# Patient Record
Sex: Male | Born: 1965 | Race: Black or African American | Hispanic: No | Smoking: Never smoker
Health system: Southern US, Community
[De-identification: ages and names within clinical notes are randomized; demographics above are authoritative.]

## PROBLEM LIST (undated history)

## (undated) DIAGNOSIS — N179 Acute kidney failure, unspecified: Secondary | ICD-10-CM

## (undated) DIAGNOSIS — I10 Essential (primary) hypertension: Secondary | ICD-10-CM

## (undated) DIAGNOSIS — Z87442 Personal history of urinary calculi: Secondary | ICD-10-CM

## (undated) DIAGNOSIS — Q2543 Congenital aneurysm of aorta: Secondary | ICD-10-CM

## (undated) DIAGNOSIS — N529 Male erectile dysfunction, unspecified: Secondary | ICD-10-CM

## (undated) DIAGNOSIS — Q278 Other specified congenital malformations of peripheral vascular system: Secondary | ICD-10-CM

## (undated) DIAGNOSIS — R079 Chest pain, unspecified: Secondary | ICD-10-CM

## (undated) DIAGNOSIS — N200 Calculus of kidney: Secondary | ICD-10-CM

## (undated) HISTORY — PX: NO PAST SURGERIES: SHX2092

## (undated) HISTORY — DX: Male erectile dysfunction, unspecified: N52.9

## (undated) HISTORY — PX: ROTATOR CUFF REPAIR: SHX139

## (undated) HISTORY — DX: Chest pain, unspecified: R07.9

## (undated) HISTORY — PX: KNEE ARTHROPLASTY: SHX992

## (undated) HISTORY — DX: Other specified congenital malformations of peripheral vascular system: Q27.8

## (undated) HISTORY — DX: Congenital aneurysm of aorta: Q25.43

---

## 2001-12-01 ENCOUNTER — Emergency Department (HOSPITAL_COMMUNITY): Admission: EM | Admit: 2001-12-01 | Discharge: 2001-12-02 | Payer: Self-pay | Admitting: Emergency Medicine

## 2001-12-02 ENCOUNTER — Encounter: Payer: Self-pay | Admitting: Emergency Medicine

## 2002-05-20 ENCOUNTER — Encounter: Payer: Self-pay | Admitting: Emergency Medicine

## 2002-05-20 ENCOUNTER — Emergency Department (HOSPITAL_COMMUNITY): Admission: EM | Admit: 2002-05-20 | Discharge: 2002-05-20 | Payer: Self-pay | Admitting: Emergency Medicine

## 2003-01-13 ENCOUNTER — Ambulatory Visit (HOSPITAL_COMMUNITY): Admission: RE | Admit: 2003-01-13 | Discharge: 2003-01-13 | Payer: Self-pay | Admitting: Internal Medicine

## 2003-01-13 ENCOUNTER — Encounter: Payer: Self-pay | Admitting: Internal Medicine

## 2004-09-01 ENCOUNTER — Ambulatory Visit (HOSPITAL_COMMUNITY): Admission: RE | Admit: 2004-09-01 | Discharge: 2004-09-01 | Payer: Self-pay | Admitting: Gastroenterology

## 2005-10-20 ENCOUNTER — Emergency Department (HOSPITAL_COMMUNITY): Admission: EM | Admit: 2005-10-20 | Discharge: 2005-10-20 | Payer: Self-pay | Admitting: Family Medicine

## 2012-08-14 ENCOUNTER — Encounter (HOSPITAL_BASED_OUTPATIENT_CLINIC_OR_DEPARTMENT_OTHER): Payer: Self-pay | Admitting: Anesthesiology

## 2012-08-14 ENCOUNTER — Encounter (HOSPITAL_BASED_OUTPATIENT_CLINIC_OR_DEPARTMENT_OTHER): Admission: RE | Payer: Self-pay | Source: Ambulatory Visit

## 2012-08-14 ENCOUNTER — Ambulatory Visit (HOSPITAL_BASED_OUTPATIENT_CLINIC_OR_DEPARTMENT_OTHER): Admission: RE | Admit: 2012-08-14 | Payer: Self-pay | Source: Ambulatory Visit | Admitting: Orthopedic Surgery

## 2012-08-14 SURGERY — EXCISION MASS
Anesthesia: Choice | Laterality: Right

## 2012-10-02 ENCOUNTER — Emergency Department (HOSPITAL_COMMUNITY)
Admission: EM | Admit: 2012-10-02 | Discharge: 2012-10-02 | Disposition: A | Discharge status: Home Health | Payer: BC Managed Care – PPO | Attending: Emergency Medicine | Admitting: Emergency Medicine

## 2012-10-02 ENCOUNTER — Emergency Department (HOSPITAL_COMMUNITY): Payer: BC Managed Care – PPO

## 2012-10-02 ENCOUNTER — Encounter (HOSPITAL_COMMUNITY): Payer: Self-pay | Admitting: Emergency Medicine

## 2012-10-02 DIAGNOSIS — Z87442 Personal history of urinary calculi: Secondary | ICD-10-CM | POA: Insufficient documentation

## 2012-10-02 DIAGNOSIS — Z79899 Other long term (current) drug therapy: Secondary | ICD-10-CM | POA: Insufficient documentation

## 2012-10-02 DIAGNOSIS — I10 Essential (primary) hypertension: Secondary | ICD-10-CM | POA: Insufficient documentation

## 2012-10-02 DIAGNOSIS — R42 Dizziness and giddiness: Secondary | ICD-10-CM | POA: Insufficient documentation

## 2012-10-02 DIAGNOSIS — R300 Dysuria: Secondary | ICD-10-CM | POA: Insufficient documentation

## 2012-10-02 DIAGNOSIS — R110 Nausea: Secondary | ICD-10-CM | POA: Insufficient documentation

## 2012-10-02 DIAGNOSIS — N209 Urinary calculus, unspecified: Secondary | ICD-10-CM

## 2012-10-02 HISTORY — DX: Calculus of kidney: N20.0

## 2012-10-02 HISTORY — DX: Essential (primary) hypertension: I10

## 2012-10-02 LAB — URINALYSIS, ROUTINE W REFLEX MICROSCOPIC
Bilirubin Urine: NEGATIVE
Glucose, UA: NEGATIVE mg/dL
Ketones, ur: NEGATIVE mg/dL
Nitrite: NEGATIVE
Protein, ur: NEGATIVE mg/dL
Specific Gravity, Urine: 1.025 (ref 1.005–1.030)
Urobilinogen, UA: 0.2 mg/dL (ref 0.0–1.0)
pH: 5.5 (ref 5.0–8.0)

## 2012-10-02 LAB — URINE MICROSCOPIC-ADD ON

## 2012-10-02 MED ORDER — ONDANSETRON HCL 8 MG PO TABS
4.0000 mg | ORAL_TABLET | Freq: Once | ORAL | Status: AC
  Administered 2012-10-02: 4 mg via ORAL
  Filled 2012-10-02: qty 2

## 2012-10-02 MED ORDER — KETOROLAC TROMETHAMINE 30 MG/ML IJ SOLN
30.0000 mg | Freq: Once | INTRAMUSCULAR | Status: AC
  Administered 2012-10-02: 30 mg via INTRAVENOUS

## 2012-10-02 MED ORDER — HYDROMORPHONE HCL PF 2 MG/ML IJ SOLN
2.0000 mg | Freq: Once | INTRAMUSCULAR | Status: DC
  Filled 2012-10-02: qty 1

## 2012-10-02 MED ORDER — IBUPROFEN 600 MG PO TABS: 600.0000 mg | ORAL_TABLET | Freq: Four times a day (QID) | ORAL | Status: DC | PRN

## 2012-10-02 MED ORDER — KETOROLAC TROMETHAMINE 30 MG/ML IJ SOLN
INTRAMUSCULAR | Status: AC
  Filled 2012-10-02: qty 1

## 2012-10-02 MED ORDER — IBUPROFEN 200 MG PO TABS
600.0000 mg | ORAL_TABLET | Freq: Once | ORAL | Status: AC
  Administered 2012-10-02: 600 mg via ORAL
  Filled 2012-10-02: qty 3

## 2012-10-02 MED ORDER — TAMSULOSIN HCL 0.4 MG PO CAPS: 0.4000 mg | ORAL_CAPSULE | Freq: Every day | ORAL | Status: DC

## 2012-10-02 MED ORDER — SODIUM CHLORIDE 0.9 % IV SOLN
INTRAVENOUS | Status: DC
  Administered 2012-10-02 (×2): via INTRAVENOUS

## 2012-10-02 MED ORDER — HYDROCODONE-ACETAMINOPHEN 5-325 MG PO TABS: 2.0000 | ORAL_TABLET | ORAL | Status: DC | PRN

## 2012-10-02 MED ORDER — CEPHALEXIN 500 MG PO CAPS: 500.0000 mg | ORAL_CAPSULE | Freq: Two times a day (BID) | ORAL | Status: DC

## 2012-10-02 MED ORDER — HYDROMORPHONE HCL PF 2 MG/ML IJ SOLN
2.0000 mg | Freq: Once | INTRAMUSCULAR | Status: AC
  Administered 2012-10-02: 2 mg via INTRAVENOUS
  Filled 2012-10-02: qty 1

## 2012-10-02 MED ORDER — HYDROCODONE-ACETAMINOPHEN 5-325 MG PO TABS
2.0000 | ORAL_TABLET | Freq: Once | ORAL | Status: AC
  Administered 2012-10-02: 2 via ORAL
  Filled 2012-10-02: qty 2

## 2012-10-02 NOTE — Initial Assessments (Signed)
1:08 PM Screening exam:  Pt with flank pain and hx kidney stone.  Rx Toradol 30 mg and Dilaudid 2 mg IV.  UA, CT of abd/pelvis without contrast ordered.

## 2012-10-02 NOTE — ED Notes (Signed)
Left flank pain x 1 hour feels nauseaous hurts to void

## 2012-10-02 NOTE — ED Notes (Signed)
Hx of kidney stone

## 2012-10-02 NOTE — ED Provider Notes (Signed)
History     CSN: [id number]  Arrival date & time 10/02/12  1237   First MD Initiated Contact with Patient 10/02/12 1342      No chief complaint on file.   (Consider location/radiation/quality/duration/timing/severity/associated sxs/prior treatment) HPI Comments: Pt comes in with cc of abd pain. Pt has hx of renal stones, and states that he started having acute pain on his left flank this afternoon, which has now radiated to the left groin. Pt had some nausea, no emesis, no fevers, chills, and MAY BE some dysuria without hematuria, polyuria. Pt has hx of renal stones in the past that he passed, similar complains at that time.  The history is provided by the patient.    Past Medical History  Diagnosis Date  . Kidney stone   . Hypertension     No past surgical history on file.  No family history on file.  History  Substance Use Topics  . Smoking status: Never Smoker   . Smokeless tobacco: Not on file  . Alcohol Use: Yes      Review of Systems  Constitutional: Negative for fever, chills and activity change.  HENT: Negative for neck pain.   Eyes: Negative for visual disturbance.  Respiratory: Negative for cough, chest tightness and shortness of breath.   Cardiovascular: Negative for chest pain.  Gastrointestinal: Positive for abdominal pain. Negative for abdominal distention.  Genitourinary: Positive for dysuria. Negative for enuresis and difficulty urinating.  Musculoskeletal: Negative for arthralgias.  Neurological: Positive for dizziness. Negative for light-headedness and headaches.  Psychiatric/Behavioral: Negative for confusion.    Allergies  Sulfa antibiotics  Home Medications   Current Outpatient Rx  Name  Route  Sig  Dispense  Refill  . AMLODIPINE BESYLATE 10 MG PO TABS   Oral   Take 10 mg by mouth daily.         Marland Kitchen LISINOPRIL-HYDROCHLOROTHIAZIDE 20-25 MG PO TABS   Oral   Take 1 tablet by mouth daily.           BP 150/99  Pulse 76  Temp  97.1 F (36.2 C) (Oral)  Resp 18  SpO2 98%  Physical Exam  Nursing note and vitals reviewed. Constitutional: He is oriented to person, place, and time. He appears well-developed.  HENT:  Head: Normocephalic and atraumatic.  Eyes: Conjunctivae normal and EOM are normal. Pupils are equal, round, and reactive to light.  Neck: Normal range of motion. Neck supple.  Cardiovascular: Normal rate, regular rhythm and normal heart sounds.   Pulmonary/Chest: Effort normal and breath sounds normal. No respiratory distress. He has no wheezes.  Abdominal: Soft. Bowel sounds are normal. He exhibits no distension. There is no tenderness. There is no rebound and no guarding.  Neurological: He is alert and oriented to person, place, and time.  Skin: Skin is warm.    ED Course  Procedures (including critical care time)  Labs Reviewed  URINALYSIS, ROUTINE W REFLEX MICROSCOPIC - Abnormal; Notable for the following:    Hgb urine dipstick MODERATE (*)     Leukocytes, UA TRACE (*)     All other components within normal limits  URINE MICROSCOPIC-ADD ON - Abnormal; Notable for the following:    Squamous Epithelial / LPF FEW (*)     Casts HYALINE CASTS (*)     All other components within normal limits  URINE CULTURE   Ct Abdomen Pelvis Wo Contrast  10/02/2012  *RADIOLOGY REPORT*  Clinical Data: Left flank pain and dysuria.  CT ABDOMEN AND PELVIS  WITHOUT CONTRAST  Technique:  Multidetector CT imaging of the abdomen and pelvis was performed following the standard protocol without intravenous contrast.  Comparison: 03/04/2011  Findings: There is eight 2-3 mm stone at the left ureterovesicular junction causing mild left hydroureter nephrosis.  No intrarenal stones.  No renal masses.  The right renal collecting system and ureter are normal.  Clear lung bases.  The heart is normal in size.  Liver, spleen, gallbladder and pancreas are unremarkable.  No adrenal masses.  No adenopathy.  No abnormal fluid collections.   There are a few right colon diverticula without diverticulitis.  The bowel is otherwise unremarkable.  Normal appendix.  Mild degenerative changes of the lumbar spine.  No other bony abnormality.  IMPRESSION: 2-3 mm stone at the left ureterovesicular junction causing mild left hydroureteronephrosis.   Original Report Authenticated By: Amie Portland, M.D.      No diagnosis found.    MDM  Pt comes in with cc of acute left flank pain that has moved to the groin. No scrotal tenderness. Has hx of similar sx in the past with renal stones.  Pt has some dysuria, so we will check UA, and CT renal stones.  3:15 PM UA is not showing clear infection, renal stone measures 2mm. We will give a Urology followup. With the dysuria complains, and the fact that [place] is 80-85% sensitive, cultures has been send, and we will give him a 5 day keflex dose just to be safe.          Derwood Kaplan, MD 10/02/12 [phone]

## 2012-10-03 LAB — URINE CULTURE: Colony Count: 40000

## 2012-10-04 NOTE — ED Notes (Signed)
+   Urine Patient treated with Keflex-sensitive to same-chart sent to EDP Office for review.

## 2013-07-18 ENCOUNTER — Encounter (HOSPITAL_COMMUNITY): Payer: Self-pay | Admitting: Emergency Medicine

## 2013-07-18 ENCOUNTER — Emergency Department (HOSPITAL_COMMUNITY)
Admission: EM | Admit: 2013-07-18 | Discharge: 2013-07-19 | Disposition: A | Discharge status: Home / Self Care | Payer: BC Managed Care – PPO | Attending: Emergency Medicine | Admitting: Emergency Medicine

## 2013-07-18 ENCOUNTER — Emergency Department (HOSPITAL_COMMUNITY): Payer: BC Managed Care – PPO

## 2013-07-18 DIAGNOSIS — Z87442 Personal history of urinary calculi: Secondary | ICD-10-CM | POA: Insufficient documentation

## 2013-07-18 DIAGNOSIS — R079 Chest pain, unspecified: Secondary | ICD-10-CM

## 2013-07-18 DIAGNOSIS — I10 Essential (primary) hypertension: Secondary | ICD-10-CM | POA: Insufficient documentation

## 2013-07-18 DIAGNOSIS — Z79899 Other long term (current) drug therapy: Secondary | ICD-10-CM | POA: Insufficient documentation

## 2013-07-18 LAB — BASIC METABOLIC PANEL
BUN: 18 mg/dL (ref 6–23)
CO2: 24 mEq/L (ref 19–32)
Calcium: 9 mg/dL (ref 8.4–10.5)
Chloride: 105 mEq/L (ref 96–112)
Creatinine, Ser: 1.49 mg/dL — ABNORMAL HIGH (ref 0.50–1.35)
Glucose, Bld: 91 mg/dL (ref 70–99)

## 2013-07-18 LAB — CBC
HCT: 46.4 % (ref 39.0–52.0)
MCH: 30.6 pg (ref 26.0–34.0)
MCV: 85.1 fL (ref 78.0–100.0)
Platelets: 201 10*3/uL (ref 150–400)
RBC: 5.45 MIL/uL (ref 4.22–5.81)
WBC: 3.3 10*3/uL — ABNORMAL LOW (ref 4.0–10.5)

## 2013-07-18 LAB — POCT I-STAT TROPONIN I: Troponin i, poc: 0.02 ng/mL (ref 0.00–0.08)

## 2013-07-18 NOTE — ED Notes (Signed)
Onset today 2100 while driving and developed sudden onset of left side of chest pain 4/10 achy EMS called gave 2 nitrol SL and aspirin 324mg  PO.  Pain improved to 2/10 achy. Denies shortness of breath.

## 2013-07-18 NOTE — ED Provider Notes (Signed)
CSN: [id number]     Arrival date & time 07/18/13  2135 History   First MD Initiated Contact with Patient 07/18/13 2159     Chief Complaint  Patient presents with  . Chest Pain   (Consider location/radiation/quality/duration/timing/severity/associated sxs/prior Treatment) HPI Comments: Patient is a 47 year old male with a past medical history of hypertension who presents with chest pain that occurred prior to arrival. Patient reports driving in his car and having sudden onset of left chest pain that did not radiate. The pain was aching and severe. The pain lasted about 10 minutes before spontaneously resolving. Patient denies any associated symptoms during the chest pain episode. Patient has no family history of early heart disease. Patient is a non smoker. No previous DVT/PE, recent travel or surgery.    Past Medical History  Diagnosis Date  . Kidney stone   . Hypertension    History reviewed. No pertinent past surgical history. No family history on file. History  Substance Use Topics  . Smoking status: Never Smoker   . Smokeless tobacco: Not on file  . Alcohol Use: Yes    Review of Systems  Cardiovascular: Positive for chest pain.  All other systems reviewed and are negative.    Allergies  Sulfa antibiotics  Home Medications   Current Outpatient Rx  Name  Route  Sig  Dispense  Refill  . amLODipine (NORVASC) 10 MG tablet   Oral   Take 10 mg by mouth daily.         Marland Kitchen ibuprofen (ADVIL,MOTRIN) 200 MG tablet   Oral   Take 400 mg by mouth every 6 (six) hours as needed for pain.         Marland Kitchen lisinopril-hydrochlorothiazide (PRINZIDE,ZESTORETIC) 20-25 MG per tablet   Oral   Take 1 tablet by mouth daily.         . Tetrahydrozoline HCl (VISINE OP)   Both Eyes   Place 2 drops into both eyes as needed (dry eyes).          BP 122/78  Pulse 74  Temp(Src) 97.6 F (36.4 C) (Oral)  Resp 16  SpO2 96% Physical Exam  Nursing note and vitals reviewed. Constitutional: He  is oriented to person, place, and time. He appears well-developed and well-nourished. No distress.  HENT:  Head: Normocephalic and atraumatic.  Eyes: Conjunctivae and EOM are normal.  Neck: Normal range of motion.  Cardiovascular: Normal rate and regular rhythm.  Exam reveals no gallop and no friction rub.   No murmur heard. Pulmonary/Chest: Effort normal and breath sounds normal. He has no wheezes. He has no rales. He exhibits no tenderness.  Abdominal: Soft. He exhibits no distension. There is no tenderness. There is no rebound and no guarding.  Musculoskeletal: Normal range of motion.  Neurological: He is alert and oriented to person, place, and time. Coordination normal.  Speech is goal-oriented. Moves limbs without ataxia.   Skin: Skin is warm and dry.  Psychiatric: He has a normal mood and affect. His behavior is normal.    ED Course  Procedures (including critical care time)   Date: 07/18/2013  Rate: 78  Rhythm: normal sinus rhythm  QRS Axis: normal  Intervals: PR prolonged  ST/T Wave abnormalities: nonspecific T wave changes  Conduction Disutrbances:none  Narrative Interpretation: NSR with nonspecific T wave changes  Old EKG Reviewed: none available    Labs Review Labs Reviewed  CBC - Abnormal; Notable for the following:    WBC 3.3 (*)  All other components within normal limits  BASIC METABOLIC PANEL - Abnormal; Notable for the following:    Potassium 3.2 (*)    Creatinine, Ser 1.49 (*)    GFR calc non Af Amer 54 (*)    GFR calc Af Amer 62 (*)    All other components within normal limits  POCT I-STAT TROPONIN I   Imaging Review Dg Chest Port 1 View  07/18/2013   *RADIOLOGY REPORT*  Clinical Data: Chest pain  PORTABLE CHEST - 1 VIEW  Comparison: None available  Findings: Cardiac and mediastinal silhouettes are within normal limits.  The lungs are clear without airspace consolidation, pleural effusion, or pulmonary edema.  Costophrenic angles incompletely  visualized.  Mild irregular apical thickening is present right lung apex.  No pulmonary nodule or mass.  No pneumothorax.  No acute osseous abnormality.  IMPRESSION: No acute cardiopulmonary process.   Original Report Authenticated By: Rise Mu, M.D.    MDM   1. Chest pain     10:55 PM Labs unremarkable. Troponin negative. Chest xray unremarkable. Vitals stable and patient afebrile. EKG shows nonspecific T wave changes. Patient will have delta troponin and I will consult [facility]. Patient is PERC negative. Dissection not likely.   12:07 AM I spoke with the on call Cardiologist who is comfortable with a repeat troponin and follow up in the office. Patient will have troponin repeated and if negative, patient will be discharged. If positive, patient will stay.   1:03 AM Second troponin negative. Patient will be discharged.     Emilia Beck, PA-C 07/19/13 [phone]

## 2013-07-19 LAB — POCT I-STAT TROPONIN I

## 2013-07-20 ENCOUNTER — Other Ambulatory Visit: Payer: Self-pay | Admitting: Interventional Cardiology

## 2013-07-20 ENCOUNTER — Ambulatory Visit
Admission: RE | Admit: 2013-07-20 | Discharge: 2013-07-20 | Disposition: A | Discharge status: Home / Self Care | Payer: BC Managed Care – PPO | Source: Ambulatory Visit | Attending: Interventional Cardiology | Admitting: Interventional Cardiology

## 2013-07-20 DIAGNOSIS — I7100 Dissection of unspecified site of aorta: Secondary | ICD-10-CM

## 2013-07-20 DIAGNOSIS — I2510 Atherosclerotic heart disease of native coronary artery without angina pectoris: Secondary | ICD-10-CM

## 2013-07-20 MED ORDER — IOHEXOL 350 MG/ML SOLN
100.0000 mL | Freq: Once | INTRAVENOUS | Status: AC | PRN
  Administered 2013-07-20: 100 mL via INTRAVENOUS

## 2013-07-21 NOTE — ED Provider Notes (Signed)
Medical screening examination/treatment/procedure(s) were performed by non-physician practitioner and as supervising physician I was immediately available for consultation/collaboration.  Candyce Churn, MD 07/21/13 [phone]

## 2013-08-14 ENCOUNTER — Encounter: Payer: Self-pay | Admitting: *Deleted

## 2013-08-14 DIAGNOSIS — R079 Chest pain, unspecified: Secondary | ICD-10-CM | POA: Insufficient documentation

## 2013-08-14 DIAGNOSIS — Q2543 Congenital aneurysm of aorta: Secondary | ICD-10-CM | POA: Insufficient documentation

## 2013-08-14 DIAGNOSIS — Q278 Other specified congenital malformations of peripheral vascular system: Secondary | ICD-10-CM | POA: Insufficient documentation

## 2013-08-14 DIAGNOSIS — I10 Essential (primary) hypertension: Secondary | ICD-10-CM | POA: Insufficient documentation

## 2013-08-14 DIAGNOSIS — N200 Calculus of kidney: Secondary | ICD-10-CM | POA: Insufficient documentation

## 2013-08-14 DIAGNOSIS — N529 Male erectile dysfunction, unspecified: Secondary | ICD-10-CM | POA: Insufficient documentation

## 2013-08-15 ENCOUNTER — Encounter: Payer: Self-pay | Admitting: Surgery

## 2013-08-15 ENCOUNTER — Institutional Professional Consult (permissible substitution) (INDEPENDENT_AMBULATORY_CARE_PROVIDER_SITE_OTHER): Payer: BC Managed Care – PPO | Admitting: Surgery

## 2013-08-15 VITALS — BP 140/93 | HR 96 | Resp 20 | Ht 71.0 in | Wt 244.0 lb

## 2013-08-15 DIAGNOSIS — I77810 Thoracic aortic ectasia: Secondary | ICD-10-CM

## 2013-08-17 ENCOUNTER — Encounter: Payer: Self-pay | Admitting: Surgery

## 2013-08-17 NOTE — Progress Notes (Signed)
[address]       [place] [address]             [phone]      Cardiothoracic Surgery Consultation  PCP is Lupe Carney, MD Referring Provider is Verdis Prime, MD   Chief Complaint  Patient presents with  . Thoracic Aortic Aneurysm    Surgical eval on moderate aortic root dilation, Chest CTA 07/20/13, ECHO 08/10/13     HPI:  The patient is a generally healthy, active 47 year old with HTN who reports driving in his car and having sudden sharp pain in his left upper chest. It became more severe and lasted about 10 minutes and resolved. There was no radiation and no associated symptoms. His wife made him go to the ER. ECG and troponin x 2 were negative. He was referred to Dr. Katrinka Blazing for followup. A CTA of the chest showed dilatation of the aortic root with a sinus of valsalva diameter of 48 mm. 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  He had an echo on 08/10/2013 that showed mild dilatation of the aortic sinuses to 46 mm with normal tricuspid aortic valve function and normal LV. An echo in 2012 reportedly showed a sinus diameter of 46 mm. The patient says he has had no further episodes of this pain but has not been exercising since being told about this.  Past Medical History  Diagnosis Date  . Erectile dysfunction     DR. NESI  . Hypertension   . Kidney stone   . Chest pain CT ANGIO CHEST 07/20/13    LED TO DISCOVERY OF SINUS OF VALSALVA  ANEURYSM  . 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 Aberrant subclavian artery     RIGHT...PER CT CHEST...07/20/13    History reviewed. No pertinent past surgical history.  Family History  Problem Relation Age of Onset  . Cancer Mother     BREAST CANCER  . Cancer Father     MUTIPLE MYELOMA  . Hypertension Father   . Cancer Brother     ? TYPE    Social History History  Substance Use Topics  . Smoking status: Never Smoker   .  Smokeless tobacco: Not on file  . Alcohol Use: Yes     Comment: RARE    Current Outpatient Prescriptions  Medication Sig Dispense Refill  . amLODipine (NORVASC) 10 MG tablet Take 10 mg by mouth daily.      Marland Kitchen aspirin EC 81 MG tablet Take 81 mg by mouth daily.      Marland Kitchen ibuprofen (ADVIL,MOTRIN) 200 MG tablet Take 400 mg by mouth every 6 (six) hours as needed for pain.      Marland Kitchen lisinopril-hydrochlorothiazide (PRINZIDE,ZESTORETIC) 20-25 MG per tablet Take 1 tablet by mouth daily.      . tadalafil (CIALIS) 20 MG tablet Take 20 mg by mouth daily as needed for erectile dysfunction.      . Tetrahydrozoline HCl (VISINE OP) Place 2 drops into both eyes as needed (dry eyes).       No current facility-administered medications for this visit.    Allergies  Allergen Reactions  . Sulfa Antibiotics Rash  . Viagra [Sildenafil Citrate] Other (See Comments)    FLUSHING     Review of Systems  Constitutional: Negative.   HENT: Negative.   Eyes: Negative.   Respiratory: Negative.   Cardiovascular: Positive  for chest pain.       Only one episode as noted above.  Gastrointestinal: Negative.   Endocrine: Negative.   Genitourinary: Negative.   Musculoskeletal: Negative.   Skin: Negative.   Allergic/Immunologic: Negative.   Neurological: Negative.   Hematological: Negative.   Psychiatric/Behavioral: Negative.     BP 140/93  Pulse 96  Resp 20  Ht 5\' 11"  (1.803 m)  Wt 244 lb (110.678 kg)  BMI 34.05 kg/m2  SpO2 98% Physical Exam  Constitutional: He is oriented to person, place, and time. He appears well-developed and well-nourished. No distress.  HENT:  Head: Normocephalic and atraumatic.  Mouth/Throat: Oropharynx is clear and moist.  Eyes: Conjunctivae and EOM are normal. Pupils are equal, round, and reactive to light.  Neck: Normal range of motion. Neck supple. No JVD present. No tracheal deviation present. No thyromegaly present.  Cardiovascular: Normal rate, regular rhythm and intact distal  pulses.  Exam reveals no gallop and no friction rub.   No murmur heard. Pulmonary/Chest: Effort normal and breath sounds normal. No respiratory distress. He has no rales.  Abdominal: Soft. Bowel sounds are normal. He exhibits no distension and no mass. There is no tenderness.  Musculoskeletal: Normal range of motion. He exhibits no edema.  Lymphadenopathy:    He has no cervical adenopathy.  Neurological: He is alert and oriented to person, place, and time. He has normal strength. No cranial nerve deficit or sensory deficit.  Skin: Skin is warm and dry.  Psychiatric: He has a normal mood and affect.     Diagnostic Tests:  Wynn Banker, MD Caleen Essex Jul 20, 2013 1:57:51 PM EDT       **ADDENDUM** CREATED: 07/20/2013 13:54:04  IMPRESSION:  1. Sinus of Valsalva aneurysm measuring 48 mm. Echocardiography  recommended to further assess competency of the aortic valve. The  thoracic aorta is tortuous but not aneurysmally dilated and this  tortuosity can be associated with aortic insufficiency.  2. No coronary artery calcium identified grossly.  3. 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  **END ADDENDUM** SIGNED BY: Andreas Newport, M.D.      Study Result    *RADIOLOGY REPORT*  Clinical Data: Severe chest pain. Coronary artery calcification.  Thoracic aortic aneurysm or dissection.  CT ANGIOGRAPHY CHEST  Technique: Multidetector CT imaging of the chest using the  standard protocol during bolus administration of intravenous  contrast. Multiplanar reconstructed images including MIPs were  obtained and reviewed to evaluate the vascular anatomy.  Contrast: OMNIPAQUE IOHEXOL 350 MG/ML SOLN  Comparison: Chest radiograph 07/19/2003 team.  Findings: Sinus Valsalva aneurysm is present measuring 48 mm on  coronal imaging. There is a three-vessel aortic arch with an  aberrant right subclavian artery passing retro  esophageal. 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 This is aberrant vein extends from the left renal vein,  mirroring the azygos vein in the mediastinum and connects with the  left brachiocephalic vein. There is a left brachiocephalic vein  proper which appears patent with mass effect from the tortuous  thoracic aortic arch.  There is no gross central pulmonary embolus. No ascending or  descending thoracic aortic aneurysm. No pericardial or pleural  effusion. Incidental imaging of the upper abdomen is within normal  limits aside from a tiny low density lesion in the inferior right  hepatic lobe probably representing cysts. Heart size is within  normal limits. There is no axillary  adenopathy. No mediastinal or  hilar adenopathy. The lung windows show scattered areas of  atelectasis. No focal consolidation. Central airways are patent.  Mild thoracic spondylosis. No aggressive osseous lesions.  IMPRESSION:  1. Sinus of Valsalva aneurysm measuring 48 mm. Echocardiography  recommended to further assess competency of the aortic valve. The  thoracic aorta is tortuous but not aneurysmally dilated and this  tortuosity can be associated with aortic insufficiency.  2. No coronary artery calcium identified grossly.  3. 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  Original Report Authenticated By: Andreas Newport, M.D.    Impression:  He has mild dilatation of the aortic root which does not look significant given the patient's large frame and the size of the remaining aorta. He has a normal tricuspid aortic valve and no family hx of valve or aortic disease. I would recommend continued followup with a CTA in 1 year. I don't think that his chest pain had anything to do with this. I reviewed the echo and CT scan with him and answered all of his questions. I discussed the importance of good  blood pressure control. He would probably benefit from a B-Blocker if tolerated.  Plan:  He will return to see me in 1 year with a CTA of the chest.

## 2013-09-17 ENCOUNTER — Encounter: Payer: Self-pay | Admitting: Interventional Cardiology

## 2013-11-27 ENCOUNTER — Ambulatory Visit: Payer: BC Managed Care – PPO | Admitting: Interventional Cardiology

## 2013-12-25 ENCOUNTER — Encounter: Payer: Self-pay | Admitting: Interventional Cardiology

## 2013-12-25 ENCOUNTER — Ambulatory Visit (INDEPENDENT_AMBULATORY_CARE_PROVIDER_SITE_OTHER): Payer: BC Managed Care – PPO | Admitting: Interventional Cardiology

## 2013-12-25 VITALS — BP 158/108 | HR 105 | Ht 72.0 in | Wt 256.0 lb

## 2013-12-25 DIAGNOSIS — Q2543 Congenital aneurysm of aorta: Secondary | ICD-10-CM

## 2013-12-25 DIAGNOSIS — I10 Essential (primary) hypertension: Secondary | ICD-10-CM

## 2013-12-25 DIAGNOSIS — R079 Chest pain, unspecified: Secondary | ICD-10-CM

## 2013-12-25 DIAGNOSIS — Q254 Other congenital malformations of aorta: Secondary | ICD-10-CM

## 2013-12-25 MED ORDER — LISINOPRIL-HYDROCHLOROTHIAZIDE 20-25 MG PO TABS: 1.0000 | ORAL_TABLET | Freq: Every day | ORAL | Status: DC

## 2013-12-25 MED ORDER — METOPROLOL SUCCINATE ER 25 MG PO TB24: 25.0000 mg | ORAL_TABLET | Freq: Every day | ORAL | Status: DC

## 2013-12-25 MED ORDER — AMLODIPINE BESYLATE 10 MG PO TABS: 10.0000 mg | ORAL_TABLET | Freq: Every day | ORAL | Status: DC

## 2013-12-25 NOTE — Patient Instructions (Signed)
Your physician has recommended you make the following change in your medication:   1) Start Metoprolol Succinate 25mg  daily. An Rx has been sent to your pharmacy  Take all other medications as prescribed. All you cardiac related medications have been refilled today  Your physician wants you to follow-up in: 6-8 months You will receive a reminder letter in the mail two months in advance. If you don't receive a letter, please call our office to schedule the follow-up appointment.

## 2013-12-25 NOTE — Progress Notes (Signed)
Patient ID: Bryan Riggs Nemmers, male   DOB: 11-21-1965, 48 y.o.   MRN: [id number]    [address] [place], [place]  [id number] Phone: [phone] Fax:  [phone]  Date:  12/25/2013   [name] [place], DOB 11-21-1965, MRN [id number]  PCP:  Lupe CarneyMITCHELL,DEAN, MD   ASSESSMENT:  1. 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 Elevated blood pressure with poor control. Has missed several days of therapy 3. Erectile dysfunction  PLAN:  1. Metoprolol succinate 25 mg daily added to his current medical regimen. All antihypertensive prescriptions are refilled for one year 2. Clinical followup in 6 months 3. Eliminate isometric exercise but cleared to engage in aerobic activity   SUBJECTIVE: Bryan Riggs Y[place] is a 48 y.o. male complaining of occasional chest discomfort that is fleeting. The discomfort is nonexertional. There is some shortness of breath that he feels is related to weight gain and deconditioning. Since finding out about the enlargement of the aorta, he has been concerned about exercising.   Wt Readings from Last 3 Encounters:  12/25/13 256 lb (116.121 kg)  08/15/13 244 lb (110.678 kg)     Past Medical History  Diagnosis Date  . Erectile dysfunction     DR. NESI  . Hypertension   . Kidney stone   . Chest pain CT ANGIO CHEST 07/20/13    LED TO DISCOVERY OF SINUS OF VALSALVA  ANEURYSM  . 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 Aberrant subclavian artery     RIGHT...PER CT CHEST...07/20/13    Current Outpatient Prescriptions  Medication Sig Dispense Refill  . amLODipine (NORVASC) 10 MG tablet Take 10 mg by mouth daily.      Marland Kitchen. lisinopril-hydrochlorothiazide (PRINZIDE,ZESTORETIC) 20-25 MG per tablet Take 1 tablet by mouth daily.      . Tetrahydrozoline HCl (VISINE OP) Place 2 drops into both eyes as needed (dry eyes).       No current  facility-administered medications for this visit.    Allergies:    Allergies  Allergen Reactions  . Sulfa Antibiotics Rash    Break out, itching  . Viagra [Sildenafil Citrate] Other (See Comments)    FLUSHING     Social History:  The patient  reports that he has never smoked. He does not have any smokeless tobacco history on file. He reports that he drinks alcohol.   ROS:  Please see the history of present illness.   He wrecked out dysfunction makes him leery of taking antihypertensive therapy. He has not had his blood pressure medicine for 2 days at this point. He is been frightened of exercising. He has gained significant weight.   All other systems reviewed and negative.   OBJECTIVE: VS:  BP 158/108  Pulse 105  Ht 6' (1.829 m)  Wt 256 lb (116.121 kg)  BMI 34.71 kg/m2 Well nourished, well developed, in no acute distress, younger than stated age HEENT: normal Neck: JVD flat. Carotid bruit absent  Cardiac:  normal S1, S2; RRR; no murmur Lungs:  clear to auscultation bilaterally, no wheezing, rhonchi or rales Abd: soft, nontender, no hepatomegaly Ext: Edema absent. Pulses 2+ and symmetric Skin: warm and dry Neuro:  CNs 2-12 intact, no focal abnormalities noted  EKG:  Not repeated       Signed, Darci NeedleHenry W. B. Masae Lukacs III, MD 12/25/2013 10:54 AM

## 2014-01-02 ENCOUNTER — Telehealth: Payer: Self-pay | Admitting: *Deleted

## 2014-01-02 DIAGNOSIS — N529 Male erectile dysfunction, unspecified: Secondary | ICD-10-CM

## 2014-01-02 NOTE — Telephone Encounter (Signed)
Viagra 100 mg tabs, # 15 with 12 refills

## 2014-01-02 NOTE — Telephone Encounter (Signed)
Patient requests viagra refill. He stated that Dr Katrinka BlazingSmith told him that if he needed any to just give him a call. Please advise. Thanks, MI

## 2014-01-03 MED ORDER — SILDENAFIL CITRATE 100 MG PO TABS: 100.0000 mg | ORAL_TABLET | Freq: Every day | ORAL | Status: DC | PRN

## 2014-01-03 NOTE — Telephone Encounter (Signed)
Done

## 2014-01-30 ENCOUNTER — Encounter: Payer: Self-pay | Admitting: Interventional Cardiology

## 2014-05-20 ENCOUNTER — Telehealth: Payer: Self-pay | Admitting: *Deleted

## 2014-05-20 NOTE — Telephone Encounter (Signed)
[place] [employer] viagra

## 2014-07-16 ENCOUNTER — Ambulatory Visit: Payer: BC Managed Care – PPO | Admitting: Interventional Cardiology

## 2014-07-24 ENCOUNTER — Encounter: Payer: Self-pay | Admitting: Interventional Cardiology

## 2014-07-24 ENCOUNTER — Ambulatory Visit (INDEPENDENT_AMBULATORY_CARE_PROVIDER_SITE_OTHER): Payer: BC Managed Care – PPO | Admitting: Interventional Cardiology

## 2014-07-24 VITALS — BP 150/110 | HR 76 | Ht 72.0 in | Wt 259.0 lb

## 2014-07-24 DIAGNOSIS — I10 Essential (primary) hypertension: Secondary | ICD-10-CM

## 2014-07-24 DIAGNOSIS — Q278 Other specified congenital malformations of peripheral vascular system: Secondary | ICD-10-CM

## 2014-07-24 DIAGNOSIS — Q254 Other congenital malformations of aorta: Secondary | ICD-10-CM

## 2014-07-24 DIAGNOSIS — Q2543 Congenital aneurysm of aorta: Secondary | ICD-10-CM

## 2014-07-24 MED ORDER — METOPROLOL SUCCINATE ER 100 MG PO TB24: ORAL_TABLET | ORAL | Status: DC

## 2014-07-24 NOTE — Progress Notes (Addendum)
Patient ID: Bryan Riggs, male   DOB: 1966-01-28, 48 y.o.   MRN: [id number]    [address], [address] [place], [place]  [address] Phone: [phone] Fax:  [phone]  Date:  07/24/2014   [name], DOB 1966-11-09, MRN [id number]  PCP:  Bryan Carney, MD   ASSESSMENT:  1. sinus of Valsalva aneurysm, clinically being followed 2. Severe and poorly controlled blood pressure 3. Recurring left chest pain, nonexertional  PLAN:  1. increase metoprolol succinate 50 mg daily. 2. Record blood pressures at home and if pressures remain above 150/90, will increase metoprolol to 100 mg per day after 7-10 days of 50 mg per day. 3. If blood pressure remains high after optimizing beta blocker therapy, we will increase lisinopril HCT to 40/25 mg per day, in the form of 20/12.5 mg tablets twice a day 4. Clinical followup in 2-3 months   SUBJECTIVE: Bryan Riggs is a 48 y.o. male feels relatively well. He has not been exercising. He has a sense of impending doom. States he has taken his blood pressure medications but always as prescribed. He is having intermittent aching in his chest. This is similar to that which led to aortic evaluation a year ago. The discomfort is nonexertional. He denies orthopnea PND.   Wt Readings from Last 3 Encounters:  07/24/14 259 lb (117.482 kg)  12/25/13 256 lb (116.121 kg)  08/15/13 244 lb (110.678 kg)     Past Medical History  Diagnosis Date  . Erectile dysfunction     DR. NESI  . Hypertension   . Kidney stone   . Chest pain CT ANGIO CHEST 07/20/13    LED TO DISCOVERY OF SINUS OF VALSALVA  ANEURYSM  . 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 Aberrant subclavian artery     RIGHT...PER CT CHEST...07/20/13    Current Outpatient Prescriptions  Medication Sig Dispense Refill  . amLODipine (NORVASC) 10 MG tablet Take 1 tablet (10 mg total) by mouth daily.  30 tablet  11  .  lisinopril-hydrochlorothiazide (PRINZIDE,ZESTORETIC) 20-25 MG per tablet Take 1 tablet by mouth daily.  30 tablet  11  . metoprolol succinate (TOPROL XL) 25 MG 24 hr tablet Take 1 tablet (25 mg total) by mouth daily.  30 tablet  11  . sildenafil (VIAGRA) 100 MG tablet Take 1 tablet (100 mg total) by mouth daily as needed for erectile dysfunction.  15 tablet  12  . Tetrahydrozoline HCl (VISINE OP) Place 2 drops into both eyes as needed (dry eyes).       No current facility-administered medications for this visit.    Allergies:    Allergies  Allergen Reactions  . Sulfa Antibiotics Rash and Nausea And Vomiting    Break out, itching  . Viagra [Sildenafil Citrate] Other (See Comments)    FLUSHING     Social History:  The patient  reports that he has never smoked. He does not have any smokeless tobacco history on file. He reports that he drinks alcohol.   ROS:  Please see the history of present illness.   Depressed   All other systems reviewed and negative.   OBJECTIVE: VS:  BP 150/110  Pulse 76  Ht 6' (1.829 m)  Wt 259 lb (117.482 kg)  BMI 35.12 kg/m2 Well nourished, well developed, in no acute distress, he has gained weight. HEENT: normal Neck: JVD flat. Carotid bruit absent  Cardiac:  normal S1, S2; RRR; no murmur Lungs:  clear to auscultation bilaterally, no wheezing, rhonchi or rales Abd: soft, nontender, no hepatomegaly Ext: Edema absent. Pulses 2+ Skin: warm and dry Neuro:  CNs 2-12 intact, no focal abnormalities noted  EKG:  Normal sinus rhythm with first-degree AV block   . Left ventricular hypertrophy. Poor R-wave progression. First degree AV block. Biatrial abnormality. Left axis deviation.    Signed, Darci Needle III, MD 07/24/2014 2:16 PM

## 2014-07-24 NOTE — Patient Instructions (Signed)
Your physician has recommended you make the following change in your medication:  1) INCREASE Metoprolol to  daily  Monitor your blood pressure at home for 10 days. Call the office with bp readings [phone]  Your physician recommends that you schedule a follow-up appointment in: 3 months

## 2014-08-06 ENCOUNTER — Other Ambulatory Visit: Payer: Self-pay | Admitting: *Deleted

## 2014-08-06 DIAGNOSIS — I77810 Thoracic aortic ectasia: Secondary | ICD-10-CM

## 2014-09-04 ENCOUNTER — Ambulatory Visit: Payer: BC Managed Care – PPO | Admitting: Surgery

## 2014-09-10 ENCOUNTER — Other Ambulatory Visit: Payer: Self-pay | Admitting: *Deleted

## 2014-09-10 DIAGNOSIS — Q2543 Congenital aneurysm of aorta: Secondary | ICD-10-CM

## 2014-09-10 LAB — CREATININE, SERUM: Creat: 1.5 mg/dL — ABNORMAL HIGH (ref 0.50–1.35)

## 2014-09-10 LAB — BUN: BUN: 17 mg/dL (ref 6–23)

## 2014-09-11 ENCOUNTER — Ambulatory Visit
Admission: RE | Admit: 2014-09-11 | Discharge: 2014-09-11 | Disposition: A | Discharge status: Home / Self Care | Payer: No Typology Code available for payment source | Source: Ambulatory Visit | Attending: Surgery | Admitting: Surgery

## 2014-09-11 ENCOUNTER — Encounter: Payer: Self-pay | Admitting: Surgery

## 2014-09-11 ENCOUNTER — Ambulatory Visit (INDEPENDENT_AMBULATORY_CARE_PROVIDER_SITE_OTHER): Payer: BC Managed Care – PPO | Admitting: Surgery

## 2014-09-11 VITALS — BP 126/91 | HR 66 | Ht 72.0 in | Wt 244.0 lb

## 2014-09-11 DIAGNOSIS — Q254 Other congenital malformations of aorta: Secondary | ICD-10-CM

## 2014-09-11 DIAGNOSIS — Q2543 Congenital aneurysm of aorta: Secondary | ICD-10-CM

## 2014-09-11 DIAGNOSIS — I77810 Thoracic aortic ectasia: Secondary | ICD-10-CM

## 2014-09-11 MED ORDER — IOHEXOL 350 MG/ML SOLN
65.0000 mL | Freq: Once | INTRAVENOUS | Status: AC | PRN
Start: 2014-09-11 — End: 2014-09-11
  Administered 2014-09-11: 65 mL via INTRAVENOUS

## 2014-09-13 ENCOUNTER — Encounter: Payer: Self-pay | Admitting: Surgery

## 2014-09-13 NOTE — Progress Notes (Signed)
HPI:  The patient returns today for follow up of an aortic root aneurysm. I saw him one year ago for initial consultation and a CTA of the chest showed dilatation of the aortic root with a sinus of valsalva diameter of 48 mm. 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 He had an echo on 08/10/2013 that showed mild dilatation of the aortic sinuses to 46 mm with normal tricuspid aortic valve function and normal LV. An echo in 2012 reportedly showed a sinus diameter of 46 mm. A CTA last year showed the aortic root to have a diameter of 48 mm at the sinus level. He has not had any significant chest pain or shortness of breath over the past year. He says he does have ocassional fleeting pain in the left chest that only last for a few seconds.   Current Outpatient Prescriptions  Medication Sig Dispense Refill  . amLODipine (NORVASC) 10 MG tablet Take 1 tablet (10 mg total) by mouth daily.  30 tablet  11  . lisinopril-hydrochlorothiazide (PRINZIDE,ZESTORETIC) 20-25 MG per tablet Take 1 tablet by mouth daily.  30 tablet  11  . metoprolol succinate (TOPROL XL) 100 MG 24 hr tablet Take 1/2 tablet (50mg ) Daily  30 tablet  11  . sildenafil (VIAGRA) 100 MG tablet Take 1 tablet (100 mg total) by mouth daily as needed for erectile dysfunction.  15 tablet  12  . Tetrahydrozoline HCl (VISINE OP) Place 2 drops into both eyes as needed (dry eyes).       No current facility-administered medications for this visit.     Physical Exam: BP 126/91  Pulse 66  Ht 6' (1.829 m)  Wt 244 lb (110.678 kg)  BMI 33.09 kg/m2  SpO2 98% He looks well Cardiac exam shows a regular rate and rhythm with normal heart sounds. There is no murmur. Lungs are clear  Diagnostic Tests:  CLINICAL DATA: Thoracic aortic ectasia.  EXAM:  CT ANGIOGRAPHY CHEST WITH CONTRAST  TECHNIQUE:  Multidetector CT imaging of the chest was performed using the  standard protocol during bolus administration of  intravenous  contrast. Multiplanar CT image reconstructions and MIPs were  obtained to evaluate the vascular anatomy.  CONTRAST: 65mL OMNIPAQUE IOHEXOL 350 MG/ML SOLN  COMPARISON: CT scan of July 20, 2013.  FINDINGS:  No pneumothorax or pleural effusion is noted. No acute pulmonary  disease is noted. Stable 3 mm nodule is noted in right middle lobe.  No mediastinal mass or adenopathy is noted. There remains prominent  left-sided hemi azygous vein. Left innominate vein is widely patent.  There is no evidence of thoracic aortic dissection. Visualized  portion of upper abdomen appears normal.  Stable sinus of Valsalva aneurysm is noted measuring 4.8 cm. No  significant osseous abnormality is noted. Aberrant right subclavian  artery is again noted.  Review of the MIP images confirms the above findings.  IMPRESSION:  Stable sinus of Valsalva aneurysm is noted measuring 4.8 cm.  Aberrant right subclavian artery is also noted. No significant  change compared to prior exam.  Stable 3 mm nodule is noted in the right middle lobe. This can be  considered benign at this point and no further follow-up is  required.  Electronically Signed  By: Roque LiasJames Green M.D.  On: 09/11/2014 14:33   Impression:  He has a stable aortic root aneurysm that is 4.8 cm in diameter with normal aortic valve function by echo last year and no murmur on exam.  I think it would be best to continue following this closely unless we see significant enlargement on subsequent CT scan. His BP is under good control and he is on a beta blocker.  Plan:  I will see him in 1 year with a CTA of the chest.

## 2014-10-17 ENCOUNTER — Encounter: Payer: Self-pay | Admitting: *Deleted

## 2014-10-21 ENCOUNTER — Encounter: Payer: Self-pay | Admitting: Interventional Cardiology

## 2014-10-21 ENCOUNTER — Ambulatory Visit (INDEPENDENT_AMBULATORY_CARE_PROVIDER_SITE_OTHER): Payer: BC Managed Care – PPO | Admitting: Interventional Cardiology

## 2014-10-21 VITALS — BP 118/84 | HR 80 | Ht 72.0 in | Wt 248.0 lb

## 2014-10-21 DIAGNOSIS — I10 Essential (primary) hypertension: Secondary | ICD-10-CM

## 2014-10-21 DIAGNOSIS — Q2543 Congenital aneurysm of aorta: Secondary | ICD-10-CM

## 2014-10-21 DIAGNOSIS — Q254 Other congenital malformations of aorta: Secondary | ICD-10-CM

## 2014-10-21 NOTE — Progress Notes (Signed)
Patient ID: Bryan Riggs, male   DOB: 05/07/1966, 48 y.o.   MRN: [id number]    [address] [place], [place]  [address] Phone: [phone] Fax:  [phone]  Date:  10/21/2014   ID:  Bryan SignsHarold J Riggs, DOB 05/07/1966, MRN [id number]  PCP:  Lupe CarneyMITCHELL,DEAN, MD   ASSESSMENT:  1. The blood pressure is controlled as noted below. Essential hypertension, controlled 2. Aortic aneurysm stable and recent seen by Dr. Laneta SimmersBartle 3.  PLAN:  1. Continue the current medical regimen 2. Clinical follow-up in 9 months   SUBJECTIVE: Bryan Riggs is a 48 y.o. male who is doing well with recent medication change. No side effects on high-dose beta blocker. Evaluation with Dr. Laneta SimmersBartle was a good report with no need for surgical intervention at this time.   Wt Readings from Last 3 Encounters:  10/21/14 248 lb (112.492 kg)  09/11/14 244 lb (110.678 kg)  07/24/14 259 lb (117.482 kg)     Past Medical History  Diagnosis Date  . Erectile dysfunction     DR. NESI  . Hypertension   . Kidney stone   . Chest pain CT ANGIO CHEST 07/20/13    LED TO DISCOVERY OF SINUS OF VALSALVA  ANEURYSM  . 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 Aberrant subclavian artery     RIGHT...PER CT CHEST...07/20/13    Current Outpatient Prescriptions  Medication Sig Dispense Refill  . amLODipine (NORVASC) 10 MG tablet Take 1 tablet (10 mg total) by mouth daily. 30 tablet 11  . lisinopril-hydrochlorothiazide (PRINZIDE,ZESTORETIC) 20-25 MG per tablet Take 1 tablet by mouth daily. 30 tablet 11  . metoprolol succinate (TOPROL XL) 100 MG 24 hr tablet Take 1/2 tablet (50mg ) Daily 30 tablet 11  . sildenafil (VIAGRA) 100 MG tablet Take 1 tablet (100 mg total) by mouth daily as needed for erectile dysfunction. 15 tablet 12  . Tetrahydrozoline HCl (VISINE OP) Place 2 drops into both eyes as needed (dry eyes).     No current facility-administered medications for  this visit.    Allergies:    Allergies  Allergen Reactions  . Sulfa Antibiotics Rash and Nausea And Vomiting    Break out, itching  . Viagra [Sildenafil Citrate] Other (See Comments)    FLUSHING     Social History:  The patient  reports that he has never smoked. He does not have any smokeless tobacco history on file. He reports that he drinks alcohol.   ROS:  Please see the history of present illness.   Trying to exercise. Weight is been stable.   All other systems reviewed and negative.   OBJECTIVE: VS:  BP 118/84 mmHg  Pulse 80  Ht 6' (1.829 m)  Wt 248 lb (112.492 kg)  BMI 33.63 kg/m2 Well nourished, well developed, in no acute distress, younger appearing HEENT: normal Neck: JVD flat. Carotid bruit absent  Cardiac:  normal S1, S2; RRR; no murmur Lungs:  clear to auscultation bilaterally, no wheezing, rhonchi or rales Abd: soft, nontender, no hepatomegaly Ext: Edema absent. Pulses 2+ Skin: warm and dry Neuro:  CNs 2-12 intact, no focal abnormalities noted  EKG:  Not performed       Signed, Darci NeedleHenry W. B. Sabas Frett III, MD 10/21/2014 3:56 PM

## 2014-10-21 NOTE — Patient Instructions (Signed)
Your physician recommends that you continue on your current medications as directed. Please refer to the Current Medication list given to you today.  Your physician wants you to follow-up in: 9 months You will receive a reminder letter in the mail two months in advance. If you don't receive a letter, please call our office to schedule the follow-up appointment.  

## 2015-02-12 ENCOUNTER — Telehealth: Payer: Self-pay

## 2015-02-12 NOTE — Telephone Encounter (Signed)
Okay to refill Viagra at the same dose as before.

## 2015-02-13 ENCOUNTER — Other Ambulatory Visit: Payer: Self-pay

## 2015-02-13 DIAGNOSIS — N529 Male erectile dysfunction, unspecified: Secondary | ICD-10-CM

## 2015-02-13 MED ORDER — SILDENAFIL CITRATE 100 MG PO TABS: 100.0000 mg | ORAL_TABLET | Freq: Every day | ORAL | Status: DC | PRN

## 2015-08-15 ENCOUNTER — Telehealth: Payer: Self-pay

## 2015-08-15 NOTE — Telephone Encounter (Signed)
Cardiac clearance place in MR nurse fax box to be faxed to [facility]

## 2015-08-25 ENCOUNTER — Other Ambulatory Visit: Payer: Self-pay | Admitting: *Deleted

## 2015-08-25 DIAGNOSIS — Q2543 Congenital aneurysm of aorta: Secondary | ICD-10-CM

## 2015-09-03 ENCOUNTER — Ambulatory Visit: Payer: Self-pay | Admitting: Surgery

## 2015-09-03 ENCOUNTER — Inpatient Hospital Stay: Admission: RE | Admit: 2015-09-03 | Payer: Self-pay | Source: Ambulatory Visit

## 2015-09-17 ENCOUNTER — Encounter: Payer: Self-pay | Admitting: Surgery

## 2015-09-17 ENCOUNTER — Ambulatory Visit
Admission: RE | Admit: 2015-09-17 | Discharge: 2015-09-17 | Disposition: A | Discharge status: Home / Self Care | Payer: BLUE CROSS/BLUE SHIELD | Source: Ambulatory Visit | Attending: Surgery | Admitting: Surgery

## 2015-09-17 ENCOUNTER — Ambulatory Visit (INDEPENDENT_AMBULATORY_CARE_PROVIDER_SITE_OTHER): Payer: BLUE CROSS/BLUE SHIELD | Admitting: Surgery

## 2015-09-17 VITALS — BP 145/96 | HR 76 | Resp 20 | Ht 72.0 in | Wt 248.0 lb

## 2015-09-17 DIAGNOSIS — Q2543 Congenital aneurysm of aorta: Secondary | ICD-10-CM

## 2015-09-17 LAB — CREATININE, SERUM: Creat: 1.24 mg/dL (ref 0.70–1.33)

## 2015-09-17 MED ORDER — IOPAMIDOL (ISOVUE-370) INJECTION 76%
75.0000 mL | Freq: Once | INTRAVENOUS | Status: AC | PRN
  Administered 2015-09-17: 75 mL via INTRAVENOUS

## 2015-09-17 NOTE — Progress Notes (Signed)
HPI:  The patient returns today for follow up of his aortic root  aneurysm. When I saw him one year ago it was 48 mm at the sinus of valsalva. This was unchanged from the prior year. 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 He had an echo on 08/10/2013 that showed mild dilatation of the aortic sinuses to 46 mm with normal tricuspid aortic valve function and normal LV. An echo in 2012 reportedly showed a sinus diameter of 46 mm. He has not had any significant chest pain or shortness of breath over the past year. He says he does have ocassional fleeting pain in the left chest that only last for a few seconds and this has been present for several years.  Current Outpatient Prescriptions  Medication Sig Dispense Refill  . amLODipine (NORVASC) 10 MG tablet Take 1 tablet (10 mg total) by mouth daily. 30 tablet 11  . lisinopril-hydrochlorothiazide (PRINZIDE,ZESTORETIC) 20-25 MG per tablet Take 1 tablet by mouth daily. 30 tablet 11  . metoprolol succinate (TOPROL XL) 100 MG 24 hr tablet Take 1/2 tablet ( ) Daily 30 tablet 11  . sildenafil (VIAGRA) 100 MG tablet Take 1 tablet (100 mg total) by mouth daily as needed for erectile dysfunction. 15 tablet 12   No current facility-administered medications for this visit.     Physical Exam: BP 145/96 mmHg  Pulse 76  Resp 20  Ht 6' (1.829 m)  Wt 248 lb (112.492 kg)  BMI 33.63 kg/m2  SpO2 98% He looks well Cardiac exam shows a regular rate and rhythm with normal heart sounds. There is no murmur. Lungs are clear   Diagnostic Tests:  CLINICAL DATA: Follow up thoracic aortic aneurysm.  EXAM: CT ANGIOGRAPHY CHEST WITH CONTRAST  TECHNIQUE: Multidetector CT imaging of the chest was performed using the standard protocol during bolus administration of intravenous contrast. Multiplanar CT image reconstructions and MIPs were obtained to evaluate the vascular anatomy.  CONTRAST: 75 cc Isovue 370  IV.  COMPARISON: 09/11/2014 chest CT angiogram.  FINDINGS: Mediastinum/Nodes: Normal heart size. No pericardial fluid/thickening. There is stable aneurysmal dilatation of the thoracic aorta at the level of the sinus of Valsalva, which measures 4.8 cm in diameter, unchanged. The ascending aorta measures 3.8 cm maximum diameter, normal. The aortic arch measures 3.6 cm in maximum diameter, normal. The descending thoracic aorta measures 2.8 cm maximum diameter, normal. The aorta measures 2.4 cm at the diaphragmatic hiatus, normal. Re- demonstrated is an aberrant right subclavian artery arising from the distal aortic arch and taking a retroesophageal coarse, with no proximal aneurysmal dilatation. 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 Normal caliber pulmonary arteries. No central pulmonary emboli. Normal visualized thyroid. Normal esophagus. No pathologically enlarged axillary, mediastinal or hilar lymph nodes.  Lungs/Pleura: No pneumothorax. No pleural effusion. Right middle lobe 3 mm solid pulmonary nodule (series 5/ image 35) is stable since 07/20/2013 and benign. Left lower lobe 3 mm solid pulmonary nodule (5/29) is stable since 07/20/2013 and benign. No acute consolidative airspace disease, new significant pulmonary nodules or lung masses.  Upper abdomen: Unremarkable.  Musculoskeletal: No aggressive appearing focal osseous lesions.  Review of the MIP images confirms the above findings.  IMPRESSION: 1. Stable 4.8 cm aortic aneurysm at the sinus of Valsalva. 2. Aberrant nonaneurysmal right subclavian artery. 3. No active pulmonary disease.   Electronically Signed  By: Tomasa Hose  Poff M.D.  On: 09/17/2015 13:20        Impression:  He has a stable 4.8 cm aortic root aneurysm that has been stable since I saw him initially in 08/2013 and minimally  enlarged compared to a measurement by echo in 2012 when it was 46 mm. His BP is generally well-controlled although a little elevated today. I have recommended continued follow up yearly.  Plan:  I will see him back in one year with a CTA of the chest.   Alleen BorneBryan K Bartle, MD [facility] [phone]

## 2015-10-24 ENCOUNTER — Other Ambulatory Visit: Payer: Self-pay | Admitting: *Deleted

## 2015-10-24 DIAGNOSIS — Q278 Other specified congenital malformations of peripheral vascular system: Secondary | ICD-10-CM

## 2015-10-24 DIAGNOSIS — I10 Essential (primary) hypertension: Secondary | ICD-10-CM

## 2015-10-24 DIAGNOSIS — Q2543 Congenital aneurysm of aorta: Secondary | ICD-10-CM

## 2015-10-24 MED ORDER — METOPROLOL SUCCINATE ER 100 MG PO TB24: ORAL_TABLET | ORAL | Status: DC

## 2015-11-21 ENCOUNTER — Telehealth: Payer: Self-pay | Admitting: Physician Assistant

## 2015-11-21 ENCOUNTER — Telehealth: Payer: Self-pay | Admitting: Cardiology

## 2015-11-21 ENCOUNTER — Other Ambulatory Visit: Payer: Self-pay | Admitting: Interventional Cardiology

## 2015-11-21 DIAGNOSIS — I10 Essential (primary) hypertension: Secondary | ICD-10-CM

## 2015-11-21 DIAGNOSIS — Q2543 Congenital aneurysm of aorta: Secondary | ICD-10-CM

## 2015-11-21 DIAGNOSIS — Q278 Other specified congenital malformations of peripheral vascular system: Secondary | ICD-10-CM

## 2015-11-21 MED ORDER — METOPROLOL SUCCINATE ER 100 MG PO TB24: ORAL_TABLET | ORAL | Status: DC

## 2015-11-21 NOTE — Telephone Encounter (Addendum)
Pt called after hours answering service requesting refill on metoprolol - says he is out and it can't wait. Has not been seen since 10/2014. This was refilled for 15 tablets in 10/2015. He confirmed he is taking this same dose. I advised that I can refill for 30 tabs but he needs to call on Monday when office re-opens to schedule a f/u appointment. He verbalized understanding he needs to be seen in order for us to keep refilling med. Will forward to schedulers so they can call him Monday PM if he has not called in.  Dayna Dunn PA-C

## 2015-11-21 NOTE — Telephone Encounter (Signed)
Duplicate note opened in error  

## 2015-11-25 ENCOUNTER — Ambulatory Visit: Payer: BLUE CROSS/BLUE SHIELD | Admitting: Interventional Cardiology

## 2015-12-01 ENCOUNTER — Encounter: Payer: Self-pay | Admitting: Cardiology

## 2015-12-01 ENCOUNTER — Ambulatory Visit (INDEPENDENT_AMBULATORY_CARE_PROVIDER_SITE_OTHER): Payer: BLUE CROSS/BLUE SHIELD | Admitting: Cardiology

## 2015-12-01 VITALS — BP 118/72 | HR 82 | Ht 72.0 in | Wt 250.8 lb

## 2015-12-01 DIAGNOSIS — Q278 Other specified congenital malformations of peripheral vascular system: Secondary | ICD-10-CM

## 2015-12-01 DIAGNOSIS — I10 Essential (primary) hypertension: Secondary | ICD-10-CM

## 2015-12-01 DIAGNOSIS — Q2543 Congenital aneurysm of aorta: Secondary | ICD-10-CM

## 2015-12-01 LAB — BASIC METABOLIC PANEL
BUN: 21 mg/dL (ref 7–25)
CALCIUM: 9.6 mg/dL (ref 8.6–10.3)
CO2: 27 mmol/L (ref 20–31)
Chloride: 102 mmol/L (ref 98–110)
Creat: 1.55 mg/dL — ABNORMAL HIGH (ref 0.70–1.33)
Glucose, Bld: 87 mg/dL (ref 65–99)
Potassium: 3.5 mmol/L (ref 3.5–5.3)
SODIUM: 140 mmol/L (ref 135–146)

## 2015-12-01 MED ORDER — METOPROLOL SUCCINATE ER 100 MG PO TB24: ORAL_TABLET | ORAL | Status: DC

## 2015-12-01 MED ORDER — AMLODIPINE BESYLATE 10 MG PO TABS
10.0000 mg | ORAL_TABLET | Freq: Every day | ORAL | Status: DC
Start: 2015-12-01 — End: 2017-06-23

## 2015-12-01 MED ORDER — LISINOPRIL-HYDROCHLOROTHIAZIDE 20-25 MG PO TABS
1.0000 | ORAL_TABLET | Freq: Every day | ORAL | Status: DC
Start: 2015-12-01 — End: 2016-12-15

## 2015-12-01 NOTE — Progress Notes (Signed)
12/01/2015 Bryan Riggs   13-Oct-1966  [id number]  Primary Physician Lupe Carneyean Mitchell, MD Primary Cardiologist: Dr. Katrinka BlazingSmith   Reason for Visit/CC: F/u for HTN  HPI:  The patient is a 50 year old male, followed by Dr. Katrinka BlazingSmith, who presents to clinic today for routine follow-up and for medication refills. He is also followed by Dr. Laneta SimmersBartle. He has a known aortic root aneurysm. He was recently seen by Dr. Laneta SimmersBartle in November 2016.  Chest CT showed a stable 4.8 cm aortic aneurysm at the sinus of Valsalva. He was instructed to follow-up in one year with Dr. Laneta SimmersBartle with a repeat CTA of the chest. Dr. Katrinka BlazingSmith has been managing his hypertension. He also has a history of chronic renal insufficiency with baseline serum creatinine around 1.5. He was last seen by Dr. Katrinka BlazingSmith in  Dec 2015 and was noted to be stable from a cardiac standpoint. His blood pressure was well controlled on amlodipine, lisinopril/hydrochlorothiazide and Metoprolol. He was instructed follow-up in one year for repeat assessment.  He reports that he has done well since his last OV. He did tear his left knee meniscus and recently underwent surgery last Friday and is ambulating with crutches however he denies any cardiac issues. He denies any chest discomfort, shortness of breath, lightheadedness, dizziness, syncope/near-syncope, lower extremity edema, orthopnea, PND or claudication. He also denies any exertional symptoms. He reports full medication compliance. He denies tobacco use. He is tolerating his meds ok. 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 He uses Viagra for this.    EKG in clinic today demonstrates sinus rhythm with first-degree AV block. Heart rate 68 bpm. This is an otherwise normal EKG. Blood pressure is well controlled 118/72.   Current Outpatient Prescriptions  Medication Sig Dispense Refill  . amLODipine (NORVASC) 10 MG tablet Take 1 tablet (10 mg total) by mouth daily. 30 tablet 11  . lisinopril-hydrochlorothiazide  (PRINZIDE,ZESTORETIC) 20-25 MG per tablet Take 1 tablet by mouth daily. 30 tablet 11  . metoprolol succinate (TOPROL-XL) 100 MG 24 hr tablet Take 1/2 tablet (50mg ) by mouth daily 30 tablet 0  . sildenafil (VIAGRA) 100 MG tablet Take 1 tablet (100 mg total) by mouth daily as needed for erectile dysfunction. 15 tablet 12   No current facility-administered medications for this visit.    Allergies  Allergen Reactions  . Sulfa Antibiotics Rash and Nausea And Vomiting    Break out, itching  . Viagra [Sildenafil Citrate] Other (See Comments)    FLUSHING     Social History   Social History  . Marital Status: Married    Spouse Name: N/A  . Number of Children: N/A  . Years of Education: N/A   Occupational History  . Not on file.   Social History Main Topics  . Smoking status: Never Smoker   . Smokeless tobacco: Not on file  . Alcohol Use: Yes     Comment: RARE  . Drug Use: Not on file  . Sexual Activity: Not on file   Other Topics Concern  . Not on file   Social History Narrative     Review of Systems: General: negative for chills, fever, night sweats or weight changes.  Cardiovascular: negative for chest pain, dyspnea on exertion, edema, orthopnea, palpitations, paroxysmal nocturnal dyspnea or shortness of breath Dermatological: negative for rash Respiratory: negative for cough or wheezing Urologic: negative for hematuria Abdominal: negative for nausea, vomiting, diarrhea, bright red blood per rectum, melena, or hematemesis Neurologic: negative for visual changes, syncope, or dizziness  All other systems reviewed and are otherwise negative except as noted above.    Height 6' (1.829 m), weight 250 lb 12.8 oz (113.762 kg).  General appearance: alert, cooperative and no distress Neck: no carotid bruit and no JVD Lungs: clear to auscultation bilaterally Heart: regular rate and rhythm, S1, S2 normal, no murmur, click, rub or gallop Extremities: no LEE Pulses: 2+ and  symmetric Skin: warm and dry Neurologic: Grossly normal  EKG sinus rhythm with first-degree AV block. Heart rate 68 bpm. This is an otherwise normal EKG.  ASSESSMENT AND PLAN:   1. HTN: BP is well controlled on amlodipine, metoprolol and lisinopril/ HCT. We will refill meds and will check a BMP given h/o renal insufficiency and ACE/diuretic use.   2. Aortic Root Aneurysm: Followed by Dr. Laneta Simmers. His BP is well controlled. No chest pain. Recent chest CT 09/2015 showed a stable 4.8 cm aneurysm. No indication for surgery at this time. Dr.  Laneta Simmers has recommended repeat assessment in one year.  3. ED: likely secondary to BB use + age.  He uses Viagra.   PLAN  F/u with Dr. Katrinka Blazing in 6 months.   Robbie Lis PA-C 12/01/2015 2:11 PM

## 2015-12-01 NOTE — Patient Instructions (Signed)
Medication Instructions:  Your physician recommends that you continue on your current medications as directed. Please refer to the Current Medication list given to you today. Your medications have been refilled today  Labwork: bmet today  Testing/Procedures: None ordered  Follow-Up: Your physician wants you to follow-up in: 6 months with Dr.Smith You will receive a reminder letter in the mail two months in advance. If you don't receive a letter, please call our office to schedule the follow-up appointment.   Any Other Special Instructions Will Be Listed Below (If Applicable).     If you need a refill on your cardiac medications before your next appointment, please call your pharmacy.

## 2016-02-28 DIAGNOSIS — N342 Other urethritis: Secondary | ICD-10-CM | POA: Diagnosis not present

## 2016-02-28 DIAGNOSIS — R300 Dysuria: Secondary | ICD-10-CM | POA: Diagnosis not present

## 2016-03-02 DIAGNOSIS — M25362 Other instability, left knee: Secondary | ICD-10-CM | POA: Diagnosis not present

## 2016-03-19 ENCOUNTER — Other Ambulatory Visit: Payer: Self-pay | Admitting: Interventional Cardiology

## 2016-04-13 DIAGNOSIS — M25362 Other instability, left knee: Secondary | ICD-10-CM | POA: Diagnosis not present

## 2016-05-25 DIAGNOSIS — M25362 Other instability, left knee: Secondary | ICD-10-CM | POA: Diagnosis not present

## 2016-06-22 ENCOUNTER — Encounter: Payer: Self-pay | Admitting: Interventional Cardiology

## 2016-06-22 ENCOUNTER — Ambulatory Visit (INDEPENDENT_AMBULATORY_CARE_PROVIDER_SITE_OTHER): Payer: BLUE CROSS/BLUE SHIELD | Admitting: Interventional Cardiology

## 2016-06-22 VITALS — BP 132/98 | HR 65 | Ht 69.5 in | Wt 251.2 lb

## 2016-06-22 DIAGNOSIS — Q278 Other specified congenital malformations of peripheral vascular system: Secondary | ICD-10-CM

## 2016-06-22 DIAGNOSIS — Q2543 Congenital aneurysm of aorta: Secondary | ICD-10-CM

## 2016-06-22 DIAGNOSIS — I10 Essential (primary) hypertension: Secondary | ICD-10-CM

## 2016-06-22 NOTE — Progress Notes (Signed)
Cardiology Office Note    Date:  06/22/2016   ID:  Bryan ParkinsHarold J Riggs, DOB Apr 15, 1966, MRN [id number]  PCP:  Bryan Carneyean Mitchell, MD  Cardiologist: Bryan NoeHenry W Smith III, MD   Chief Complaint  Patient presents with  . Chest Pain    History of Present Illness:  Bryan SignsHarold J Riggs is a 50 y.o. male follow-up of essential hypertension, sinus of Valsalva aneurysm, recurring chest discomfort, and hyperlipidemia.  Bryan SharkHarold has gained weight. He recently injured his left knee. He has not been able to exercise his usual. He has fleeting chest discomfort similar to that described over the years. The discomfort is nonexertional. It is very short duration. It is migratory in the chest. He denies orthopnea, PND, and peripheral edema. Overall he feels well     Past Medical History:  Diagnosis Date  . Aberrant subclavian artery    RIGHT...PER CT CHEST...07/20/13  . 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 Chest pain CT ANGIO CHEST 07/20/13   LED TO DISCOVERY OF SINUS OF VALSALVA  ANEURYSM  . Erectile dysfunction    DR. NESI  . Hypertension   . Kidney stone     Past Surgical History:  Procedure Laterality Date  . NO PAST SURGERIES      Current Medications: Outpatient Medications Prior to Visit  Medication Sig Dispense Refill  . amLODipine (NORVASC) 10 MG tablet Take 1 tablet (10 mg total) by mouth daily. 30 tablet 11  . lisinopril-hydrochlorothiazide (PRINZIDE,ZESTORETIC) 20-25 MG tablet Take 1 tablet by mouth daily. 30 tablet 11  . metoprolol succinate (TOPROL-XL) 100 MG 24 hr tablet Take 1/2 tablet (50mg ) by mouth daily 15 tablet 11  . VIAGRA 100 MG tablet TAKE 1 TABLET(100 MG) BY MOUTH DAILY AS NEEDED FOR ERECTILE DYSFUNCTION 15 tablet 0   No facility-administered medications prior to visit.      Allergies:   Sulfa antibiotics and Viagra [sildenafil citrate]   Social History   Social History  . Marital status: Married    Spouse name:  N/A  . Number of children: N/A  . Years of education: N/A   Social History Main Topics  . Smoking status: Never Smoker  . Smokeless tobacco: Never Used  . Alcohol use Yes     Comment: RARE  . Drug use: Unknown  . Sexual activity: Not Asked   Other Topics Concern  . None   Social History Narrative  . None     Family History:  The patient's family history includes Cancer in his brother, father, and mother; Hypertension in his father.   ROS:   Please see the history of present illness.    Left knee discomfort and recent injury to the meniscus in that knee. Excessive sweating. Bilateral lower extremity swelling. Doesn't sleep well.  All other systems reviewed and are negative.   PHYSICAL EXAM:   VS:  BP (!) 132/98   Pulse 65   Ht 5' 9.5" (1.765 m)   Wt 251 lb 3.2 oz (113.9 kg)   BMI 36.56 kg/m    GEN: Well nourished, well developed, in no acute distress  HEENT: normal  Neck: no JVD, carotid bruits, or masses Cardiac: RRR; no murmurs, rubs, or gallops. Does have trace to 1+ bilateral ankle edema related to amlodipine therapy. Respiratory:  clear to auscultation bilaterally, normal work of breathing GI: soft, nontender, nondistended, + BS MS: no deformity or atrophy  Skin: warm and dry, no rash  Neuro:  Alert and Oriented x 3, Strength and sensation are intact Psych: euthymic mood, full affect  Wt Readings from Last 3 Encounters:  06/22/16 251 lb 3.2 oz (113.9 kg)  12/01/15 250 lb 12.8 oz (113.8 kg)  09/17/15 248 lb (112.5 kg)      Studies/Labs Reviewed:   EKG:  EKG  Normal sinus rhythm, left axis deviation, prominent voltage, and first-degree AV block. No significant change when compared to prior tracings.  Recent Labs: 12/01/2015: BUN 21; Creat 1.55; Potassium 3.5; Sodium 140   Lipid Panel No results found for: CHOL, TRIG, HDL, CHOLHDL, VLDL, LDLCALC, LDLDIRECT  Additional studies/ records that were reviewed today include:  CT angina of the chest  09/17/15: IMPRESSION: 1. Stable 4.8 cm aortic aneurysm at the sinus of Valsalva. 2. Aberrant nonaneurysmal right subclavian artery. 3. No active pulmonary disease.   ASSESSMENT:    1. 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 Essential hypertension   3. Aberrant subclavian artery      PLAN:  In order of problems listed above:  1. Continue tight blood pressure control to prevent progression. He is being followed closely by Dr. Laneta Simmers 2. Adequate blood pressure control. Repeat blood pressure today was 128/82 mmHg. He takes his medication at night. I am seeing him mid-to-late afternoon at the nadir of antihypertensive effect. Low salt diet is recommended. Weight loss is recommended. Aerobic and not isometric activity is recommended.    Medication Adjustments/Labs and Tests Ordered: Current medicines are reviewed at length with the patient today.  Concerns regarding medicines are outlined above.  Medication changes, Labs and Tests ordered today are listed in the Patient Instructions below. There are no Patient Instructions on file for this visit.   Signed, Bryan Noe, MD  06/22/2016 3:38 PM    [facility] [address] [place], [place], [place]  [address] Phone: [phone]; Fax: [phone]

## 2016-06-22 NOTE — Patient Instructions (Addendum)
Medication Instructions:  Your physician recommends that you continue on your current medications as directed. Please refer to the Current Medication list given to you today.   Labwork: None ordered  Testing/Procedures: None ordered  Follow-Up: Your physician wants you to follow-up in: 1 year with Dr.Smith You will receive a reminder letter in the mail two months in advance. If you don't receive a letter, please call our office to schedule the follow-up appointment.   Any Other Special Instructions Will Be Listed Below (If Applicable).     If you need a refill on your cardiac medications before your next appointment, please call your pharmacy.   Low-Sodium Eating Plan Sodium raises blood pressure and causes water to be held in the body. Getting less sodium from food will help lower your blood pressure, reduce any swelling, and protect your heart, liver, and kidneys. We get sodium by adding salt (sodium chloride) to food. Most of our sodium comes from canned, boxed, and frozen foods. Restaurant foods, fast foods, and pizza are also very high in sodium. Even if you take medicine to lower your blood pressure or to reduce fluid in your body, getting less sodium from your food is important. WHAT IS MY PLAN? Most people should limit their sodium intake to 2,300 mg a day. Your health care provider recommends that you limit your sodium intake to ___2 grams_______ a day.  WHAT DO I NEED TO KNOW ABOUT THIS EATING PLAN? For the low-sodium eating plan, you will follow these general guidelines:  Choose foods with a % Daily Value for sodium of less than 5% (as listed on the food label).   Use salt-free seasonings or herbs instead of table salt or sea salt.   Check with your health care provider or pharmacist before using salt substitutes.   Eat fresh foods.  Eat more vegetables and fruits.  Limit canned vegetables. If you do use them, rinse them well to decrease the sodium.   Limit  cheese to 1 oz (28 g) per day.   Eat lower-sodium products, often labeled as "lower sodium" or "no salt added."  Avoid foods that contain monosodium glutamate (MSG). MSG is sometimes added to [place] food and some canned foods.  Check food labels (Nutrition Facts labels) on foods to learn how much sodium is in one serving.  Eat more home-cooked food and less restaurant, buffet, and fast food.  When eating at a restaurant, ask that your food be prepared with less salt, or no salt if possible.  HOW DO I READ FOOD LABELS FOR SODIUM INFORMATION? The Nutrition Facts label lists the amount of sodium in one serving of the food. If you eat more than one serving, you must multiply the listed amount of sodium by the number of servings. Food labels may also identify foods as:  Sodium free--Less than 5 mg in a serving.  Very low sodium--35 mg or less in a serving.  Low sodium--140 mg or less in a serving.  Light in sodium--50% less sodium in a serving. For example, if a food that usually has 300 mg of sodium is changed to become light in sodium, it will have 150 mg of sodium.  Reduced sodium--25% less sodium in a serving. For example, if a food that usually has 400 mg of sodium is changed to reduced sodium, it will have 300 mg of sodium. WHAT FOODS CAN I EAT? Grains Low-sodium cereals, including oats, puffed wheat and rice, and shredded wheat cereals. Low-sodium crackers. Unsalted rice and  pasta. Lower-sodium bread.  Vegetables Frozen or fresh vegetables. Low-sodium or reduced-sodium canned vegetables. Low-sodium or reduced-sodium tomato sauce and paste. Low-sodium or reduced-sodium tomato and vegetable juices.  Fruits Fresh, frozen, and canned fruit. Fruit juice.  Meat and Other Protein Products Low-sodium canned tuna and salmon. Fresh or frozen meat, poultry, seafood, and fish. Lamb. Unsalted nuts. Dried beans, peas, and lentils without added salt. Unsalted canned beans. Homemade  soups without salt. Eggs.  Dairy Milk. Soy milk. Ricotta cheese. Low-sodium or reduced-sodium cheeses. Yogurt.  Condiments Fresh and dried herbs and spices. Salt-free seasonings. Onion and garlic powders. Low-sodium varieties of mustard and ketchup. Fresh or refrigerated horseradish. Lemon juice.  Fats and Oils Reduced-sodium salad dressings. Unsalted butter.  Other Unsalted popcorn and pretzels.  The items listed above may not be a complete list of recommended foods or beverages. Contact your dietitian for more options. WHAT FOODS ARE NOT RECOMMENDED? Grains Instant hot cereals. Bread stuffing, pancake, and biscuit mixes. Croutons. Seasoned rice or pasta mixes. Noodle soup cups. Boxed or frozen macaroni and cheese. Self-rising flour. Regular salted crackers. Vegetables Regular canned vegetables. Regular canned tomato sauce and paste. Regular tomato and vegetable juices. Frozen vegetables in sauces. Salted [place] fries. Olives. Rosita Fire. Relishes. Sauerkraut. Salsa. Meat and Other Protein Products Salted, canned, smoked, spiced, or pickled meats, seafood, or fish. Bacon, ham, sausage, hot dogs, corned beef, chipped beef, and packaged luncheon meats. Salt pork. Jerky. Pickled herring. Anchovies, regular canned tuna, and sardines. Salted nuts. Dairy Processed cheese and cheese spreads. Cheese curds. Blue cheese and cottage cheese. Buttermilk.  Condiments Onion and garlic salt, seasoned salt, table salt, and sea salt. Canned and packaged gravies. Worcestershire sauce. Tartar sauce. Barbecue sauce. Teriyaki sauce. Soy sauce, including reduced sodium. Steak sauce. Fish sauce. Oyster sauce. Cocktail sauce. Horseradish that you find on the shelf. Regular ketchup and mustard. Meat flavorings and tenderizers. Bouillon cubes. Hot sauce. Tabasco sauce. Marinades. Taco seasonings. Relishes. Fats and Oils Regular salad dressings. Salted butter. Margarine. Ghee. Bacon fat.  Other Potato and  tortilla chips. Corn chips and puffs. Salted popcorn and pretzels. Canned or dried soups. Pizza. Frozen entrees and pot pies.  The items listed above may not be a complete list of foods and beverages to avoid. Contact your dietitian for more information.   This information is not intended to replace advice given to you by your health care provider. Make sure you discuss any questions you have with your health care provider.   Document Released: 04/23/2002 Document Revised: 11/22/2014 Document Reviewed: 09/05/2013 Elsevier Interactive Patient Education [employer]

## 2016-06-30 ENCOUNTER — Telehealth: Payer: Self-pay | Admitting: Interventional Cardiology

## 2016-06-30 NOTE — Telephone Encounter (Signed)
called to inform pt that he has refills for all cardiac medication on file at [facility], pt aware & expressed understanding.

## 2016-06-30 NOTE — Telephone Encounter (Signed)
Found out that pt wanted Viagra, sent message to Dr. Michaelle CopasSmith's CMA Misty StanleyLisa for approval.

## 2016-06-30 NOTE — Telephone Encounter (Signed)
New message   *STAT* If patient is at the pharmacy, call can be transferred to refill team.   1. Which medications need to be refilled? (please list name of each medication and dose if known) pt did not want to disclose all meds   2. Which pharmacy/location (including street and city if local pharmacy) is medication to be sent to[place] [facility] [place]  3. Do they need a 30 day or 90 day supply? Per pt does not know the supply needed   Pt states meds expired and he needs refills on meds. Please call back to discuss if needed

## 2016-06-30 NOTE — Telephone Encounter (Signed)
Routed to Dr.Smith for approval 

## 2016-07-03 NOTE — Telephone Encounter (Signed)
Okay to refill Viagra,

## 2016-07-05 MED ORDER — SILDENAFIL CITRATE 100 MG PO TABS: ORAL_TABLET | ORAL | 0 refills | Status: DC

## 2016-07-05 NOTE — Telephone Encounter (Signed)
Rx for Viagra sent to pt pharmacy

## 2016-07-19 ENCOUNTER — Other Ambulatory Visit: Payer: Self-pay | Admitting: Interventional Cardiology

## 2016-07-19 DIAGNOSIS — N529 Male erectile dysfunction, unspecified: Secondary | ICD-10-CM

## 2016-07-20 NOTE — Telephone Encounter (Signed)
Ok to refill? Please advise. Thanks, MI 

## 2016-07-21 NOTE — Telephone Encounter (Signed)
Routed to Dr.Smith for approval 

## 2016-08-13 ENCOUNTER — Encounter: Payer: Self-pay | Admitting: Interventional Cardiology

## 2016-08-13 DIAGNOSIS — I10 Essential (primary) hypertension: Secondary | ICD-10-CM | POA: Diagnosis not present

## 2016-08-13 DIAGNOSIS — Z23 Encounter for immunization: Secondary | ICD-10-CM | POA: Diagnosis not present

## 2016-08-13 DIAGNOSIS — I719 Aortic aneurysm of unspecified site, without rupture: Secondary | ICD-10-CM | POA: Diagnosis not present

## 2016-08-13 DIAGNOSIS — N529 Male erectile dysfunction, unspecified: Secondary | ICD-10-CM | POA: Diagnosis not present

## 2016-09-08 ENCOUNTER — Other Ambulatory Visit: Payer: Self-pay | Admitting: Surgery

## 2016-09-08 DIAGNOSIS — I712 Thoracic aortic aneurysm, without rupture, unspecified: Secondary | ICD-10-CM

## 2016-09-27 ENCOUNTER — Other Ambulatory Visit: Payer: Self-pay | Admitting: Surgery

## 2016-09-29 ENCOUNTER — Ambulatory Visit
Admission: RE | Admit: 2016-09-29 | Discharge: 2016-09-29 | Disposition: A | Discharge status: Home / Self Care | Payer: BLUE CROSS/BLUE SHIELD | Source: Ambulatory Visit | Attending: Surgery | Admitting: Surgery

## 2016-09-29 ENCOUNTER — Ambulatory Visit (INDEPENDENT_AMBULATORY_CARE_PROVIDER_SITE_OTHER): Payer: BLUE CROSS/BLUE SHIELD | Admitting: Surgery

## 2016-09-29 ENCOUNTER — Encounter: Payer: Self-pay | Admitting: Surgery

## 2016-09-29 VITALS — BP 137/94 | HR 64 | Resp 18 | Ht 72.0 in | Wt 250.0 lb

## 2016-09-29 DIAGNOSIS — I712 Thoracic aortic aneurysm, without rupture, unspecified: Secondary | ICD-10-CM

## 2016-09-29 DIAGNOSIS — Q2543 Congenital aneurysm of aorta: Secondary | ICD-10-CM | POA: Diagnosis not present

## 2016-09-29 MED ORDER — IOPAMIDOL (ISOVUE-370) INJECTION 76%
75.0000 mL | Freq: Once | INTRAVENOUS | Status: AC | PRN
  Administered 2016-09-29: 75 mL via INTRAVENOUS

## 2016-09-30 ENCOUNTER — Encounter: Payer: Self-pay | Admitting: Surgery

## 2016-09-30 NOTE — Progress Notes (Signed)
HPI:  The patient returns today for follow up of his aortic root  aneurysm. When I saw him one year ago it was 48 mm at the sinus of valsalva and unchanged from the prior year. 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 He had an echo on 08/10/2013 that showed mild dilatation of the aortic sinuses to 46 mm with normal tricuspid aortic valve function and normal LV. An echo in 2012 reportedly showed a sinus diameter of 46 mm. He has not had any significant chest pain or shortness of breath over the past year. He still has ocassional fleeting pain in the left chest that only last for a few seconds and this has been present for several years.   Current Outpatient Prescriptions  Medication Sig Dispense Refill  . amLODipine (NORVASC) 10 MG tablet Take 1 tablet (10 mg total) by mouth daily. 30 tablet 11  . lisinopril-hydrochlorothiazide (PRINZIDE,ZESTORETIC) 20-25 MG tablet Take 1 tablet by mouth daily. 30 tablet 11  . metoprolol succinate (TOPROL-XL) 100 MG 24 hr tablet Take 1/2 tablet (50mg ) by mouth daily 15 tablet 11  . sildenafil (VIAGRA) 100 MG tablet TAKE 1 TABLET(100 MG) BY MOUTH DAILY AS NEEDED FOR ERECTILE DYSFUNCTION 15 tablet 0  . VIAGRA 100 MG tablet TAKE 1 TABLET(100 MG) BY MOUTH DAILY AS NEEDED FOR ERECTILE DYSFUNCTION 15 tablet 0   No current facility-administered medications for this visit.      Physical Exam: BP (!) 137/94   Pulse 64   Resp 18   Ht 6' (1.829 m)   Wt 250 lb (113.4 kg)   SpO2 97% Comment: ON ra  BMI 33.91 kg/m  He looks well Cardiac exam shows a regular rate and rhythm with normal heart sounds. There is no murmur. Lungs are clear  Diagnostic Tests:  CLINICAL DATA:  Follow-up of thoracic aortic aneurysm.  EXAM: CT ANGIOGRAPHY CHEST WITH CONTRAST  TECHNIQUE: Multidetector CT imaging of the chest was performed using the standard protocol during bolus administration of intravenous contrast. Multiplanar CT image  reconstructions and MIPs were obtained to evaluate the vascular anatomy.  CONTRAST:  75 mL Isovue 370  COMPARISON:  09/17/2015 and 09/11/2014  FINDINGS: Cardiovascular: Aortic root at the sinuses Valsalva measures 4.7 cm and stable. Ascending thoracic aorta measures 3.9 cm and stable. Aortic arch measures 3.6 cm and stable. Proximal descending thoracic aorta measures roughly 3 cm and stable. Aorta at the hiatus measures roughly 2.6 cm and stable. There is an aberrant right subclavian artery without aneurysm. Main pulmonary arteries are patent. There appears to be stenosis near the junction of the left innominate vein and left subclavian vein. Very little contrast is draining from the left arm into the left innominate vein. Multiple collateral vessels in the left upper chest and there is a large amount of contrast draining through the left superior intercostal vein that extends into the hemiazygous venous system and drains into the left renal vein.  Mediastinum/Nodes: Esophagus is unremarkable. The esophagus is just anterior to the right subclavian artery. There is no significant chest lymphadenopathy. Again noted is tissue in the anterior mediastinum which may represent some thymic tissue and similar to the previous examination. No significant pericardial effusion.  Lungs/Pleura: Trachea and mainstem bronchi are patent. There is a stable punctate pleural-based nodular density in the right upper lobe on sequence 5, image 32 which is appears stable since 09/03/2014. Stable tiny nodular densities in the right middle lobe on image 61 and 58.  Patchy ground-glass densities in the lower lobes may represent atelectasis. No significant pleural effusions. No large areas of consolidation. Stable 3 mm nodule in the superior segment of the left lower lobe on image 54. There is some volume loss in the left upper lobe.  Upper Abdomen: No acute abnormality in the upper  abdomen.  Musculoskeletal: No acute bone abnormality.  Review of the MIP images confirms the above findings.  IMPRESSION: Stable aneurysm at the sinus of Valsalva measuring up to 4.7 cm.  Stenosis involving the left innominate/subclavian vein. This narrowing may be exacerbated by left arm positioning during the scan.  Aberrant right subclavian vein.  Patchy ground-glass densities in the lungs probably related to areas of volume loss.  Stable small pulmonary nodules.   Electronically Signed   By: Richarda OverlieAdam  Henn M.D.   On: 09/29/2016 12:51   Impression:  He has a stable aortic root aneurysm that measures 4.7 cm today. This has been stable since I saw him initially in 08/2013 and minimally enlarged compared to a measurement by echo in 2012 when it was 46 mm. This is still well below the threshold for surgical treatment of 5.5 cm His BP is generally well-controlled although a little elevated today. I have recommended continued follow up yearly. I stressed the importance of good blood pressure control.  Plan:  I will see him back in one year with a CTA of the chest.   Alleen BorneBryan K Bartle, MD [facility] [phone]

## 2016-12-15 ENCOUNTER — Other Ambulatory Visit: Payer: Self-pay | Admitting: Cardiology

## 2017-01-07 ENCOUNTER — Other Ambulatory Visit: Payer: Self-pay | Admitting: Cardiology

## 2017-01-07 DIAGNOSIS — Q2543 Congenital aneurysm of aorta: Secondary | ICD-10-CM

## 2017-01-07 DIAGNOSIS — Q278 Other specified congenital malformations of peripheral vascular system: Secondary | ICD-10-CM

## 2017-01-07 DIAGNOSIS — I10 Essential (primary) hypertension: Secondary | ICD-10-CM

## 2017-02-14 DIAGNOSIS — E7800 Pure hypercholesterolemia, unspecified: Secondary | ICD-10-CM | POA: Diagnosis not present

## 2017-02-14 DIAGNOSIS — I719 Aortic aneurysm of unspecified site, without rupture: Secondary | ICD-10-CM | POA: Diagnosis not present

## 2017-02-14 DIAGNOSIS — Z125 Encounter for screening for malignant neoplasm of prostate: Secondary | ICD-10-CM | POA: Diagnosis not present

## 2017-02-14 DIAGNOSIS — N529 Male erectile dysfunction, unspecified: Secondary | ICD-10-CM | POA: Diagnosis not present

## 2017-02-14 DIAGNOSIS — I10 Essential (primary) hypertension: Secondary | ICD-10-CM | POA: Diagnosis not present

## 2017-04-15 DIAGNOSIS — N410 Acute prostatitis: Secondary | ICD-10-CM | POA: Diagnosis not present

## 2017-04-15 DIAGNOSIS — R350 Frequency of micturition: Secondary | ICD-10-CM | POA: Diagnosis not present

## 2017-05-02 DIAGNOSIS — Z1211 Encounter for screening for malignant neoplasm of colon: Secondary | ICD-10-CM | POA: Diagnosis not present

## 2017-06-18 ENCOUNTER — Other Ambulatory Visit: Payer: Self-pay | Admitting: Interventional Cardiology

## 2017-06-18 DIAGNOSIS — N529 Male erectile dysfunction, unspecified: Secondary | ICD-10-CM

## 2017-06-20 NOTE — Telephone Encounter (Signed)
Okay to refill? Please advise. Thanks, MI 

## 2017-06-21 ENCOUNTER — Telehealth: Payer: Self-pay | Admitting: Interventional Cardiology

## 2017-06-21 NOTE — Telephone Encounter (Signed)
Needs to get from PCP. Thanks!

## 2017-06-21 NOTE — Telephone Encounter (Signed)
New message    Pt c/o Shortness Of Breath: STAT if SOB developed within the last 24 hours or pt is noticeably SOB on the phone  1. Are you currently SOB (can you hear that pt is SOB on the phone)?  no  2. How long have you been experiencing SOB? Since Friday of last week   3. Are you SOB when sitting or when up moving around? Both   4. Are you currently experiencing any other symptoms?  Patient states breathing is erratic , then if he walks he gets sob of breath, and when he is sleeping wife, says he is having erratic breathing as well

## 2017-06-21 NOTE — Telephone Encounter (Signed)
Pt states since Thurs he has been having SOB that comes and goes.  Wife woke him up last night because his breathing was erratic and he was gasping occasionally.  Pt states last night he felt a little jittery but this has since subsided.  Pt feeling ok right now and SOB has improved today.  Denies any changes to his normal CP related to aneurysm.  Denies lightheadedness, dizziness or other pain.  States he is more fatigued and has noted some swelling on the left side of his neck.  States it reminds him lymph node swelling with sickness.  BP has been normal to high but pt feels it has been higher d/t stress at work.  Offered appt with Dr. Katrinka BlazingSmith on Friday but pt would like to be seen sooner if possible.  Pt agreeable to appt with Robbie LisBrittainy Simmons, PA-C on Thurs.  Spoke with Dr. Katrinka BlazingSmith and he is in agreement with plan for pt to be seen Thurs.  Pt appreciative for assistance.

## 2017-06-23 ENCOUNTER — Encounter: Payer: Self-pay | Admitting: Cardiology

## 2017-06-23 ENCOUNTER — Ambulatory Visit (INDEPENDENT_AMBULATORY_CARE_PROVIDER_SITE_OTHER): Payer: BLUE CROSS/BLUE SHIELD | Admitting: Cardiology

## 2017-06-23 VITALS — BP 137/83 | HR 67 | Ht 71.0 in | Wt 255.2 lb

## 2017-06-23 DIAGNOSIS — I10 Essential (primary) hypertension: Secondary | ICD-10-CM

## 2017-06-23 DIAGNOSIS — R221 Localized swelling, mass and lump, neck: Secondary | ICD-10-CM

## 2017-06-23 DIAGNOSIS — R002 Palpitations: Secondary | ICD-10-CM

## 2017-06-23 DIAGNOSIS — Q278 Other specified congenital malformations of peripheral vascular system: Secondary | ICD-10-CM | POA: Diagnosis not present

## 2017-06-23 DIAGNOSIS — Q2543 Congenital aneurysm of aorta: Secondary | ICD-10-CM | POA: Diagnosis not present

## 2017-06-23 DIAGNOSIS — R0602 Shortness of breath: Secondary | ICD-10-CM

## 2017-06-23 MED ORDER — AMLODIPINE BESYLATE 10 MG PO TABS: 10.0000 mg | ORAL_TABLET | Freq: Every day | ORAL | 5 refills | Status: DC

## 2017-06-23 MED ORDER — SILDENAFIL CITRATE 100 MG PO TABS: ORAL_TABLET | ORAL | 0 refills | Status: DC

## 2017-06-23 MED ORDER — METOPROLOL SUCCINATE ER 100 MG PO TB24: ORAL_TABLET | ORAL | 1 refills | Status: DC

## 2017-06-23 MED ORDER — LISINOPRIL-HYDROCHLOROTHIAZIDE 20-25 MG PO TABS: 1.0000 | ORAL_TABLET | Freq: Every day | ORAL | 4 refills | Status: DC

## 2017-06-23 NOTE — Progress Notes (Signed)
06/23/2017 Bryan Riggs   03-Jan-1966  [id number]  Primary Physician Clovis Riley, L.August Saucer, MD Primary Cardiologist: Dr. Katrinka Blazing   Reason for Visit/CC: Dyspnea and Palpitations   HPI: 51 year old male, followed by Dr. Katrinka Blazing, who presents to clinic today for routine follow-up as well as new complaints of dyspnea and palpitations. He is also followed by Dr. Laneta Simmers. He has a known aortic root aneurysm. This is monitored yearly by CT scan. 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 He has no h/o CAD. He does have a h/o HTN and ED.   He notes since symptoms of mild exertional dyspnea and fatigue as well as intermittent palpitations. He denies any ischemic-like chest pain. Lower extremity edema, orthopnea nor PND. He denies any sig/near-syncope. No back or abdominal pain. He is also concerned regarding swelling in his left upper neck. Patient feels like a pocket of fluid and he reports that the left side of his neck is at times "puffier" compared to the left. He denies any left arm pain. No neck pain. He is not a smoker.    Current Meds  Medication Sig  . amLODipine (NORVASC) 10 MG tablet Take 1 tablet (10 mg total) by mouth daily.  Marland Kitchen lisinopril-hydrochlorothiazide (PRINZIDE,ZESTORETIC) 20-25 MG tablet Take 1 tablet by mouth daily.  . metoprolol succinate (TOPROL-XL) 100 MG 24 hr tablet TAKE 1/2 TABLET(50 MG) BY MOUTH DAILY  . sildenafil (VIAGRA) 100 MG tablet TAKE 1 TABLET(100 MG) BY MOUTH DAILY AS NEEDED FOR ERECTILE DYSFUNCTION  . [DISCONTINUED] amLODipine (NORVASC) 10 MG tablet Take 1 tablet (10 mg total) by mouth daily.  . [DISCONTINUED] lisinopril-hydrochlorothiazide (PRINZIDE,ZESTORETIC) 20-25 MG tablet TAKE 1 TABLET BY MOUTH DAILY  . [DISCONTINUED] metoprolol succinate (TOPROL-XL) 100 MG 24 hr tablet TAKE 1/2 TABLET(50 MG) BY MOUTH DAILY  . [DISCONTINUED] sildenafil (VIAGRA) 100 MG tablet TAKE 1 TABLET(100 MG) BY MOUTH DAILY AS NEEDED FOR ERECTILE  DYSFUNCTION   Allergies  Allergen Reactions  . Sulfa Antibiotics Rash and Nausea And Vomiting    Break out, itching  . Viagra [Sildenafil Citrate] Other (See Comments)    FLUSHING    Past Medical History:  Diagnosis Date  . Aberrant subclavian artery    RIGHT...PER CT CHEST...07/20/13  . 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 Chest pain CT ANGIO CHEST 07/20/13   LED TO DISCOVERY OF SINUS OF VALSALVA  ANEURYSM  . Erectile dysfunction    DR. NESI  . Hypertension   . Kidney stone    Family History  Problem Relation Age of Onset  . Cancer Mother        BREAST CANCER  . Cancer Father        MUTIPLE MYELOMA  . Hypertension Father   . Cancer Brother        ? TYPE   Past Surgical History:  Procedure Laterality Date  . NO PAST SURGERIES     Social History   Social History  . Marital status: Married    Spouse name: N/A  . Number of children: N/A  . Years of education: N/A   Occupational History  . Not on file.   Social History Main Topics  . Smoking status: Never Smoker  . Smokeless tobacco: Never Used  . Alcohol use Yes     Comment: RARE  . Drug use: Unknown  . Sexual activity: Not on file   Other Topics Concern  .  Not on file   Social History Narrative  . No narrative on file     Review of Systems: General: negative for chills, fever, night sweats or weight changes.  Cardiovascular: negative for chest pain, dyspnea on exertion, edema, orthopnea, palpitations, paroxysmal nocturnal dyspnea or shortness of breath Dermatological: negative for rash Respiratory: negative for cough or wheezing Urologic: negative for hematuria Abdominal: negative for nausea, vomiting, diarrhea, bright red blood per rectum, melena, or hematemesis Neurologic: negative for visual changes, syncope, or dizziness All other systems reviewed and are otherwise negative except as noted above.   Physical Exam:  Blood pressure 137/83,  pulse 67, height 5\' 11"  (1.803 m), weight 255 lb 3.2 oz (115.8 kg).  General appearance: alert, cooperative and no distress Neck: no carotid bruit, no JVD, supple, symmetrical, trachea midline and mild swelling of the left lafteral neck. non tender. no masses Lungs: clear to auscultation bilaterally Heart: regular rate and rhythm, S1, S2 normal, no murmur, click, rub or gallop Extremities: extremities normal, atraumatic, no cyanosis or edema Pulses: 2+ and symmetric Skin: Skin color, texture, turgor normal. No rashes or lesions Neurologic: Grossly normal  EKG SR with marked sinus arrhythmia, 1st degree AVB and occasional PVCs -- personally reviewed   ASSESSMENT AND PLAN:   1. Dyspnea and Palpitations: mainly occurs with exertion. No LEE, orthopnea or PND. euvolemic on exam. Lungs are CTAB. EKG with sinus arrhthymia and PVCs. No ischemic like CP. We will arrange 2D echo to assess LVF and to screen for structural abnormalities. 2 week cardiac monitor to r/o significant cardiac arrhythmias  2. Aortic Root Aneurysm: Followed by Dr. Laneta SimmersBartle. His BP is well controlled. HR is controlled. No chest pain. Recent chest CT 09/2016 showed a stable 4.7 cm aneurysm. No indication for surgery at this time. Dr.  Laneta SimmersBartle has recommended repeat assessment in one year.  4. HTN: controlled on current regimen.   5. ED: likely secondary to BB use + age.  He uses Viagra.   6. Left Neck Swelling: no hard masses with palpitation. Non tender. Feels like fluid pocket. ? Cyst. Will obtain neck ultrasound.   Follow-Up with Dr. Katrinka BlazingSmith or APP after 2D echo and cardiac monitor.   Claretta Kendra Delmer IslamSimmons PA-C, MHS [facility] 06/23/2017 4:42 PM

## 2017-06-23 NOTE — Patient Instructions (Signed)
Medication Instructions:  Your physician recommends that you continue on your current medications as directed. Please refer to the Current Medication list given to you today.   Your medications have been refilled today  Labwork: None ordered  Testing/Procedures: Your physician has requested that you have an echocardiogram. Echocardiography is a painless test that uses sound waves to create images of your heart. It provides your doctor with information about the size and shape of your heart and how well your heart's chambers and valves are working. This procedure takes approximately one hour. There are no restrictions for this procedure.  Your physician has recommended that you wear an event monitor. Event monitors are medical devices that record the heart's electrical activity. Doctors most often us these monitors to diagnose arrhythmias. Arrhythmias are problems with the speed or rhythm of the heartbeat. The monitor is a small, portable device. You can wear one while you do your normal daily activities. This is usually used to diagnose what is causing palpitations/syncope (passing out).  Your physician has requested that you have soft tissue ultrasound. This test is an ultrasound of the soft tissue in your neck.There are no restrictions or special instructions. [facility] [address] 1st floor.   Follow-Up: Your physician recommends that you schedule a follow-up appointment in: 3-4 weeks with Dr.Smith   Any Other Special Instructions Will Be Listed Below (If Applicable).     If you need a refill on your cardiac medications before your next appointment, please call your pharmacy.

## 2017-07-13 ENCOUNTER — Other Ambulatory Visit (HOSPITAL_COMMUNITY): Payer: BLUE CROSS/BLUE SHIELD

## 2017-07-14 ENCOUNTER — Ambulatory Visit: Payer: BLUE CROSS/BLUE SHIELD | Admitting: Cardiology

## 2017-07-21 ENCOUNTER — Other Ambulatory Visit: Payer: Self-pay

## 2017-07-21 ENCOUNTER — Ambulatory Visit (HOSPITAL_COMMUNITY): Payer: BLUE CROSS/BLUE SHIELD | Attending: Cardiovascular Disease

## 2017-07-21 ENCOUNTER — Telehealth: Payer: Self-pay | Admitting: *Deleted

## 2017-07-21 DIAGNOSIS — Q2543 Congenital aneurysm of aorta: Secondary | ICD-10-CM | POA: Diagnosis not present

## 2017-07-21 DIAGNOSIS — I10 Essential (primary) hypertension: Secondary | ICD-10-CM | POA: Insufficient documentation

## 2017-07-21 DIAGNOSIS — R0602 Shortness of breath: Secondary | ICD-10-CM | POA: Insufficient documentation

## 2017-07-21 NOTE — Telephone Encounter (Signed)
Lmtcb to go over echo results.  

## 2017-07-21 NOTE — Telephone Encounter (Signed)
-----   Message from Allayne ButcherBrittainy M Simmons, [place]PA-C sent at 07/21/2017  4:30 PM EDT ----- Pump function is normal. No valvular abnormalities.

## 2017-07-26 ENCOUNTER — Telehealth: Payer: Self-pay | Admitting: *Deleted

## 2017-07-26 NOTE — Telephone Encounter (Signed)
-----   Message from Tarri Fullerarol M Fiato, [place] sent at 07/26/2017  9:53 AM EDT -----   ----- Message ----- From: Tarri FullerFiato, Carol M, CMA Sent: 07/21/2017   4:39 PM To: L.Lupe Carneyean Mitchell, MD  Lmtcb to go over echo results

## 2017-07-26 NOTE — Telephone Encounter (Signed)
Pt has been notified of echo results by phone with verbal understanding. Pt asked then did he need the monitor that has been ordered. I advised pt yes, since the heat monitor and echo both are 2 very different testing. I explained to the pt the echo is looking at the heart valves, pumping action and wall motion of his heart, where as the monitor is looking at electrical function of the heart (heart rhythm, heart rate). Pt is agreeable to plan of care and thanked me for the call.

## 2017-07-27 ENCOUNTER — Other Ambulatory Visit: Payer: Self-pay | Admitting: Cardiology

## 2017-07-27 ENCOUNTER — Ambulatory Visit
Admission: RE | Admit: 2017-07-27 | Discharge: 2017-07-27 | Disposition: A | Discharge status: Home / Self Care | Payer: BLUE CROSS/BLUE SHIELD | Source: Ambulatory Visit | Attending: Cardiology | Admitting: Cardiology

## 2017-07-27 ENCOUNTER — Ambulatory Visit: Payer: BLUE CROSS/BLUE SHIELD

## 2017-07-27 DIAGNOSIS — R221 Localized swelling, mass and lump, neck: Secondary | ICD-10-CM

## 2017-08-04 ENCOUNTER — Telehealth: Payer: Self-pay | Admitting: Cardiology

## 2017-08-04 NOTE — Telephone Encounter (Signed)
New message    Patient returning call from several days ago. Please call

## 2017-08-04 NOTE — Telephone Encounter (Signed)
I returned pt's call. Pt states someone called him yesterday and today. I advised pt that I was not in the office yesterday and I did not call him today. I advised pt I am not sure who called him. Pt thanked me for at least calling him back.

## 2017-08-10 ENCOUNTER — Encounter: Payer: Self-pay | Admitting: Interventional Cardiology

## 2017-08-16 DIAGNOSIS — L989 Disorder of the skin and subcutaneous tissue, unspecified: Secondary | ICD-10-CM | POA: Diagnosis not present

## 2017-08-16 DIAGNOSIS — N529 Male erectile dysfunction, unspecified: Secondary | ICD-10-CM | POA: Diagnosis not present

## 2017-08-16 DIAGNOSIS — I10 Essential (primary) hypertension: Secondary | ICD-10-CM | POA: Diagnosis not present

## 2017-08-19 ENCOUNTER — Encounter: Payer: Self-pay | Admitting: Interventional Cardiology

## 2017-08-19 ENCOUNTER — Ambulatory Visit (INDEPENDENT_AMBULATORY_CARE_PROVIDER_SITE_OTHER): Payer: BLUE CROSS/BLUE SHIELD | Admitting: Interventional Cardiology

## 2017-08-19 VITALS — BP 110/88 | HR 69 | Ht 71.5 in | Wt 249.8 lb

## 2017-08-19 DIAGNOSIS — I10 Essential (primary) hypertension: Secondary | ICD-10-CM

## 2017-08-19 DIAGNOSIS — Q278 Other specified congenital malformations of peripheral vascular system: Secondary | ICD-10-CM

## 2017-08-19 DIAGNOSIS — R0789 Other chest pain: Secondary | ICD-10-CM

## 2017-08-19 DIAGNOSIS — Q2543 Congenital aneurysm of aorta: Secondary | ICD-10-CM | POA: Diagnosis not present

## 2017-08-19 DIAGNOSIS — N5201 Erectile dysfunction due to arterial insufficiency: Secondary | ICD-10-CM | POA: Diagnosis not present

## 2017-08-19 NOTE — Patient Instructions (Signed)

## 2017-08-19 NOTE — Progress Notes (Signed)
Cardiology Office Note    Date:  08/19/2017   [name], DOB 1966/08/07, MRN [id number]  PCP:  Asencion Gowda.August Saucer, MD  Cardiologist: Lesleigh Noe, MD   Chief Complaint  Patient presents with  . Thoracic Aortic Aneurysm    Sinus of Valsalva aneurysm  . Chest Pain    History of Present Illness:  Bryan Riggs is a 51 y.o. male follow-up of essential hypertension, sinus of Valsalva aneurysm, recurring chest discomfort, and hyperlipidemia.  Chest discomfort at [facility] was having when he saw [place] PA-C has dissipated. Echocardiogram was essentially unremarkable. Sinus of Valsalva aneurysm was not identified. No aortic regurgitation was noted. Aortic root was not enlarged. He has had no recurrence of the discomfort. He has back to typical activities. He denies chest pain and dyspnea.   Past Medical History:  Diagnosis Date  . Aberrant subclavian artery    RIGHT...PER CT CHEST...07/20/13  . 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 Chest pain CT ANGIO CHEST 07/20/13   LED TO DISCOVERY OF SINUS OF VALSALVA  ANEURYSM  . Erectile dysfunction    DR. NESI  . Hypertension   . Kidney stone     Past Surgical History:  Procedure Laterality Date  . NO PAST SURGERIES      Current Medications: Outpatient Medications Prior to Visit  Medication Sig Dispense Refill  . amLODipine (NORVASC) 10 MG tablet Take 1 tablet (10 mg total) by mouth daily. 30 tablet 5  . lisinopril-hydrochlorothiazide (PRINZIDE,ZESTORETIC) 20-25 MG tablet Take 1 tablet by mouth daily. 30 tablet 4  . metoprolol succinate (TOPROL-XL) 100 MG 24 hr tablet TAKE 1/2 TABLET(50 MG) BY MOUTH DAILY 45 tablet 1  . sildenafil (VIAGRA) 100 MG tablet TAKE 1 TABLET(100 MG) BY MOUTH DAILY AS NEEDED FOR ERECTILE DYSFUNCTION 15 tablet 0   No facility-administered medications prior to visit.      Allergies:   Sulfa antibiotics and Viagra [sildenafil citrate]    Social History   Social History  . Marital status: Married    Spouse name: N/A  . Number of children: N/A  . Years of education: N/A   Social History Main Topics  . Smoking status: Never Smoker  . Smokeless tobacco: Never Used  . Alcohol use Yes     Comment: RARE  . Drug use: No  . Sexual activity: Not Asked   Other Topics Concern  . None   Social History Narrative  . None     Family History:  The patient's family history includes Cancer in his brother, father, and mother; Hypertension in his father.   ROS:   Please see the history of present illness.    Still concerned about soft area fullness in the left supraclavicular area. CT scanning did not demonstrate a mass.  All other systems reviewed and are negative.   PHYSICAL EXAM:   VS:  BP 110/88 (BP Location: Left Arm)   Pulse 69   Ht 5' 11.5" (1.816 m)   Wt 249 lb 12.8 oz (113.3 kg)   BMI 34.35 kg/m    GEN: Well nourished, well developed, in no acute distress  HEENT: normal  Neck: no JVD, carotid bruits, or masses Cardiac: RRR; no murmurs, rubs, or gallops,no edema  Respiratory:  clear to auscultation bilaterally, normal work of breathing GI: soft, nontender, nondistended, + BS MS: no deformity or atrophy  Skin: warm and dry, no rash Neuro:  Alert and Oriented x 3, Strength and sensation are intact Psych: euthymic mood, full affect  Wt Readings from Last 3 Encounters:  08/19/17 249 lb 12.8 oz (113.3 kg)  06/23/17 255 lb 3.2 oz (115.8 kg)  09/29/16 250 lb (113.4 kg)      Studies/Labs Reviewed:   EKG:  EKG  Not repeated  Recent Labs: No results found for requested labs within last 8760 hours.   Lipid Panel No results found for: CHOL, TRIG, HDL, CHOLHDL, VLDL, LDLCALC, LDLDIRECT  Additional studies/ records that were reviewed today include:  Echocardiogram performed 07/21/2017: Study Conclusions   - Left ventricle: The cavity size was normal. There was mild   concentric hypertrophy. Systolic  function was normal. The   estimated ejection fraction was in the range of 55% to 60%. Wall   motion was normal; there were no regional wall motion   abnormalities. Doppler parameters are consistent with abnormal   left ventricular relaxation (grade 1 diastolic dysfunction). - Aortic valve: There was trivial regurgitation. - Left atrium: The atrium was mildly dilated. - Right ventricle: The cavity size was mildly dilated. Wall   thickness was normal.   Impressions:   - PA pressure could not be estimated, but 2D findings suggest   possible pulmonary HTN.  CT ANGIO of the chest and aorta: November 2017 IMPRESSION: Stable aneurysm at the sinus of Valsalva measuring up to 4.7 cm.   Stenosis involving the left innominate/subclavian vein. This narrowing may be exacerbated by left arm positioning during the scan.   Aberrant right subclavian vein.   Patchy ground-glass densities in the lungs probably related to areas of volume loss.   Stable small pulmonary nodules.    ASSESSMENT:    1. 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 Essential hypertension   3. Aberrant subclavian artery   4. Erectile dysfunction due to arterial insufficiency      PLAN:  In order of problems listed above:  1. 2-D echocardiography does not identify the sinus of Valsalva aneurysm. Echo is structurally normal. 2. Excellent control on the current regimen. He is compliant. 3. .Addressed 4. Uses PDE 5 inhibitor therapy. 5. Resolved spontaneously and has not recurred with typical activity. Plan clinical observation.  Clinical follow-up in one year. Continue medications as listed. Will see Dr. Laneta Simmers in November.  Medication Adjustments/Labs and Tests Ordered: Current medicines are reviewed at length with the patient today.  Concerns regarding medicines are outlined above.  Medication changes, Labs and Tests ordered today are listed in the Patient Instructions below. Patient  Instructions  Medication Instructions:  Your physician recommends that you continue on your current medications as directed. Please refer to the Current Medication list given to you today.   Labwork: None  Testing/Procedures: None  Follow-Up: Your physician wants you to follow-up in: 1 year with Dr. Katrinka Blazing.  You will receive a reminder letter in the mail two months in advance. If you don't receive a letter, please call our office to schedule the follow-up appointment.   Any Other Special Instructions Will Be Listed Below (If Applicable).     If you need a refill on your cardiac medications before your next appointment, please call your pharmacy.      Signed, Lesleigh Noe, MD  08/19/2017 4:17 PM    [facility] [address] [place], [place], [place]  [address] Phone: [phone]; Fax: [phone]

## 2017-08-22 DIAGNOSIS — R229 Localized swelling, mass and lump, unspecified: Secondary | ICD-10-CM | POA: Diagnosis not present

## 2017-08-22 DIAGNOSIS — M25531 Pain in right wrist: Secondary | ICD-10-CM | POA: Diagnosis not present

## 2017-08-30 ENCOUNTER — Other Ambulatory Visit: Payer: Self-pay | Admitting: Orthopedic Surgery

## 2017-08-30 DIAGNOSIS — R229 Localized swelling, mass and lump, unspecified: Principal | ICD-10-CM

## 2017-08-30 DIAGNOSIS — IMO0002 Reserved for concepts with insufficient information to code with codable children: Secondary | ICD-10-CM

## 2017-08-30 DIAGNOSIS — M25531 Pain in right wrist: Secondary | ICD-10-CM

## 2017-09-09 ENCOUNTER — Other Ambulatory Visit: Payer: Self-pay | Admitting: *Deleted

## 2017-09-09 DIAGNOSIS — I712 Thoracic aortic aneurysm, without rupture, unspecified: Secondary | ICD-10-CM

## 2017-09-14 ENCOUNTER — Ambulatory Visit
Admission: RE | Admit: 2017-09-14 | Discharge: 2017-09-14 | Disposition: A | Discharge status: Home / Self Care | Payer: BLUE CROSS/BLUE SHIELD | Source: Ambulatory Visit | Attending: Orthopedic Surgery | Admitting: Orthopedic Surgery

## 2017-09-14 DIAGNOSIS — M25531 Pain in right wrist: Secondary | ICD-10-CM

## 2017-09-14 DIAGNOSIS — M19031 Primary osteoarthritis, right wrist: Secondary | ICD-10-CM | POA: Diagnosis not present

## 2017-09-14 DIAGNOSIS — R229 Localized swelling, mass and lump, unspecified: Principal | ICD-10-CM

## 2017-09-14 DIAGNOSIS — IMO0002 Reserved for concepts with insufficient information to code with codable children: Secondary | ICD-10-CM

## 2017-09-14 MED ORDER — IOPAMIDOL (ISOVUE-M 200) INJECTION 41%
20.0000 mL | Freq: Once | INTRAMUSCULAR | Status: AC
  Administered 2017-09-14: 20 mL via INTRA_ARTICULAR

## 2017-09-23 DIAGNOSIS — M25531 Pain in right wrist: Secondary | ICD-10-CM | POA: Diagnosis not present

## 2017-09-23 DIAGNOSIS — R229 Localized swelling, mass and lump, unspecified: Secondary | ICD-10-CM | POA: Diagnosis not present

## 2017-10-12 ENCOUNTER — Ambulatory Visit
Admission: RE | Admit: 2017-10-12 | Discharge: 2017-10-12 | Disposition: A | Discharge status: Home / Self Care | Payer: BLUE CROSS/BLUE SHIELD | Source: Ambulatory Visit | Attending: Surgery | Admitting: Surgery

## 2017-10-12 ENCOUNTER — Encounter: Payer: Self-pay | Admitting: Surgery

## 2017-10-12 ENCOUNTER — Other Ambulatory Visit: Payer: Self-pay

## 2017-10-12 ENCOUNTER — Ambulatory Visit (INDEPENDENT_AMBULATORY_CARE_PROVIDER_SITE_OTHER): Payer: BLUE CROSS/BLUE SHIELD | Admitting: Surgery

## 2017-10-12 VITALS — BP 158/111 | HR 78 | Ht 71.5 in | Wt 255.0 lb

## 2017-10-12 DIAGNOSIS — I712 Thoracic aortic aneurysm, without rupture, unspecified: Secondary | ICD-10-CM

## 2017-10-12 MED ORDER — IOPAMIDOL (ISOVUE-370) INJECTION 76%
75.0000 mL | Freq: Once | INTRAVENOUS | Status: AC | PRN
  Administered 2017-10-12: 75 mL via INTRAVENOUS

## 2017-10-16 ENCOUNTER — Encounter: Payer: Self-pay | Admitting: Surgery

## 2017-10-16 NOTE — Progress Notes (Signed)
HPI:  The patient returns today for follow up of his aortic root aneurysm. When I saw him one year ago it was 47 mm at the sinus of valsalva and unchanged from the prior year. 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 He had an echo on 08/10/2013 that showed mild dilatation of the aortic sinuses to 46 mm with normal tricuspid aortic valve function and normal LV. An echo in 2012 reportedly showed a sinus diameter of 46 mm. He has not had any significant chest pain or shortness of breath over the past year.    Current Outpatient Medications  Medication Sig Dispense Refill  . amLODipine (NORVASC) 10 MG tablet Take 1 tablet (10 mg total) by mouth daily. 30 tablet 5  . lisinopril-hydrochlorothiazide (PRINZIDE,ZESTORETIC) 20-25 MG tablet Take 1 tablet by mouth daily. 30 tablet 4  . metoprolol succinate (TOPROL-XL) 100 MG 24 hr tablet TAKE 1/2 TABLET(50 MG) BY MOUTH DAILY 45 tablet 1  . sildenafil (VIAGRA) 100 MG tablet TAKE 1 TABLET(100 MG) BY MOUTH DAILY AS NEEDED FOR ERECTILE DYSFUNCTION 15 tablet 0   No current facility-administered medications for this visit.      Physical Exam: BP (!) 158/111 (BP Location: Left Arm, Patient Position: Sitting, Cuff Size: Large)   Pulse 78   Ht 5' 11.5" (1.816 m)   Wt 255 lb (115.7 kg)   SpO2 98%   BMI 35.07 kg/m  He looks well Cardiac exam shows a regular rate and rhythm with normal heart sounds. There is no murmur. Lungs are clear   Diagnostic Tests:  CLINICAL DATA:  Thoracic aortic aneurysm without rupture  EXAM: CT ANGIOGRAPHY CHEST WITH CONTRAST  TECHNIQUE: Multidetector CT imaging of the chest was performed using the standard protocol during bolus administration of intravenous contrast. Multiplanar CT image reconstructions and MIPs were obtained to evaluate the vascular anatomy.  CONTRAST:  75mL ISOVUE-370 IOPAMIDOL (ISOVUE-370) INJECTION 76%  COMPARISON:   09/29/2016  FINDINGS: Cardiovascular: Stable aneurysmal dilatation of the proximal aorta. Aortic root diameter at the sinus of Valsalva is 4.6 cm, unchanged. Diameter at the sino-tubular junction 3.2 cm, unchanged. Proximal ascending thoracic aorta measures 3.8 cm, unchanged. No associated dissection. 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 Major branch vessels are tortuous but patent.  No mediastinal hemorrhage or hematoma. Normal heart size. No pericardial effusion.  Central pulmonary arteries are patent. No significant filling defect or pulmonary embolus demonstrated by CTA.  There is re- demonstration of a prominent mildly dilated left superior intercostal vein extends to the hemi azygous system and drains to the left renal vein. Findings suggest stenosis of the left subclavian/innominate venous junction as before. No central occlusion. SVC remains patent.  Mediastinum/Nodes: No enlarged mediastinal, hilar, or axillary lymph nodes. Thyroid gland, trachea, and esophagus demonstrate no significant findings.  Lungs/Pleura: Minor basilar atelectasis. Lungs remain clear. No focal pneumonia, collapse or consolidation. Trachea central airways are patent. No pleural abnormality, effusion or pneumothorax.  Upper Abdomen: No acute abnormality.  Musculoskeletal: No acute osseous finding.  Review of the MIP images confirms the above findings.  IMPRESSION: Stable mild aneurysm of the sinus Valsalva measuring up to 4.6 cm.  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  Aortic aneurysm NOS (ICD10-I71.9).   Electronically Signed  By: Osvaldo ShipperM.  Shick M.D.   On: 10/12/2017 14:10   Impression:  He has  a stable aortic root aneurysm that measures 4.6 cm today. This has been stable since  2012 when it was 4.6 cm. This is still well below the threshold for surgical treatment of 5.5 cm His BP is elevated today at 158/111. I stressed the importance of good blood pressure control in preventing enlargement of his aneurysm and aortic dissection. He is going to discuss this further with his PCP.  Plan:  I will see him back in one year with a CTA of the chest.   I spent 15 minutes performing this established patient evaluation and > 50% of this time was spent face to face counseling and coordinating the care of this patient's aortic aneurysm.   Alleen BorneBryan K Bartle, MD [facility] [phone]

## 2017-12-15 ENCOUNTER — Other Ambulatory Visit: Payer: Self-pay | Admitting: Interventional Cardiology

## 2017-12-15 DIAGNOSIS — N529 Male erectile dysfunction, unspecified: Secondary | ICD-10-CM

## 2017-12-16 NOTE — Telephone Encounter (Signed)
Should this be deferred to pcp? 

## 2017-12-16 NOTE — Telephone Encounter (Signed)
Yes please. Thanks. 

## 2017-12-19 ENCOUNTER — Telehealth: Payer: Self-pay | Admitting: Interventional Cardiology

## 2017-12-19 NOTE — Telephone Encounter (Signed)
Defer to PCP

## 2017-12-19 NOTE — Telephone Encounter (Signed)
Pt's pharmacy [facility] is requesting a refill on Sildenafil 100 mg tablet. Would you like to refill this medication? Please advise

## 2017-12-19 NOTE — Telephone Encounter (Signed)
Call pt's pharmacy [facility] to informed them that pt will have to refill his Sildenafil 100 mg tablet with pt's PCP. Pharmacy verbalized understanding.

## 2017-12-26 DIAGNOSIS — R350 Frequency of micturition: Secondary | ICD-10-CM | POA: Diagnosis not present

## 2017-12-26 DIAGNOSIS — N410 Acute prostatitis: Secondary | ICD-10-CM | POA: Diagnosis not present

## 2017-12-26 DIAGNOSIS — N5201 Erectile dysfunction due to arterial insufficiency: Secondary | ICD-10-CM | POA: Diagnosis not present

## 2017-12-26 DIAGNOSIS — N401 Enlarged prostate with lower urinary tract symptoms: Secondary | ICD-10-CM | POA: Diagnosis not present

## 2017-12-26 DIAGNOSIS — R3121 Asymptomatic microscopic hematuria: Secondary | ICD-10-CM | POA: Diagnosis not present

## 2017-12-30 ENCOUNTER — Telehealth: Payer: Self-pay | Admitting: Interventional Cardiology

## 2017-12-30 NOTE — Telephone Encounter (Signed)
Pt calling requesting a refill on sildenafil. Would you like to refill this medication? Please address

## 2018-01-02 DIAGNOSIS — R05 Cough: Secondary | ICD-10-CM | POA: Diagnosis not present

## 2018-01-03 NOTE — Telephone Encounter (Signed)
Called pt and left message for pt to call back to inform him per Constance HolsterJennifer Bowers, LPN, Dr. Michaelle CopasSmith's nurse that pt need to refer to PCP for refill on sildenafil.

## 2018-01-03 NOTE — Telephone Encounter (Signed)
Defer to PCP

## 2018-01-03 NOTE — Telephone Encounter (Signed)
Called pt to inform him per Constance HolsterJennifer Bowers, LPN, Dr. Michaelle CopasSmith's nurse, that pt will have to refer to his PCP to get refill on Sildenafil and if he has any other problems, questions or concerns to call the office. Pt verbalized understanding.

## 2018-01-12 DIAGNOSIS — H52223 Regular astigmatism, bilateral: Secondary | ICD-10-CM | POA: Diagnosis not present

## 2018-02-15 DIAGNOSIS — N529 Male erectile dysfunction, unspecified: Secondary | ICD-10-CM | POA: Diagnosis not present

## 2018-02-15 DIAGNOSIS — I10 Essential (primary) hypertension: Secondary | ICD-10-CM | POA: Diagnosis not present

## 2018-02-15 DIAGNOSIS — E7800 Pure hypercholesterolemia, unspecified: Secondary | ICD-10-CM | POA: Diagnosis not present

## 2018-03-14 ENCOUNTER — Other Ambulatory Visit: Payer: Self-pay

## 2018-03-14 MED ORDER — AMLODIPINE BESYLATE 10 MG PO TABS: 10.0000 mg | ORAL_TABLET | Freq: Every day | ORAL | 3 refills | Status: DC

## 2018-06-15 DIAGNOSIS — N179 Acute kidney failure, unspecified: Secondary | ICD-10-CM

## 2018-06-15 HISTORY — DX: Acute kidney failure, unspecified: N17.9

## 2018-06-20 ENCOUNTER — Emergency Department (HOSPITAL_COMMUNITY): Payer: BLUE CROSS/BLUE SHIELD

## 2018-06-20 ENCOUNTER — Encounter (HOSPITAL_COMMUNITY): Payer: Self-pay

## 2018-06-20 ENCOUNTER — Inpatient Hospital Stay (HOSPITAL_COMMUNITY)
Admission: EM | Admit: 2018-06-20 | Discharge: 2018-06-30 | DRG: 918 | Disposition: A | Discharge status: Home / Self Care | Payer: BLUE CROSS/BLUE SHIELD | Attending: Internal Medicine | Admitting: Internal Medicine

## 2018-06-20 DIAGNOSIS — N189 Chronic kidney disease, unspecified: Secondary | ICD-10-CM

## 2018-06-20 DIAGNOSIS — N179 Acute kidney failure, unspecified: Secondary | ICD-10-CM | POA: Diagnosis present

## 2018-06-20 DIAGNOSIS — T50902A Poisoning by unspecified drugs, medicaments and biological substances, intentional self-harm, initial encounter: Secondary | ICD-10-CM | POA: Diagnosis not present

## 2018-06-20 DIAGNOSIS — Z803 Family history of malignant neoplasm of breast: Secondary | ICD-10-CM

## 2018-06-20 DIAGNOSIS — F329 Major depressive disorder, single episode, unspecified: Secondary | ICD-10-CM | POA: Diagnosis not present

## 2018-06-20 DIAGNOSIS — T518X2A Toxic effect of other alcohols, intentional self-harm, initial encounter: Secondary | ICD-10-CM | POA: Diagnosis present

## 2018-06-20 DIAGNOSIS — Q2543 Congenital aneurysm of aorta: Secondary | ICD-10-CM | POA: Diagnosis not present

## 2018-06-20 DIAGNOSIS — Z807 Family history of other malignant neoplasms of lymphoid, hematopoietic and related tissues: Secondary | ICD-10-CM | POA: Diagnosis not present

## 2018-06-20 DIAGNOSIS — T39312A Poisoning by propionic acid derivatives, intentional self-harm, initial encounter: Principal | ICD-10-CM | POA: Diagnosis present

## 2018-06-20 DIAGNOSIS — N390 Urinary tract infection, site not specified: Secondary | ICD-10-CM | POA: Diagnosis not present

## 2018-06-20 DIAGNOSIS — Z8249 Family history of ischemic heart disease and other diseases of the circulatory system: Secondary | ICD-10-CM | POA: Diagnosis not present

## 2018-06-20 DIAGNOSIS — G4700 Insomnia, unspecified: Secondary | ICD-10-CM | POA: Diagnosis not present

## 2018-06-20 DIAGNOSIS — T391X2A Poisoning by 4-Aminophenol derivatives, intentional self-harm, initial encounter: Secondary | ICD-10-CM | POA: Diagnosis present

## 2018-06-20 DIAGNOSIS — E876 Hypokalemia: Secondary | ICD-10-CM | POA: Diagnosis not present

## 2018-06-20 DIAGNOSIS — I719 Aortic aneurysm of unspecified site, without rupture: Secondary | ICD-10-CM | POA: Diagnosis present

## 2018-06-20 DIAGNOSIS — N183 Chronic kidney disease, stage 3 (moderate): Secondary | ICD-10-CM | POA: Diagnosis present

## 2018-06-20 DIAGNOSIS — R1031 Right lower quadrant pain: Secondary | ICD-10-CM | POA: Diagnosis not present

## 2018-06-20 DIAGNOSIS — E872 Acidosis: Secondary | ICD-10-CM | POA: Diagnosis not present

## 2018-06-20 DIAGNOSIS — R450 Nervousness: Secondary | ICD-10-CM | POA: Diagnosis not present

## 2018-06-20 DIAGNOSIS — F32A Depression, unspecified: Secondary | ICD-10-CM

## 2018-06-20 DIAGNOSIS — K5900 Constipation, unspecified: Secondary | ICD-10-CM | POA: Diagnosis present

## 2018-06-20 DIAGNOSIS — Z4901 Encounter for fitting and adjustment of extracorporeal dialysis catheter: Secondary | ICD-10-CM | POA: Diagnosis not present

## 2018-06-20 DIAGNOSIS — I10 Essential (primary) hypertension: Secondary | ICD-10-CM | POA: Diagnosis present

## 2018-06-20 DIAGNOSIS — I129 Hypertensive chronic kidney disease with stage 1 through stage 4 chronic kidney disease, or unspecified chronic kidney disease: Secondary | ICD-10-CM | POA: Diagnosis present

## 2018-06-20 DIAGNOSIS — R109 Unspecified abdominal pain: Secondary | ICD-10-CM | POA: Diagnosis not present

## 2018-06-20 HISTORY — DX: Acute kidney failure, unspecified: N17.9

## 2018-06-20 HISTORY — DX: Personal history of urinary calculi: Z87.442

## 2018-06-20 LAB — COMPREHENSIVE METABOLIC PANEL
ALT: 27 U/L (ref 0–44)
ANION GAP: 17 — AB (ref 5–15)
AST: 24 U/L (ref 15–41)
Albumin: 3.6 g/dL (ref 3.5–5.0)
Alkaline Phosphatase: 76 U/L (ref 38–126)
BILIRUBIN TOTAL: 1.4 mg/dL — AB (ref 0.3–1.2)
BUN: 51 mg/dL — ABNORMAL HIGH (ref 6–20)
CALCIUM: 8.6 mg/dL — AB (ref 8.9–10.3)
CO2: 21 mmol/L — ABNORMAL LOW (ref 22–32)
Chloride: 103 mmol/L (ref 98–111)
Creatinine, Ser: 9.2 mg/dL — ABNORMAL HIGH (ref 0.61–1.24)
GFR calc non Af Amer: 6 mL/min — ABNORMAL LOW (ref >60)
GFR, EST AFRICAN AMERICAN: 7 mL/min — AB (ref >60)
GLUCOSE: 95 mg/dL (ref 70–99)
Potassium: 3 mmol/L — ABNORMAL LOW (ref 3.5–5.1)
Sodium: 141 mmol/L (ref 135–145)
TOTAL PROTEIN: 6.7 g/dL (ref 6.5–8.1)

## 2018-06-20 LAB — CBC WITH DIFFERENTIAL/PLATELET
Abs Immature Granulocytes: 0 10*3/uL (ref 0.0–0.1)
BASOS PCT: 0 %
Basophils Absolute: 0 10*3/uL (ref 0.0–0.1)
EOS ABS: 0.1 10*3/uL (ref 0.0–0.7)
Eosinophils Relative: 1 %
HCT: 51.1 % (ref 39.0–52.0)
Hemoglobin: 17.2 g/dL — ABNORMAL HIGH (ref 13.0–17.0)
IMMATURE GRANULOCYTES: 0 %
Lymphocytes Relative: 16 %
Lymphs Abs: 1.3 10*3/uL (ref 0.7–4.0)
MCH: 30 pg (ref 26.0–34.0)
MCHC: 33.7 g/dL (ref 30.0–36.0)
MCV: 89.2 fL (ref 78.0–100.0)
MONOS PCT: 13 %
Monocytes Absolute: 1 10*3/uL (ref 0.1–1.0)
NEUTROS PCT: 70 %
Neutro Abs: 5.4 10*3/uL (ref 1.7–7.7)
PLATELETS: 198 10*3/uL (ref 150–400)
RBC: 5.73 MIL/uL (ref 4.22–5.81)
RDW: 13.1 % (ref 11.5–15.5)
WBC: 7.8 10*3/uL (ref 4.0–10.5)

## 2018-06-20 LAB — URINALYSIS, ROUTINE W REFLEX MICROSCOPIC
BILIRUBIN URINE: NEGATIVE
GLUCOSE, UA: NEGATIVE mg/dL
KETONES UR: NEGATIVE mg/dL
NITRITE: NEGATIVE
PH: 5 (ref 5.0–8.0)
Protein, ur: 100 mg/dL — AB
SPECIFIC GRAVITY, URINE: 1.009 (ref 1.005–1.030)
WBC, UA: >50 WBC/hpf — ABNORMAL HIGH (ref 0–5)

## 2018-06-20 LAB — SALICYLATE LEVEL: Salicylate Lvl: <7 mg/dL (ref 2.8–30.0)

## 2018-06-20 LAB — ACETAMINOPHEN LEVEL: Acetaminophen (Tylenol), Serum: <10 ug/mL — ABNORMAL LOW (ref 10–30)

## 2018-06-20 LAB — LIPASE, BLOOD: LIPASE: 31 U/L (ref 11–51)

## 2018-06-20 MED ORDER — ONDANSETRON 4 MG PO TBDP
4.0000 mg | ORAL_TABLET | Freq: Once | ORAL | Status: AC
  Administered 2018-06-20: 4 mg via ORAL
  Filled 2018-06-20: qty 1

## 2018-06-20 NOTE — ED Triage Notes (Signed)
Pt presents with dizziness, blurred vision, abdominal pain and R flank pain x 2-3 days.  Pt reports 3 weeks ago, he "was in a bad place", took large amounts of sleep aide, tylenol, aleve and drank antifreeze.  Pt reports now, he is not suicidal anymore, reports onset of vomiting and loose stools, no appetite and malaise.

## 2018-06-20 NOTE — ED Provider Notes (Addendum)
Patient placed in Quick Look pathway, seen and evaluated   Chief Complaint: 3 flank pain, nausea, vomiting  HPI:   Patient presents with a one-week history of progressively worsening right-sided flank pain which occasionally radiates to the right testicle.  He notes multiple episodes of nonbloody nonbilious emesis over the past week or so additionally.  Also notes a few episodes of watery nonbloody loose stools.  He states that 3 weeks ago he was "in a bad place "and took a large amount of sleep aid, Tylenol, Aleve, and drink antifreeze.  He denies suicidal ideation at this time.  Denies urinary frequency, urgency, dysuria, hematuria, melena, hematochezia, chest pain, or shortness of breath.  He does endorse intermittent lightheadedness but denies syncope.  Denies saddle anesthesia, bowel or bladder incontinence, IV drug use, or fevers.  Did not take his blood pressure medicines today.  ROS: Positive for lightheadedness, flank pain, chills, nausea, vomiting, diarrhea Negative for abdominal pain, chest pain, shortness of breath, numbness, weakness  Physical Exam:   Gen: No distress though appears mildly uncomfortable  Neuro: Awake and Alert  Skin: Warm    Focused Exam: No midline spine tenderness, right-sided paralumbar muscle tenderness and right CVA tenderness present.  Abdomen is soft and nontender.  Active bowel sounds noted.  Lungs clear to auscultation, heart regular rate and rhythm.  No murmurs rubs or gallops noted.   Initiation of care has begun. The patient has been counseled on the process, plan, and necessity for staying for the completion/evaluation, and the remainder of the medical screening examination     Jeanie SewerFawze, Brigit Doke A, PA-C 06/20/18 1825    Little, Ambrose Finlandachel Morgan, MD 06/21/18 [phone]

## 2018-06-21 ENCOUNTER — Other Ambulatory Visit: Payer: Self-pay

## 2018-06-21 ENCOUNTER — Encounter (HOSPITAL_COMMUNITY): Payer: Self-pay | Admitting: General Practice

## 2018-06-21 DIAGNOSIS — F32A Depression, unspecified: Secondary | ICD-10-CM

## 2018-06-21 DIAGNOSIS — F329 Major depressive disorder, single episode, unspecified: Secondary | ICD-10-CM

## 2018-06-21 DIAGNOSIS — R450 Nervousness: Secondary | ICD-10-CM | POA: Diagnosis not present

## 2018-06-21 DIAGNOSIS — I10 Essential (primary) hypertension: Secondary | ICD-10-CM | POA: Diagnosis not present

## 2018-06-21 DIAGNOSIS — Q2543 Congenital aneurysm of aorta: Secondary | ICD-10-CM | POA: Diagnosis not present

## 2018-06-21 DIAGNOSIS — N189 Chronic kidney disease, unspecified: Secondary | ICD-10-CM

## 2018-06-21 DIAGNOSIS — T39312A Poisoning by propionic acid derivatives, intentional self-harm, initial encounter: Secondary | ICD-10-CM | POA: Diagnosis not present

## 2018-06-21 DIAGNOSIS — I129 Hypertensive chronic kidney disease with stage 1 through stage 4 chronic kidney disease, or unspecified chronic kidney disease: Secondary | ICD-10-CM | POA: Diagnosis present

## 2018-06-21 DIAGNOSIS — E876 Hypokalemia: Secondary | ICD-10-CM | POA: Diagnosis present

## 2018-06-21 DIAGNOSIS — N390 Urinary tract infection, site not specified: Secondary | ICD-10-CM | POA: Diagnosis not present

## 2018-06-21 DIAGNOSIS — N183 Chronic kidney disease, stage 3 (moderate): Secondary | ICD-10-CM

## 2018-06-21 DIAGNOSIS — G4700 Insomnia, unspecified: Secondary | ICD-10-CM

## 2018-06-21 DIAGNOSIS — I719 Aortic aneurysm of unspecified site, without rupture: Secondary | ICD-10-CM | POA: Diagnosis present

## 2018-06-21 DIAGNOSIS — N179 Acute kidney failure, unspecified: Secondary | ICD-10-CM | POA: Diagnosis not present

## 2018-06-21 DIAGNOSIS — T50902A Poisoning by unspecified drugs, medicaments and biological substances, intentional self-harm, initial encounter: Secondary | ICD-10-CM | POA: Diagnosis present

## 2018-06-21 DIAGNOSIS — E872 Acidosis: Secondary | ICD-10-CM | POA: Diagnosis not present

## 2018-06-21 DIAGNOSIS — T518X2A Toxic effect of other alcohols, intentional self-harm, initial encounter: Secondary | ICD-10-CM | POA: Diagnosis present

## 2018-06-21 DIAGNOSIS — Z4901 Encounter for fitting and adjustment of extracorporeal dialysis catheter: Secondary | ICD-10-CM | POA: Diagnosis not present

## 2018-06-21 DIAGNOSIS — Z803 Family history of malignant neoplasm of breast: Secondary | ICD-10-CM | POA: Diagnosis not present

## 2018-06-21 DIAGNOSIS — K5900 Constipation, unspecified: Secondary | ICD-10-CM | POA: Diagnosis present

## 2018-06-21 DIAGNOSIS — Z8249 Family history of ischemic heart disease and other diseases of the circulatory system: Secondary | ICD-10-CM | POA: Diagnosis not present

## 2018-06-21 DIAGNOSIS — Z807 Family history of other malignant neoplasms of lymphoid, hematopoietic and related tissues: Secondary | ICD-10-CM | POA: Diagnosis not present

## 2018-06-21 DIAGNOSIS — T391X2A Poisoning by 4-Aminophenol derivatives, intentional self-harm, initial encounter: Secondary | ICD-10-CM | POA: Diagnosis present

## 2018-06-21 LAB — CK: CK TOTAL: 278 U/L (ref 49–397)

## 2018-06-21 LAB — VOLATILES,BLD-ACETONE,ETHANOL,ISOPROP,METHANOL
ACETONE, BLOOD: NEGATIVE % (ref 0.000–0.010)
ETHANOL, BLOOD: NEGATIVE % (ref 0.000–0.010)
Isopropanol, blood: NEGATIVE % (ref 0.000–0.010)
METHANOL, BLOOD: NEGATIVE % (ref 0.000–0.010)

## 2018-06-21 LAB — BASIC METABOLIC PANEL
ANION GAP: 15 (ref 5–15)
BUN: 57 mg/dL — ABNORMAL HIGH (ref 6–20)
CALCIUM: 8 mg/dL — AB (ref 8.9–10.3)
CO2: 22 mmol/L (ref 22–32)
CREATININE: 10.92 mg/dL — AB (ref 0.61–1.24)
Chloride: 103 mmol/L (ref 98–111)
GFR calc Af Amer: 5 mL/min — ABNORMAL LOW (ref >60)
GFR calc non Af Amer: 5 mL/min — ABNORMAL LOW (ref >60)
GLUCOSE: 122 mg/dL — AB (ref 70–99)
Potassium: 3.4 mmol/L — ABNORMAL LOW (ref 3.5–5.1)
Sodium: 140 mmol/L (ref 135–145)

## 2018-06-21 LAB — ETHANOL: Alcohol, Ethyl (B): <10 mg/dL (ref <10)

## 2018-06-21 LAB — CBC
HCT: 46.3 % (ref 39.0–52.0)
Hemoglobin: 15.9 g/dL (ref 13.0–17.0)
MCH: 30 pg (ref 26.0–34.0)
MCHC: 34.3 g/dL (ref 30.0–36.0)
MCV: 87.4 fL (ref 78.0–100.0)
Platelets: 173 10*3/uL (ref 150–400)
RBC: 5.3 MIL/uL (ref 4.22–5.81)
RDW: 13 % (ref 11.5–15.5)
WBC: 6.8 10*3/uL (ref 4.0–10.5)

## 2018-06-21 LAB — ETHYLENE GLYCOL: Ethylene Glycol Lvl: NOT DETECTED mg/dL

## 2018-06-21 LAB — PROTIME-INR
INR: 1.03
Prothrombin Time: 13.4 seconds (ref 11.4–15.2)

## 2018-06-21 LAB — MAGNESIUM: Magnesium: 2.1 mg/dL (ref 1.7–2.4)

## 2018-06-21 MED ORDER — LABETALOL HCL 5 MG/ML IV SOLN
10.0000 mg | INTRAVENOUS | Status: DC | PRN
  Filled 2018-06-21: qty 4

## 2018-06-21 MED ORDER — ZOLPIDEM TARTRATE 5 MG PO TABS: 5.0000 mg | ORAL_TABLET | Freq: Every evening | ORAL | Status: DC | PRN

## 2018-06-21 MED ORDER — POLYETHYLENE GLYCOL 3350 17 G PO PACK: 17.0000 g | PACK | Freq: Every day | ORAL | Status: DC | PRN

## 2018-06-21 MED ORDER — ACETAMINOPHEN 325 MG PO TABS
650.0000 mg | ORAL_TABLET | Freq: Four times a day (QID) | ORAL | Status: DC | PRN
  Administered 2018-06-21 – 2018-06-30 (×12): 650 mg via ORAL
  Filled 2018-06-21 (×7): qty 2

## 2018-06-21 MED ORDER — SODIUM CHLORIDE 0.9% FLUSH: 3.0000 mL | INTRAVENOUS | Status: DC | PRN

## 2018-06-21 MED ORDER — SODIUM CHLORIDE 0.9 % IV SOLN
1.0000 g | INTRAVENOUS | Status: DC
  Administered 2018-06-21: 1 g via INTRAVENOUS
  Filled 2018-06-21 (×2): qty 10

## 2018-06-21 MED ORDER — HYDRALAZINE HCL 20 MG/ML IJ SOLN: 10.0000 mg | INTRAMUSCULAR | Status: DC | PRN

## 2018-06-21 MED ORDER — ONDANSETRON HCL 4 MG/2ML IJ SOLN
4.0000 mg | Freq: Four times a day (QID) | INTRAMUSCULAR | Status: DC | PRN
  Administered 2018-06-21: 4 mg via INTRAVENOUS
  Filled 2018-06-21: qty 2

## 2018-06-21 MED ORDER — HYDRALAZINE HCL 20 MG/ML IJ SOLN
5.0000 mg | INTRAMUSCULAR | Status: DC | PRN
  Administered 2018-06-21 (×2): 5 mg via INTRAVENOUS
  Filled 2018-06-21 (×2): qty 1

## 2018-06-21 MED ORDER — ACETAMINOPHEN 325 MG PO TABS
650.0000 mg | ORAL_TABLET | Freq: Four times a day (QID) | ORAL | Status: DC | PRN
  Filled 2018-06-21 (×5): qty 2

## 2018-06-21 MED ORDER — METOPROLOL SUCCINATE ER 100 MG PO TB24
100.0000 mg | ORAL_TABLET | Freq: Every day | ORAL | Status: DC
  Administered 2018-06-21 – 2018-06-30 (×10): 100 mg via ORAL
  Filled 2018-06-21 (×10): qty 1

## 2018-06-21 MED ORDER — SODIUM CHLORIDE 0.9 % IV SOLN
INTRAVENOUS | Status: DC
  Administered 2018-06-21 – 2018-06-22 (×2): via INTRAVENOUS

## 2018-06-21 MED ORDER — POTASSIUM CHLORIDE 20 MEQ/15ML (10%) PO SOLN
40.0000 meq | Freq: Once | ORAL | Status: AC
  Administered 2018-06-21: 40 meq via ORAL
  Filled 2018-06-21: qty 30

## 2018-06-21 MED ORDER — GI COCKTAIL ~~LOC~~
30.0000 mL | Freq: Once | ORAL | Status: AC
  Administered 2018-06-21: 30 mL via ORAL
  Filled 2018-06-21: qty 30

## 2018-06-21 MED ORDER — TRAZODONE HCL 50 MG PO TABS
50.0000 mg | ORAL_TABLET | Freq: Every evening | ORAL | Status: DC | PRN
Start: 2018-06-21 — End: 2018-06-30

## 2018-06-21 MED ORDER — SODIUM CHLORIDE 0.9 % IV BOLUS
1000.0000 mL | Freq: Once | INTRAVENOUS | Status: AC
  Administered 2018-06-21: 1000 mL via INTRAVENOUS

## 2018-06-21 MED ORDER — CEFTRIAXONE SODIUM 1 G IJ SOLR
1.0000 g | Freq: Once | INTRAMUSCULAR | Status: AC
  Administered 2018-06-21: 1 g via INTRAVENOUS
  Filled 2018-06-21: qty 10

## 2018-06-21 MED ORDER — ONDANSETRON HCL 4 MG PO TABS: 4.0000 mg | ORAL_TABLET | Freq: Four times a day (QID) | ORAL | Status: DC | PRN

## 2018-06-21 MED ORDER — HYDRALAZINE HCL 50 MG PO TABS
50.0000 mg | ORAL_TABLET | Freq: Three times a day (TID) | ORAL | Status: DC
  Administered 2018-06-21 – 2018-06-30 (×25): 50 mg via ORAL
  Filled 2018-06-21 (×27): qty 1

## 2018-06-21 MED ORDER — SODIUM CHLORIDE 0.9% FLUSH
3.0000 mL | Freq: Two times a day (BID) | INTRAVENOUS | Status: DC
  Administered 2018-06-22 – 2018-06-29 (×12): 3 mL via INTRAVENOUS

## 2018-06-21 MED ORDER — SODIUM CHLORIDE 0.9 % IV SOLN
1.0000 g | INTRAVENOUS | Status: DC
  Administered 2018-06-22: 1 g via INTRAVENOUS
  Filled 2018-06-21: qty 10

## 2018-06-21 MED ORDER — ONDANSETRON HCL 4 MG/2ML IJ SOLN
4.0000 mg | Freq: Three times a day (TID) | INTRAMUSCULAR | Status: DC | PRN
  Administered 2018-06-21 – 2018-06-22 (×2): 4 mg via INTRAVENOUS
  Filled 2018-06-21 (×3): qty 2

## 2018-06-21 MED ORDER — ISOSORBIDE MONONITRATE ER 30 MG PO TB24
30.0000 mg | ORAL_TABLET | Freq: Every day | ORAL | Status: DC
  Administered 2018-06-21 – 2018-06-25 (×5): 30 mg via ORAL
  Filled 2018-06-21 (×5): qty 1

## 2018-06-21 MED ORDER — SODIUM CHLORIDE 0.9 % IV SOLN
INTRAVENOUS | Status: DC
  Administered 2018-06-21: 06:00:00 via INTRAVENOUS

## 2018-06-21 MED ORDER — AMLODIPINE BESYLATE 10 MG PO TABS
10.0000 mg | ORAL_TABLET | Freq: Every day | ORAL | Status: DC
  Administered 2018-06-21 – 2018-06-30 (×10): 10 mg via ORAL
  Filled 2018-06-21 (×10): qty 1

## 2018-06-21 MED ORDER — SODIUM CHLORIDE 0.9 % IV SOLN: 250.0000 mL | INTRAVENOUS | Status: DC | PRN

## 2018-06-21 MED ORDER — FENTANYL CITRATE (PF) 100 MCG/2ML IJ SOLN
50.0000 ug | Freq: Once | INTRAMUSCULAR | Status: AC
  Administered 2018-06-21: 50 ug via INTRAVENOUS
  Filled 2018-06-21: qty 2

## 2018-06-21 MED ORDER — HEPARIN SODIUM (PORCINE) 5000 UNIT/ML IJ SOLN
5000.0000 [IU] | Freq: Three times a day (TID) | INTRAMUSCULAR | Status: DC
  Administered 2018-06-21 – 2018-06-23 (×5): 5000 [IU] via SUBCUTANEOUS
  Filled 2018-06-21 (×5): qty 1

## 2018-06-21 MED ORDER — ACETAMINOPHEN 650 MG RE SUPP: 650.0000 mg | Freq: Four times a day (QID) | RECTAL | Status: DC | PRN

## 2018-06-21 MED ORDER — ALBUTEROL SULFATE (2.5 MG/3ML) 0.083% IN NEBU: 2.5000 mg | INHALATION_SOLUTION | RESPIRATORY_TRACT | Status: DC | PRN

## 2018-06-21 NOTE — ED Notes (Addendum)
MD paged for diet orders and meds for bloating.

## 2018-06-21 NOTE — H&P (Addendum)
Patient Demographics:    Bryan Riggs, is a 52 y.o. 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  CT renal protocol without obstructive uropathy,, hydrate, if renal function fails to improve as anticipated please recall Dr. Lowell Guitar from nephrology service for an official nephrology consult.  Monitor fluid input and output closely, check daily weights, patient is voiding well at this time.  Lisinopril HCTZ has been discontinued  2)Depressive Symptoms--patient apparently took excessive amount of Tylenol and Aleve as well as OTC sleep aid, patient apparently also drank antifreeze about 2 weeks ago due to being depressed, patient and significant other at this time states the patient is feeling better now.  patient denies suicidal ideation or plan patient denies homicidal ideation or plan.  Patient states that this is a better place emotionally now.  Patient refuses and declines psychiatric input at this time. I have requested official psychiatric evaluation.   Apparently no prior history of suicidal intent to attempt  3)HTN- stage 2--BP is not at goal, hold lisinopril HCTZ due to AKI as noted above #1, give amlodipine 10 mg daily, metoprolol as ordered, okay to hold hydralazine/isosorbide combo,   may use IV labetalol when necessary  Every 4 hours for systolic blood pressure over 160 mmhg  4) possible UTI--- UA suggestive of UTI, continue IV Rocephin pending cultures  5) intentional ingestion of toxic agents----apparently patient took excessive amounts of Tylenol, Aleve and then drank antifreeze couple weeks ago, salicylate levels Tylenol level not elevated, LFTs not elevated, ethanol and volatile substance level not elevated, INR is not elevated  With History of - Reviewed by me  Past Medical History:  Diagnosis Date  . Aberrant subclavian artery    RIGHT...PER CT CHEST...07/20/13  . 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 Chest pain CT ANGIO CHEST 07/20/13   LED TO DISCOVERY OF SINUS OF VALSALVA  ANEURYSM  . Erectile dysfunction    DR. NESI  . Hypertension   . Kidney stone       Past Surgical History:  Procedure Laterality Date  . NO PAST  SURGERIES     CC-- flank pain    HPI:    Bryan Riggs  is a 52 y.o. male 52 year old male with past medical history for CKD-2, apparent subclavian artery, aneurysm of aortic sinus of Valsalva, who presents with multiple complaints, including right flank pain, blurry vision, dizziness, abdominal pain, nausea, vomiting, loose stool bowel movement for several days.  Emesis was without bile or blood, loose stools were without blood or mucus, no fevers.  Patient's significant other at bedside, questions answered, patient admits to intentional ingestion of anti-freeze and  large amounts of NSAIDs about 2 weeks ago,  In ED Patient was found to have worsening renal function, with creatinine increased from baseline of 1.55 on 12/01/2015 to 9.20, BUN 51.   Tylenol level less than 10, salicylate level less than 7, alcohol less than 10, CT per renal stone protocol showed no hydronephrosis.  Urinalysis is positive for UTI, started Rocephin in ED.  ED physician consulted renal, Dr. Lowell Guitar who recommended hydration and rend creatinine and no urgent dialysis needed now. will need to call renal again for formal consultation if needed. EDP also talked to poison control-->recommended to consult renal.    Review of systems:    In addition to the HPI above,   A full Review of  Systems was done, all other systems reviewed are negative except as noted above in HPI , .    Social History:  Reviewed by me    Social History   Tobacco Use  . Smoking status: Never Smoker  . Smokeless tobacco: Never Used  Substance Use Topics  . Alcohol use: Yes    Comment: RARE     Family History :  Reviewed by me    Family History  Problem Relation Age of Onset  . Cancer Mother        BREAST CANCER  . Cancer Father        MUTIPLE MYELOMA  . Hypertension Father   . Cancer Brother        ? TYPE     Home Medications:   Prior to Admission medications   Medication Sig Start Date End Date Taking? Authorizing Provider  amLODipine (NORVASC) 10 MG tablet Take 1 tablet (10 mg total) by mouth daily. 03/14/18  Yes Lyn Records, MD  lisinopril-hydrochlorothiazide (PRINZIDE,ZESTORETIC) 20-25 MG tablet Take 1 tablet by mouth daily. 06/23/17  Yes Robbie Lis M, PA-C  metoprolol succinate (TOPROL-XL) 100 MG 24 hr tablet TAKE 1/2 TABLET(50 MG) BY MOUTH DAILY 06/23/17  Yes Robbie Lis M, PA-C  sildenafil (VIAGRA) 100 MG tablet TAKE 1 TABLET(100 MG) BY MOUTH DAILY AS NEEDED FOR ERECTILE DYSFUNCTION 06/23/17  Yes Robbie Lis M, PA-C     Allergies:     Allergies  Allergen Reactions  . Sulfasalazine Nausea And Vomiting  . Sulfa Antibiotics Rash and Nausea And Vomiting    Break out, itching  . Viagra [Sildenafil Citrate] Other (See Comments)     FLUSHING      Physical Exam:   Vitals  Blood pressure (!) 148/93, pulse 85, temperature 99.4 F (37.4 C), temperature source Oral, resp. rate (!) 24, height 6' (1.829 m), weight 111.1 kg (245 lb), SpO2 98 %.  Physical Examination: General appearance - alert, well appearing, and in no distress and  Mental status - alert, oriented to person, place, and time,  Eyes - sclera anicteric Neck - supple, no JVD elevation , Chest - clear  to auscultation bilaterally, symmetrical air movement,  Heart -  S1 and S2 normal,  Abdomen - soft, nontender, nondistended, no masses or organomegaly, no CVA area tenderness Neurological - screening mental status exam normal, neck supple without rigidity, cranial nerves II through XII intact, DTR's normal and symmetric Extremities - no pedal edema noted, intact peripheral pulses  Skin - warm, dry     Data Review:    CBC Recent Labs  Lab 06/20/18 1750  WBC 7.8  HGB 17.2*  HCT 51.1  PLT 198  MCV 89.2  MCH 30.0  MCHC 33.7  RDW 13.1  LYMPHSABS 1.3  MONOABS 1.0  EOSABS 0.1  BASOSABS 0.0   ------------------------------------------------------------------------------------------------------------------  Chemistries  Recent Labs  Lab 06/20/18 1750 06/21/18 0335  NA 141  --   K 3.0*  --   CL 103  --   CO2 21*  --   GLUCOSE 95  --   BUN 51*  --   CREATININE 9.20*  --   CALCIUM 8.6*  --   MG  --  2.1  AST 24  --   ALT 27  --   ALKPHOS 76  --   BILITOT 1.4*  --    ------------------------------------------------------------------------------------------------------------------ estimated creatinine clearance is 12 mL/min (A) (by C-G formula based on SCr of 9.2 mg/dL (H)). ------------------------------------------------------------------------------------------------------------------ No results for input(s): TSH, T4TOTAL, T3FREE, THYROIDAB in the last 72 hours.  Invalid input(s): FREET3   Coagulation profile Recent Labs  Lab  06/21/18 0200  INR 1.03   ------------------------------------------------------------------------------------------------------------------- No results for input(s): DDIMER in the last 72 hours. -------------------------------------------------------------------------------------------------------------------  Cardiac Enzymes No results for input(s): CKMB, TROPONINI, MYOGLOBIN in the last 168 hours.  Invalid input(s): CK ------------------------------------------------------------------------------------------------------------------ No results found for: BNP   ---------------------------------------------------------------------------------------------------------------  Urinalysis    Component Value Date/Time   COLORURINE YELLOW 06/20/2018 2156   APPEARANCEUR CLOUDY (A) 06/20/2018 2156   LABSPEC 1.009 06/20/2018 2156   PHURINE 5.0 06/20/2018 2156   GLUCOSEU NEGATIVE 06/20/2018 2156   HGBUR MODERATE (A) 06/20/2018 2156   BILIRUBINUR NEGATIVE 06/20/2018 2156   KETONESUR NEGATIVE 06/20/2018 2156   PROTEINUR 100 (A) 06/20/2018 2156   UROBILINOGEN 0.2 10/02/2012 1307   NITRITE NEGATIVE 06/20/2018 2156   LEUKOCYTESUR LARGE (A) 06/20/2018 2156    ----------------------------------------------------------------------------------------------------------------   Imaging Results:    Ct Renal Stone Study  Result Date: 06/20/2018 CLINICAL DATA:  Acute right flank pain. EXAM: CT ABDOMEN AND PELVIS WITHOUT CONTRAST TECHNIQUE: Multidetector CT imaging of the abdomen and pelvis was performed following the standard protocol without IV contrast. COMPARISON:  CT scan of October 02, 2012. FINDINGS: Lower chest: No acute abnormality. Hepatobiliary: No focal liver abnormality is seen. No gallstones, gallbladder wall thickening, or biliary dilatation. Pancreas: Unremarkable. No pancreatic ductal dilatation or surrounding inflammatory changes. Spleen: Normal in size without focal abnormality.  Adrenals/Urinary Tract: Adrenal glands are unremarkable. Kidneys are normal, without renal calculi, focal lesion, or hydronephrosis. Bladder is unremarkable. Stomach/Bowel: Stomach is within normal limits. Appendix appears normal. No evidence of bowel wall thickening, distention, or inflammatory changes. Vascular/Lymphatic: No significant vascular findings are present. No enlarged abdominal or pelvic lymph nodes. Reproductive: Prostate is unremarkable. Other: No abdominal wall hernia or abnormality. No abdominopelvic ascites. Musculoskeletal: No acute or significant osseous findings. IMPRESSION: No definite abnormality seen in the abdomen or pelvis. Electronically Signed   By: Lupita Raider, M.D.   On: 06/20/2018 19:19    Radiological Exams on Admission: Ct Renal Stone Study  Result Date: 06/20/2018 CLINICAL DATA:  Acute right flank pain. EXAM: CT ABDOMEN AND PELVIS WITHOUT CONTRAST  TECHNIQUE: Multidetector CT imaging of the abdomen and pelvis was performed following the standard protocol without IV contrast. COMPARISON:  CT scan of October 02, 2012. FINDINGS: Lower chest: No acute abnormality. Hepatobiliary: No focal liver abnormality is seen. No gallstones, gallbladder wall thickening, or biliary dilatation. Pancreas: Unremarkable. No pancreatic ductal dilatation or surrounding inflammatory changes. Spleen: Normal in size without focal abnormality. Adrenals/Urinary Tract: Adrenal glands are unremarkable. Kidneys are normal, without renal calculi, focal lesion, or hydronephrosis. Bladder is unremarkable. Stomach/Bowel: Stomach is within normal limits. Appendix appears normal. No evidence of bowel wall thickening, distention, or inflammatory changes. Vascular/Lymphatic: No significant vascular findings are present. No enlarged abdominal or pelvic lymph nodes. Reproductive: Prostate is unremarkable. Other: No abdominal wall hernia or abnormality. No abdominopelvic ascites. Musculoskeletal: No acute or  significant osseous findings. IMPRESSION: No definite abnormality seen in the abdomen or pelvis. Electronically Signed   By: Lupita RaiderJames  Green Jr, M.D.   On: 06/20/2018 19:19    DVT Prophylaxis -SCD  /heparin AM Labs Ordered, also please review Full Orders  Family Communication: Admission, patients condition and plan of care including tests being ordered have been discussed with the patient and s/o who indicate understanding and agree with the plan   Code Status - Full Code  Likely DC to  home  Condition   stable  Shon Haleourage Ronnisha Felber M.D on 06/21/2018 at 10:34 AM  Go to [email or web address] - password [email or web address] for contact info  [facility] - Office  [phone]

## 2018-06-21 NOTE — ED Notes (Signed)
Patient is requesting to be made confidential ("xxx"), registration made aware

## 2018-06-21 NOTE — ED Notes (Addendum)
Patient's lunch tray delivered, provided water

## 2018-06-21 NOTE — ED Provider Notes (Addendum)
[facility] EMERGENCY DEPARTMENT Provider Note   CSN: [id number] Arrival date & time: 06/20/18  1732     History   Chief Complaint No chief complaint on file.   HPI Bryan Riggs is a 52 y.o. male.  HPI 53 year old African-American male past medical history significant for hypertension presents to the ED for evaluation of right-sided flank pain with occasional radiation to the testicles. Also notes a few episodes of watery nonbloody loose stools.  He states that 3 weeks ago he was "in a bad place "and took a large amount of sleep aid, Tylenol, Aleve, and drink antifreeze.  He denies suicidal ideation at this time.  Denies urinary frequency, urgency, dysuria, hematuria, melena, hematochezia, chest pain, or shortness of breath.  He does endorse intermittent lightheadedness but denies syncope.  Denies saddle anesthesia, bowel or bladder incontinence, IV drug use, or fevers.  Did not take his blood pressure medicines today.  Patient currently denies SI or HI.  Pt denies any fever, chill, ha, vision changes,congestion, neck pain, cp, sob, cough, abd pain,urinary symptoms, change in bowel habits, melena, hematochezia, lower extremity paresthesias.   Past Medical History:  Diagnosis Date  . Aberrant subclavian artery    RIGHT...PER CT CHEST...07/20/13  . 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 Chest pain CT ANGIO CHEST 07/20/13   LED TO DISCOVERY OF SINUS OF VALSALVA  ANEURYSM  . Erectile dysfunction    DR. NESI  . Hypertension   . Kidney stone     Patient Active Problem List   Diagnosis Date Noted  . Acute renal failure superimposed on stage 3 chronic kidney disease (HCC) 06/21/2018  . Erectile dysfunction   . Hypertension   . Kidney stone   . Chest pain   . 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 Aberrant subclavian artery     Past Surgical History:  Procedure Laterality Date  . NO PAST  SURGERIES          Home Medications    Prior to Admission medications   Medication Sig Start Date End Date Taking? Authorizing Provider  amLODipine (NORVASC) 10 MG tablet Take 1 tablet (10 mg total) by mouth daily. 03/14/18  Yes Lyn Records, MD  lisinopril-hydrochlorothiazide (PRINZIDE,ZESTORETIC) 20-25 MG tablet Take 1 tablet by mouth daily. 06/23/17  Yes Robbie Lis M, PA-C  metoprolol succinate (TOPROL-XL) 100 MG 24 hr tablet TAKE 1/2 TABLET(50 MG) BY MOUTH DAILY 06/23/17  Yes Robbie Lis M, PA-C  sildenafil (VIAGRA) 100 MG tablet TAKE 1 TABLET(100 MG) BY MOUTH DAILY AS NEEDED FOR ERECTILE DYSFUNCTION 06/23/17  Yes Allayne Butcher, PA-C    Family History Family History  Problem Relation Age of Onset  . Cancer Mother        BREAST CANCER  . Cancer Father        MUTIPLE MYELOMA  . Hypertension Father   . Cancer Brother        ? TYPE    Social History Social History   Tobacco Use  . Smoking status: Never Smoker  . Smokeless tobacco: Never Used  Substance Use Topics  . Alcohol use: Yes    Comment: RARE  . Drug use: No     Allergies   Sulfasalazine; Sulfa antibiotics; and Viagra [sildenafil citrate]   Review of Systems Review of Systems  All other systems reviewed and are negative.    Physical Exam Updated Vital  Signs BP (!) 189/118   Pulse 79   Temp 99.4 F (37.4 C) (Oral)   Resp (!) 22   Ht 6' (1.829 m)   Wt 111.1 kg (245 lb)   SpO2 96%   BMI 33.23 kg/m   Physical Exam  Constitutional: He is oriented to person, place, and time. He appears well-developed and well-nourished.  Non-toxic appearance. No distress.  HENT:  Head: Normocephalic and atraumatic.  Mouth/Throat: Oropharynx is clear and moist.  Eyes: Pupils are equal, round, and reactive to light. Conjunctivae are normal. Right eye exhibits no discharge. Left eye exhibits no discharge.  Neck: Normal range of motion. Neck supple.  Cardiovascular: Normal rate, regular rhythm,  normal heart sounds and intact distal pulses. Exam reveals no gallop and no friction rub.  No murmur heard. Pulmonary/Chest: Effort normal and breath sounds normal. No stridor. No respiratory distress. He has no wheezes. He has no rales. He exhibits no tenderness.  Abdominal: Soft. Bowel sounds are normal. He exhibits no distension. There is no tenderness. There is no rebound and no guarding.  Musculoskeletal: Normal range of motion. He exhibits no tenderness.  Lymphadenopathy:    He has no cervical adenopathy.  Neurological: He is alert and oriented to person, place, and time.  Skin: Skin is warm and dry. Capillary refill takes less than 2 seconds. No rash noted.  Psychiatric: His behavior is normal. Judgment and thought content normal.  Nursing note and vitals reviewed.    ED Treatments / Results  Labs (all labs ordered are listed, but only abnormal results are displayed) Labs Reviewed  CBC WITH DIFFERENTIAL/PLATELET - Abnormal; Notable for the following components:      Result Value   Hemoglobin 17.2 (*)    All other components within normal limits  COMPREHENSIVE METABOLIC PANEL - Abnormal; Notable for the following components:   Potassium 3.0 (*)    CO2 21 (*)    BUN 51 (*)    Creatinine, Ser 9.20 (*)    Calcium 8.6 (*)    Total Bilirubin 1.4 (*)    GFR calc non Af Amer 6 (*)    GFR calc Af Amer 7 (*)    Anion gap 17 (*)    All other components within normal limits  URINALYSIS, ROUTINE W REFLEX MICROSCOPIC - Abnormal; Notable for the following components:   APPearance CLOUDY (*)    Hgb urine dipstick MODERATE (*)    Protein, ur 100 (*)    Leukocytes, UA LARGE (*)    WBC, UA >50 (*)    Bacteria, UA RARE (*)    All other components within normal limits  ACETAMINOPHEN LEVEL - Abnormal; Notable for the following components:   Acetaminophen (Tylenol), Serum <10 (*)    All other components within normal limits  LIPASE, BLOOD  SALICYLATE LEVEL  ETHANOL  PROTIME-INR  CK    MAGNESIUM  VOLATILES,BLD-ACETONE,ETHANOL,ISOPROP,METHANOL  ETHYLENE GLYCOL    EKG EKG Interpretation  Date/Time:  Wednesday June 21 2018 00:33:13 EDT Ventricular Rate:  89 PR Interval:    QRS Duration: 86 QT Interval:  368 QTC Calculation: 448 R Axis:   -20 Text Interpretation:  Sinus rhythm Atrial premature complex Prolonged PR interval Consider left atrial enlargement Borderline left axis deviation Probable anteroseptal infarct, old ST elevation, consider inferior injury Confirmed by Ross MarcusHorton, Courtney ([id number]) on 06/21/2018 12:38:03 AM   Radiology Ct Renal Stone Study  Result Date: 06/20/2018 CLINICAL DATA:  Acute right flank pain. EXAM: CT ABDOMEN AND PELVIS WITHOUT CONTRAST TECHNIQUE: Multidetector  CT imaging of the abdomen and pelvis was performed following the standard protocol without IV contrast. COMPARISON:  CT scan of October 02, 2012. FINDINGS: Lower chest: No acute abnormality. Hepatobiliary: No focal liver abnormality is seen. No gallstones, gallbladder wall thickening, or biliary dilatation. Pancreas: Unremarkable. No pancreatic ductal dilatation or surrounding inflammatory changes. Spleen: Normal in size without focal abnormality. Adrenals/Urinary Tract: Adrenal glands are unremarkable. Kidneys are normal, without renal calculi, focal lesion, or hydronephrosis. Bladder is unremarkable. Stomach/Bowel: Stomach is within normal limits. Appendix appears normal. No evidence of bowel wall thickening, distention, or inflammatory changes. Vascular/Lymphatic: No significant vascular findings are present. No enlarged abdominal or pelvic lymph nodes. Reproductive: Prostate is unremarkable. Other: No abdominal wall hernia or abnormality. No abdominopelvic ascites. Musculoskeletal: No acute or significant osseous findings. IMPRESSION: No definite abnormality seen in the abdomen or pelvis. Electronically Signed   By: Lupita Raider, M.D.   On: 06/20/2018 19:19    Procedures .Critical  Care Performed by: Rise Mu, PA-C Authorized by: Rise Mu, PA-C   Critical care provider statement:    Critical care time (minutes):  60   Critical care was necessary to treat or prevent imminent or life-threatening deterioration of the following conditions: AKI after ingesting ethylene glycol possible need for hemodialysis.   Critical care was time spent personally by me on the following activities:  Development of treatment plan with patient or surrogate, discussions with primary provider, evaluation of patient's response to treatment, discussions with consultants, examination of patient, obtaining history from patient or surrogate, ordering and performing treatments and interventions, ordering and review of laboratory studies, ordering and review of radiographic studies, pulse oximetry, re-evaluation of patient's condition and review of old charts   (including critical care time)  Medications Ordered in ED Medications  0.9 %  sodium chloride infusion ( Intravenous New Bag/Given 06/21/18 0624)  hydrALAZINE (APRESOLINE) injection 5 mg (5 mg Intravenous Given 06/21/18 0620)  ondansetron (ZOFRAN) injection 4 mg (has no administration in time range)  acetaminophen (TYLENOL) tablet 650 mg (has no administration in time range)  zolpidem (AMBIEN) tablet 5 mg (has no administration in time range)  ondansetron (ZOFRAN-ODT) disintegrating tablet 4 mg (4 mg Oral Given 06/20/18 1821)  sodium chloride 0.9 % bolus 1,000 mL (1,000 mLs Intravenous New Bag/Given 06/21/18 0322)  cefTRIAXone (ROCEPHIN) 1 g in sodium chloride 0.9 % 100 mL IVPB (0 g Intravenous Stopped 06/21/18 0309)  sodium chloride 0.9 % bolus 1,000 mL (0 mLs Intravenous Stopped 06/21/18 0309)  fentaNYL (SUBLIMAZE) injection 50 mcg (50 mcg Intravenous Given 06/21/18 0321)  potassium chloride 20 MEQ/15ML (10%) solution 40 mEq (40 mEq Oral Given 06/21/18 0333)     Initial Impression / Assessment and Plan / ED Course  I have reviewed  the triage vital signs and the nursing notes.  Pertinent labs & imaging results that were available during my care of the patient were reviewed by me and considered in my medical decision making (see chart for details).     Patient resents to the emergency department today for evaluation of low back pain and nausea.  Patient does report 2 weeks ago he was in a bad place and drank a small amount of antifreeze, took extra Tylenol, sleep aids and Aleve.  Patient continues to make urine.  Patient is hypertensive with history of same.  Otherwise vital signs reassuring.  On exam patient has no focal abdominal tenderness.  Heart regular rate and rhythm.  Lungs clear to auscultation bilaterally.  Neurovascular  intact in all extremities.  Labs notable for a creatinine of 9.2 with 1.  Hypokalemia 3.0.  Patient has a anion gap of 17.  Bicarb is 21.  No leukocytosis.  Normal Tylenol, salicylate level.  Normal CK and ethanol.  UA shows moderate hemoglobin, WBCs, white blood cell clumps, bacteria and red cells.  Concern for acute kidney injury given patient's history of drinking antifreeze and taking Tylenol, salicylate, alleve.  I spoke with Dr. Lowell Guitar with nephrology.  He is recommended that patient be hydrated and kidney function trended.  They can be consulted in the inpatient setting if needed but no need for immediate dialysis at this time.  I did speak with poison control who has no further recommendations.  They feel the patient has had a significant time to metabolize.  He managed in the ED.  Will admit to hospital medicine for further management.  Spoke with Dr. Zane Herald with hospital medicine who agrees to admission will see patient in the ED and patient mission orders.  Treat with Rocephin however I feel that patient's urine seems consistent with possible kidney injury versus UTI.  Patient denies SI or HI at this time however feel the patient would benefit from psychiatry evaluation.  Seen by my  attending who is agreeable the above plan.  Final Clinical Impressions(s) / ED Diagnoses   Final diagnoses:  AKI (acute kidney injury) [facility]    ED Discharge Orders    None       Rise Mu, PA-C 06/21/18 0722    Rise Mu, PA-C 06/21/18 1191    Shon Baton, MD 06/21/18 [phone]

## 2018-06-21 NOTE — Care Management (Signed)
This is a no charge note  Pending admission per PA, Joselyn Glassmanyler  52 year old male with past medical history for CKD-3, apparent subclavian artery, aneurysm of aortic sinus of Valsalva, who presents with multiple complaints, including right flank pain, blurry vision, dizziness, abdominal pain, nausea, vomiting, loose stool bowel movement for several days.  Patient drank antifreeze or 2 weeks ago per ED physician. He also took large amounts of sleep aide, tylenol, aleve.   Patient was found to have worsening renal function, with creatinine increased from baseline of 1.55 on 12/01/2015 to 9.20, BUN 51.  Tylenol level less than 10, salicylate level less than 7, alcohol less than 10, CT per renal stone protocol showed no hydronephrosis.  Urinalysis is positive for UTI, started Rocephin.  ED physician consulted renal, Dr. Lowell GuitarPowell who recommended hydration and rend creatinine and no urgent dialysis needed now. will need to call renal again for formal consultation if needed. Pending CK, Ethylene glycol level, volatiles. Pt is place in tele bed for obs. 2L NS, then 125 cc/h. Strict in/out, Foley cath placement. EDP also talk to poison control-->recommended to consult renal.  Lorretta HarpXilin Ithiel Liebler, MD  [facility] Pager [phone]  If 7PM-7AM, please contact night-coverage [email or web address] Password [email or web address] 06/21/2018, 3:28 AM

## 2018-06-21 NOTE — Consult Note (Addendum)
[place]iatry Consult   Reason for Consult:  Suicide attempt  Referring Physician:  Dr. Denton Brick Patient Identification: Bryan Riggs MRN:  [id number] Principal Diagnosis: Depression Diagnosis:   Patient Active Problem List   Diagnosis Date Noted  . Acute renal failure superimposed on stage 3 chronic kidney disease ([place]) [N17.9, N18.3] 06/21/2018  . AKI (acute kidney injury) ([place]) [N17.9] 06/21/2018  . Erectile dysfunction [N52.9]   . Hypertension [I10]   . Kidney stone [N20.0]   . Chest pain [R07.9]   . 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 male patient admitted with AKI due to suicide attempt by drug overdose with Tylenol, Aleve, OTC sleep aid and antifreeze ingestion 2 weeks ago.  HPI:   Per chart review, patient was admitted with AIK due to suicide attempt by drug overdose with Tylenol, Aleve, OTC sleep aid and antifreeze ingestion 2 weeks ago in the setting of depressed mood. He reports feeling better now. He initially presented to the ED for multiple complaints including right flank pain, blurry vision, dizziness, abdominal pain, nausea, vomiting and loose stool for several days. Creatinine is 10.92.   On interview, Bryan Riggs reports that his suicide attempt 2 weeks ago was impulsive.  He reports that he was having one of his chronic headaches which resulted in poor sleep.  He reports taking a couple of Tylenol but this did not resolve his pain as usual.  He reports taking a couple more until he finished the bottle.  He reports that there were only a few pills left in the bottle.  He reports feeling "fed up" and ended up taking the other medications as mentioned above as well as ingesting a small amount of antifreeze.  He reports that he wanted to "just go to sleep."  He felt immediate regret after doing  this.  He reports that he knows that it is not his time and feels guilty because he is religious and knows that suicide is a sin.  He reports informing his close friends about his suicide attempt.  They are more like family to him.  He reports a strong support system.  He feels as though he can talk to his friends moving forward.  He reports that he used to talk to his father about personal issues until his father passed away a few years ago.  After his father died his family grew apart.  He does not see his siblings unless there is an event that brings them together.  He reports that he misses the closeness of family.  He plans to start therapy through his job.  He also plans to take some time off of work.  He endorses poor sleep and appetite over the past week due to feeling ill.  He denies problems with sleep or appetite prior to this.  Past Psychiatric History: Denies   Risk to Self:  None. Denies SI.  Risk to Others:  None. Denies HI. Prior Inpatient Therapy:  Denies  Prior Outpatient Therapy:  Denies   Past Medical History:  Past Medical History:  Diagnosis Date  . Aberrant subclavian artery    RIGHT...PER CT CHEST...07/20/13  . 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 Chest pain CT ANGIO CHEST 07/20/13   LED TO  DISCOVERY OF SINUS OF VALSALVA  ANEURYSM  . Erectile dysfunction    DR. NESI  . Hypertension   . Kidney stone     Past Surgical History:  Procedure Laterality Date  . NO PAST SURGERIES     Family History:  Family History  Problem Relation Age of Onset  . Cancer Mother        BREAST CANCER  . Cancer Father        MUTIPLE MYELOMA  . Hypertension Father   . Cancer Brother        ? TYPE   Family Psychiatric  History: Denies  Social History:  Social History   Substance and Sexual Activity  Alcohol Use Yes   Comment: RARE     Social History   Substance and Sexual Activity  Drug Use No    Social History    Socioeconomic History  . Marital status: Married    Spouse name: Not on file  . Number of children: Not on file  . Years of education: Not on file  . Highest education level: Not on file  Occupational History  . Not on file  Social Needs  . Financial resource strain: Not on file  . Food insecurity:    Worry: Not on file    Inability: Not on file  . Transportation needs:    Medical: Not on file    Non-medical: Not on file  Tobacco Use  . Smoking status: Never Smoker  . Smokeless tobacco: Never Used  Substance and Sexual Activity  . Alcohol use: Yes    Comment: RARE  . Drug use: No  . Sexual activity: Not on file  Lifestyle  . Physical activity:    Days per week: Not on file    Minutes per session: Not on file  . Stress: Not on file  Relationships  . Social connections:    Talks on phone: Not on file    Gets together: Not on file    Attends religious service: Not on file    Active member of club or organization: Not on file    Attends meetings of clubs or organizations: Not on file    Relationship status: Not on file  Other Topics Concern  . Not on file  Social History Narrative  . Not on file   Additional Social History: He lives at home alone. He has a 47 y/o son. He is divorced. 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 He reports social alcohol use and denies illicit substance use.      Allergies:   Allergies  Allergen Reactions  . Sulfasalazine Nausea And Vomiting  . Sulfa Antibiotics Rash and Nausea And Vomiting    Break out, itching  . Viagra [Sildenafil Citrate] Other (See Comments)    FLUSHING     Labs:  Results for orders placed or performed during the hospital encounter of 06/20/18 (from the past 48 hour(s))  CBC with Differential     Status: Abnormal   Collection Time: 06/20/18  5:50 PM  Result Value Ref Range   WBC 7.8 4.0 - 10.5 K/uL   RBC 5.73 4.22 - 5.81 MIL/uL   Hemoglobin 17.2 (H) 13.0 - 17.0 g/dL   HCT 51.1  39.0 - 52.0 %   MCV 89.2 78.0 - 100.0 fL   MCH 30.0 26.0 - 34.0 pg   MCHC 33.7 30.0 - 36.0 g/dL   RDW 13.1 11.5 - 15.5 %  Platelets 198 150 - 400 K/uL   Neutrophils Relative % 70 %   Neutro Abs 5.4 1.7 - 7.7 K/uL   Lymphocytes Relative 16 %   Lymphs Abs 1.3 0.7 - 4.0 K/uL   Monocytes Relative 13 %   Monocytes Absolute 1.0 0.1 - 1.0 K/uL   Eosinophils Relative 1 %   Eosinophils Absolute 0.1 0.0 - 0.7 K/uL   Basophils Relative 0 %   Basophils Absolute 0.0 0.0 - 0.1 K/uL   Immature Granulocytes 0 %   Abs Immature Granulocytes 0.0 0.0 - 0.1 K/uL    Comment: Performed at [facility] [address] [place], [place] [address]  Comprehensive metabolic panel     Status: Abnormal   Collection Time: 06/20/18  5:50 PM  Result Value Ref Range   Sodium 141 135 - 145 mmol/L   Potassium 3.0 (L) 3.5 - 5.1 mmol/L   Chloride 103 98 - 111 mmol/L   CO2 21 (L) 22 - 32 mmol/L   Glucose, Bld 95 70 - 99 mg/dL   BUN 51 (H) 6 - 20 mg/dL   Creatinine, Ser 9.20 (H) 0.61 - 1.24 mg/dL   Calcium 8.6 (L) 8.9 - 10.3 mg/dL   Total Protein 6.7 6.5 - 8.1 g/dL   Albumin 3.6 3.5 - 5.0 g/dL   AST 24 15 - 41 U/L   ALT 27 0 - 44 U/L   Alkaline Phosphatase 76 38 - 126 U/L   Total Bilirubin 1.4 (H) 0.3 - 1.2 mg/dL   GFR calc non Af Amer 6 (L) >60 mL/min   GFR calc Af Amer 7 (L) >60 mL/min    Comment: (NOTE) The eGFR has been calculated using the CKD EPI equation. This calculation has not been validated in all clinical situations. eGFR's persistently <60 mL/min signify possible Chronic Kidney Disease.    Anion gap 17 (H) 5 - 15    Comment: Performed at [facility], [place] [address], [place], [place] [address]  Lipase, blood     Status: None   Collection Time: 06/20/18  5:50 PM  Result Value Ref Range   Lipase 31 11 - 51 U/L    Comment: Performed at [facility] [address], [place], [place] [address]  Salicylate level     Status: None   Collection Time: 06/20/18  6:22 PM  Result  Value Ref Range   Salicylate Lvl <0.8 2.8 - 30.0 mg/dL    Comment: Performed at [facility] [address], [place], [place] [address]  Acetaminophen level     Status: Abnormal   Collection Time: 06/20/18  6:22 PM  Result Value Ref Range   Acetaminophen (Tylenol), Serum <10 (L) 10 - 30 ug/mL    Comment: (NOTE) Therapeutic concentrations vary significantly. A range of 10-30 ug/mL  may be an effective concentration for many patients. However, some  are best treated at concentrations outside of this range. Acetaminophen concentrations >150 ug/mL at 4 hours after ingestion  and >50 ug/mL at 12 hours after ingestion are often associated with  toxic reactions. Performed at [facility], [place] [address], [place],  [address]   Urinalysis, Routine w reflex microscopic     Status: Abnormal   Collection Time: 06/20/18  9:56 PM  Result Value Ref Range   Color, Urine YELLOW YELLOW   APPearance CLOUDY (A) CLEAR   Specific Gravity, Urine 1.009 1.005 - 1.030   pH 5.0 5.0 - 8.0  Glucose, UA NEGATIVE NEGATIVE mg/dL   Hgb urine dipstick MODERATE (A) NEGATIVE   Bilirubin Urine NEGATIVE NEGATIVE   Ketones, ur NEGATIVE NEGATIVE mg/dL   Protein, ur 100 (A) NEGATIVE mg/dL   Nitrite NEGATIVE NEGATIVE   Leukocytes, UA LARGE (A) NEGATIVE   RBC / HPF 11-20 0 - 5 RBC/hpf   WBC, UA >50 (H) 0 - 5 WBC/hpf   Bacteria, UA RARE (A) NONE SEEN   Squamous Epithelial / LPF 0-5 0 - 5   WBC Clumps PRESENT     Comment: Performed at [facility], [place] [address], [place], [place] [address]  Ethanol     Status: None   Collection Time: 06/21/18  2:00 AM  Result Value Ref Range   Alcohol, Ethyl (B) <10 <10 mg/dL    Comment: (NOTE) Lowest detectable limit for serum alcohol is 10 mg/dL. For medical purposes only. Performed at [facility], [place] [address] [place], [place] [address]   Protime-INR     Status: None   Collection Time: 06/21/18  2:00 AM  Result Value Ref Range    Prothrombin Time 13.4 11.4 - 15.2 seconds   INR 1.03     Comment: Performed at [facility] [address], [place], [place] [address]  Volatiles,Blood (acetone,ethanol,isoprop,methanol)     Status: None   Collection Time: 06/21/18  3:35 AM  Result Value Ref Range   Acetone, blood Negative 0.000 - 0.010 %    Comment: (NOTE)                                Detection Limit = 0.010 This test was developed and its performance characteristics determined by [facility]. It has not been cleared or approved by the Food and Drug Administration.    Ethanol, blood Negative 0.000 - 0.010 %    Comment:                                 Detection Limit = 0.010   Isopropanol, blood Negative 0.000 - 0.010 %    Comment:                                 Detection Limit = 0.010   Methanol, blood Negative 0.000 - 0.010 %    Comment: (NOTE)                                Detection Limit = 0.010            PHONED TO BONNIE D ON 06/21/18 AT 8:07 AM Performed At: [facility] [place], [place] [id number] Rush Farmer MD SJ:[id number]   CK     Status: None   Collection Time: 06/21/18  3:35 AM  Result Value Ref Range   Total CK 278 49 - 397 U/L    Comment: Performed at [facility], [place] [address] [place], [place] [address]  Ethylene glycol     Status: None   Collection Time: 06/21/18  3:35 AM  Result Value Ref Range   Ethylene Glycol Lvl None Detected None detected mg/dL    Comment: (NOTE) CALLED TO JULIE M ON May 21 2018 AT 9:30 AM BY LUANNE L  Detection Limit = 5        This test was developed and its performance        characteristics determined by [facility]. It has        not been cleared or approved by the Food and        Drug Administration. Performed At: [facility] [place], [place] [id number] Rush Farmer MD [id number]   Magnesium     Status: None   Collection Time: 06/21/18  3:35 AM  Result Value Ref  Range   Magnesium 2.1 1.7 - 2.4 mg/dL    Comment: Performed at [facility] [address] [place], [place] [address]  Basic metabolic panel     Status: Abnormal   Collection Time: 06/21/18 11:28 AM  Result Value Ref Range   Sodium 140 135 - 145 mmol/L   Potassium 3.4 (L) 3.5 - 5.1 mmol/L   Chloride 103 98 - 111 mmol/L   CO2 22 22 - 32 mmol/L   Glucose, Bld 122 (H) 70 - 99 mg/dL   BUN 57 (H) 6 - 20 mg/dL   Creatinine, Ser 10.92 (H) 0.61 - 1.24 mg/dL   Calcium 8.0 (L) 8.9 - 10.3 mg/dL   GFR calc non Af Amer 5 (L) >60 mL/min   GFR calc Af Amer 5 (L) >60 mL/min    Comment: (NOTE) The eGFR has been calculated using the CKD EPI equation. This calculation has not been validated in all clinical situations. eGFR's persistently <60 mL/min signify possible Chronic Kidney Disease.    Anion gap 15 5 - 15    Comment: Performed at [facility] [address] [place] [address]  CBC     Status: None   Collection Time: 06/21/18 11:28 AM  Result Value Ref Range   WBC 6.8 4.0 - 10.5 K/uL   RBC 5.30 4.22 - 5.81 MIL/uL   Hemoglobin 15.9 13.0 - 17.0 g/dL   HCT 46.3 39.0 - 52.0 %   MCV 87.4 78.0 - 100.0 fL   MCH 30.0 26.0 - 34.0 pg   MCHC 34.3 30.0 - 36.0 g/dL   RDW 13.0 11.5 - 15.5 %   Platelets 173 150 - 400 K/uL    Comment: Performed at [facility], [place] [address] [place], [place] [address]    Current Facility-Administered Medications  Medication Dose Route Frequency Provider Last Rate Last Dose  . 0.9 %  sodium chloride infusion  250 mL Intravenous PRN Emokpae, Courage, MD      . 0.9 %  sodium chloride infusion   Intravenous Continuous Denton Brick, Courage, MD 150 mL/hr at 06/21/18 1152    . acetaminophen (TYLENOL) tablet 650 mg  650 mg Oral Q6H PRN Roxan Hockey, MD   650 mg at 06/21/18 1310   Or  . acetaminophen (TYLENOL) suppository 650 mg  650 mg Rectal Q6H PRN Emokpae, Courage, MD      . acetaminophen (TYLENOL) tablet 650 mg  650 mg Oral Q6H PRN  Emokpae, Courage, MD      . albuterol (PROVENTIL) (2.5 MG/3ML) 0.083% nebulizer solution 2.5 mg  2.5 mg Nebulization Q2H PRN Emokpae, Courage, MD      . amLODipine (NORVASC) tablet 10 mg  10 mg Oral Daily Emokpae, Courage, MD      . cefTRIAXone (ROCEPHIN) 1 g in sodium chloride 0.9 % 100 mL IVPB  1 g Intravenous Q24H Roxan Hockey, MD      . Derrill Memo ON  06/22/2018] cefTRIAXone (ROCEPHIN) 1 g in sodium chloride 0.9 % 100 mL IVPB  1 g Intravenous Q24H Emokpae, Courage, MD      . heparin injection 5,000 Units  5,000 Units Subcutaneous Q8H Emokpae, Courage, MD      . hydrALAZINE (APRESOLINE) injection 5 mg  5 mg Intravenous Q2H PRN Emokpae, Courage, MD   5 mg at 06/21/18 0620  . hydrALAZINE (APRESOLINE) tablet 50 mg  50 mg Oral TID Roxan Hockey, MD   50 mg at 06/21/18 1145  . isosorbide mononitrate (IMDUR) 24 hr tablet 30 mg  30 mg Oral Daily Emokpae, Courage, MD   30 mg at 06/21/18 1146  . labetalol (NORMODYNE,TRANDATE) injection 10 mg  10 mg Intravenous Q4H PRN Emokpae, Courage, MD      . metoprolol succinate (TOPROL-XL) 24 hr tablet 100 mg  100 mg Oral Daily Emokpae, Courage, MD   100 mg at 06/21/18 1336  . ondansetron (ZOFRAN) injection 4 mg  4 mg Intravenous Q8H PRN Emokpae, Courage, MD      . ondansetron (ZOFRAN) tablet 4 mg  4 mg Oral Q6H PRN Emokpae, Courage, MD       Or  . ondansetron (ZOFRAN) injection 4 mg  4 mg Intravenous Q6H PRN Denton Brick, Courage, MD   4 mg at 06/21/18 1326  . polyethylene glycol (MIRALAX / GLYCOLAX) packet 17 g  17 g Oral Daily PRN Emokpae, Courage, MD      . sodium chloride flush (NS) 0.9 % injection 3 mL  3 mL Intravenous Q12H Emokpae, Courage, MD      . sodium chloride flush (NS) 0.9 % injection 3 mL  3 mL Intravenous PRN Emokpae, Courage, MD      . traZODone (DESYREL) tablet 50 mg  50 mg Oral QHS PRN Emokpae, Courage, MD      . zolpidem (AMBIEN) tablet 5 mg  5 mg Oral QHS PRN Roxan Hockey, MD        Musculoskeletal: Strength & Muscle Tone: within normal  limits Gait & Station: UTA since patient was lying in bed. Patient leans: N/A  Psychiatric Specialty Exam: Physical Exam  Nursing note and vitals reviewed. Constitutional: He is oriented to person, place, and time. He appears well-developed and well-nourished.  HENT:  Head: Normocephalic and atraumatic.  Neck: Normal range of motion.  Respiratory: Effort normal.  Musculoskeletal: Normal range of motion.  Neurological: He is alert and oriented to person, place, and time.  Skin: No rash noted.  Psychiatric: He has a normal mood and affect. His speech is normal and behavior is normal. Judgment and thought content normal. Cognition and memory are normal.    Review of Systems  Constitutional: Negative for chills and fever.  Cardiovascular: Negative for chest pain.  Gastrointestinal: Negative for abdominal pain, constipation, diarrhea, nausea and vomiting.  Psychiatric/Behavioral: Positive for depression. Negative for hallucinations, substance abuse and suicidal ideas. The patient is nervous/anxious and has insomnia.   All other systems reviewed and are negative.   Blood pressure (!) 167/106, pulse 97, temperature 98.8 F (37.1 C), temperature source Oral, resp. rate 18, height 6' (1.829 m), weight 111.1 kg (245 lb), SpO2 97 %.Body mass index is 33.23 kg/m.  General Appearance: Fairly Groomed, obese, middle aged, African American male, wearing a hospital gown and lying in bed. NAD.   Eye Contact:  Good  Speech:  Clear and Coherent and Normal Rate  Volume:  Normal  Mood:  "I'm better."  Affect:  Congruent and Full Range  Thought Process:  Goal Directed, Linear and Descriptions of Associations: Intact  Orientation:  Full (Time, Place, and Person)  Thought Content:  Logical  Suicidal Thoughts:  No  Homicidal Thoughts:  No  Memory:  Immediate;   Good Recent;   Good Remote;   Good  Judgement:  Fair  Insight:  Fair  Psychomotor Activity:  Normal  Concentration:  Concentration: Good  and Attention Span: Good  Recall:  Good  Fund of Knowledge:  Good  Language:  Good  Akathisia:  No  Handed:  Right  AIMS (if indicated):   N/A  Assets:  Communication Skills Desire for Improvement Financial Resources/Insurance Housing Resilience Social Support  ADL's:  Intact  Cognition:  WNL  Sleep:   Recently poor due to illness.    Assessment:  Bryan Riggs is a 52 y.o. male who was admitted with AKI due to suicide attempt by drug overdose with Tylenol, Aleve, OTC sleep aid and antifreeze ingestion 2 weeks ago. He reports an improvement in his mood although he continues to have the same stressors including finances. His suicide attempt was impulsive and he reports regret. He has reached out to close friends and has a strong support system. He is future oriented and plans to attend therapy that is offered through his job as well as take time off from work. He is help seeking and presented to the hospital on his own accord due to feeling ill. He denies SI, HI or AVH at this time. He is not an imminent risk of harm to self or others and is not psychotic. He does not warrant inpatient psychiatric hospitalization at this time and is more appropriate for outpatient services.   Treatment Plan Summary: -Patient will follow up with therapy services offered through his job.  -Patient is able to safety plan and was educated about safe storage of guns at home.  -Patient is psychiatrically cleared. Psychiatry will sign off on patient at this time. Please consult psychiatry again as needed.    Disposition: No evidence of imminent risk to self or others at present.   Patient does not meet criteria for psychiatric inpatient admission.  Faythe Dingwall, DO 06/21/2018 1:43 PM

## 2018-06-21 NOTE — ED Notes (Signed)
No Lunch was ordered for Lunch Pt. Is NPO at this time.

## 2018-06-21 NOTE — ED Notes (Signed)
MD returned page and stated will be down in approx 15 min

## 2018-06-21 NOTE — ED Notes (Signed)
Attempted report x1. 

## 2018-06-22 DIAGNOSIS — Q2543 Congenital aneurysm of aorta: Secondary | ICD-10-CM

## 2018-06-22 LAB — CBC
HCT: 46.6 % (ref 39.0–52.0)
HEMOGLOBIN: 15.6 g/dL (ref 13.0–17.0)
MCH: 30.1 pg (ref 26.0–34.0)
MCHC: 33.5 g/dL (ref 30.0–36.0)
MCV: 90 fL (ref 78.0–100.0)
Platelets: 195 10*3/uL (ref 150–400)
RBC: 5.18 MIL/uL (ref 4.22–5.81)
RDW: 13.3 % (ref 11.5–15.5)
WBC: 7.4 10*3/uL (ref 4.0–10.5)

## 2018-06-22 LAB — HIV ANTIBODY (ROUTINE TESTING W REFLEX): HIV Screen 4th Generation wRfx: NONREACTIVE

## 2018-06-22 LAB — BASIC METABOLIC PANEL
Anion gap: 19 — ABNORMAL HIGH (ref 5–15)
BUN: 68 mg/dL — AB (ref 6–20)
CALCIUM: 7.8 mg/dL — AB (ref 8.9–10.3)
CO2: 16 mmol/L — AB (ref 22–32)
Chloride: 104 mmol/L (ref 98–111)
Creatinine, Ser: 12.39 mg/dL — ABNORMAL HIGH (ref 0.61–1.24)
GFR calc Af Amer: 5 mL/min — ABNORMAL LOW (ref >60)
GFR, EST NON AFRICAN AMERICAN: 4 mL/min — AB (ref >60)
Glucose, Bld: 82 mg/dL (ref 70–99)
POTASSIUM: 3.8 mmol/L (ref 3.5–5.1)
Sodium: 139 mmol/L (ref 135–145)

## 2018-06-22 LAB — URINE CULTURE
Culture: NO GROWTH
Special Requests: NORMAL

## 2018-06-22 LAB — LACTIC ACID, PLASMA
LACTIC ACID, VENOUS: 0.8 mmol/L (ref 0.5–1.9)
Lactic Acid, Venous: 0.6 mmol/L (ref 0.5–1.9)

## 2018-06-22 MED ORDER — SODIUM CHLORIDE 0.9 % IV SOLN
1.0000 g | INTRAVENOUS | Status: AC
  Administered 2018-06-22: 1 g via INTRAVENOUS
  Filled 2018-06-22: qty 10

## 2018-06-22 NOTE — Progress Notes (Signed)
Sp[facility] with Bryan Riggs[facility] updated patient labs and condition. Patient remains stable. Will continue to monitor. Dondra SpryMoore, Trasean Delima Islee, RN

## 2018-06-22 NOTE — Consult Note (Signed)
Bryan Riggs Admit Date: 06/20/2018 06/22/2018 Bryan Riggs Requesting Physician:  Bryan Newcomer MD  Reason for Consult:  AKI HPI:  52 year old male admitted yesterday after presenting with lower back pain.  In the emergency room he was found to have acute kidney injury.  Relevant information included an intentional overdose of Aleve (says took 9-10pills) and ingesting a small quantity of ethylene glycol because he had an intractable headache and wanted the pain to go away.  He did not continue with any of these behaviors and did not seek medical care at that time.  His baseline creatinine is around 1.5 last checked 2 years ago.  CT of the abdomen looking for any evidence of obstructive uropathy was negative for hydronephrosis, stone; kidneys described as normal.  Urinalysis with 2+ protein, 11-20 erythrocytes per high-power field, greater than 50 leukocytes; he is being treated for urinary tract infection with ceftriaxone; urine culture is no growth.  Admission labs had a serum bicarbonate of 21 with an anion gap of 17.  His creatinine was 9.2 at that time.  Urine osmolality was not checked.  CK value was normal.  Creatinine is up trended since that time, this morning being 12.39 with a potassium of 3.8 and a bicarbonate of 16, anion gap of 19.  Patient continue taking his lisinopril.  No IV contrast exposure.  He has chronic mild edema in the legs, 1+ today.  Denies any lower urinary tract symptoms.  No unusual rashes, arthralgias, epistaxis, hemoptysis, unusual bruising or petechia.  His main concern is ongoing back pain.  Urine output is nonoliguric.  He has been volume resuscitated with normal saline.  Weight has increased from admission.  Appetite is stable.  No asterixis.  No nausea or vomiting.   Creat (mg/dL)  Date Value  16/08/9603 1.55 (H)  09/16/2015 1.24  09/10/2014 1.50 (H)   Creatinine, Ser (mg/dL)  Date Value  54/07/8118 12.39 (H)  06/21/2018 10.92 (H)  06/20/2018 9.20  (H)  07/18/2013 1.49 (H)  ] I/Os: I/O last 3 completed shifts: In: 4896.2 [P.O.:150; I.V.:1950; IV Piggyback:2796.2] Out: 600 [Urine:600]   ROS  Balance of 12 systems is negative w/ exceptions as above  PMH  Past Medical History:  Diagnosis Date  . Aberrant subclavian artery    RIGHT...PER CT CHEST...07/20/13  . AKI (acute kidney injury) (HCC) 06/2018  . 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 Chest pain CT ANGIO CHEST 07/20/13   LED TO DISCOVERY OF SINUS OF VALSALVA  ANEURYSM  . Erectile dysfunction    DR. NESI  . History of kidney stones   . Hypertension   . Kidney stone    PSH  Past Surgical History:  Procedure Laterality Date  . KNEE ARTHROPLASTY    . NO PAST SURGERIES    . ROTATOR CUFF REPAIR Right    FH  Family History  Problem Relation Age of Onset  . Cancer Mother        BREAST CANCER  . Cancer Father        MUTIPLE MYELOMA  . Hypertension Father   . Cancer Brother        ? TYPE   SH  reports that he has never smoked. He has never used smokeless tobacco. He reports that he drinks alcohol. He reports that he does not use drugs. Allergies  Allergies  Allergen Reactions  . Sulfasalazine Nausea And Vomiting  . Sulfa Antibiotics Rash and Nausea  And Vomiting    Break out, itching  . Viagra [Sildenafil Citrate] Other (See Comments)    FLUSHING    Home medications Prior to Admission medications   Medication Sig Start Date End Date Taking? Authorizing Provider  amLODipine (NORVASC) 10 MG tablet Take 1 tablet (10 mg total) by mouth daily. 03/14/18  Yes Lyn Records, MD  lisinopril-hydrochlorothiazide (PRINZIDE,ZESTORETIC) 20-25 MG tablet Take 1 tablet by mouth daily. 06/23/17  Yes Robbie Lis M, PA-C  metoprolol succinate (TOPROL-XL) 100 MG 24 hr tablet TAKE 1/2 TABLET(50 MG) BY MOUTH DAILY 06/23/17  Yes Robbie Lis M, PA-C  sildenafil (VIAGRA) 100 MG tablet TAKE 1 TABLET(100 MG) BY MOUTH DAILY AS  NEEDED FOR ERECTILE DYSFUNCTION 06/23/17  Yes Robbie Lis M, PA-C    Current Medications Scheduled Meds: . amLODipine  10 mg Oral Daily  . heparin  5,000 Units Subcutaneous Q8H  . hydrALAZINE  50 mg Oral TID  . isosorbide mononitrate  30 mg Oral Daily  . metoprolol succinate  100 mg Oral Daily  . sodium chloride flush  3 mL Intravenous Q12H   Continuous Infusions: . sodium chloride    . sodium chloride 150 mL/hr at 06/22/18 0840  . cefTRIAXone (ROCEPHIN)  IV 1 g (06/21/18 2225)   PRN Meds:.sodium chloride, acetaminophen **OR** acetaminophen, acetaminophen, albuterol, hydrALAZINE, labetalol, ondansetron (ZOFRAN) IV, ondansetron **OR** ondansetron (ZOFRAN) IV, polyethylene glycol, sodium chloride flush, traZODone, zolpidem  CBC Recent Labs  Lab 06/20/18 1750 06/21/18 1128 06/22/18 0532  WBC 7.8 6.8 7.4  NEUTROABS 5.4  --   --   HGB 17.2* 15.9 15.6  HCT 51.1 46.3 46.6  MCV 89.2 87.4 90.0  PLT 198 173 195   Basic Metabolic Panel Recent Labs  Lab 06/20/18 1750 06/21/18 1128 06/22/18 0532  NA 141 140 139  K 3.0* 3.4* 3.8  CL 103 103 104  CO2 21* 22 16*  GLUCOSE 95 122* 82  BUN 51* 57* 68*  CREATININE 9.20* 10.92* 12.39*  CALCIUM 8.6* 8.0* 7.8*    Physical Exam  Blood pressure (!) 140/94, pulse 74, temperature 98.3 F (36.8 C), temperature source Oral, resp. rate 20, height 6' (1.829 m), weight 117.5 kg, SpO2 98 %. GEN: No acute distress, well-developed, well-nourished, above average muscle mass ENT: NCAT EYES: EOMI CV: RRR, normal S1-S2, no rub PULM: Clear bilaterally, normal work of breathing ABD: Soft, nontender, nondistended SKIN: No rashes or lesions EXT: Trace to 1+ pitting edema Neurologically intact   Assessment 80M with acute kidney injury, nonoliguric.  Etiology is unclear but likely related to heavy NSAID use, ongoing lisinopril, at the same time as ingesting a small amount of ethylene glycol.  Is no longer present in the system, having been  metabolized.  No role for fomepizole or hemodialysis to treat toxic ingestion.  Patient has developed a worsening acidosis, likely explained by his ongoing normal saline administration and impaired ammoniagenesis.  No uremic symptoms currently.  1. Nonoliguric AKI, baseline serum creatinine 1.5, likely related to ACE inhibitor+NSAIDs while taking ethylene glycol; no structural renal issues; not overtly uremic. CK negative 2. Probable UTI on ceftriaxone 3. Purposeful ethylene glycol ingestion and overdose of Aleve; seen by psychiatry; cleared for outpatient evaluation 4. Hypertension, on lisinopril/HCTZ as outpatient, held 5. Metabolic acidosis with increased anion gap  Plan 1. No immediate need for hemodialysis, very possible he will require 2. I think he has had an adequate fluid resuscitation and will stop normal saline 3. Will make n.p.o. after midnight, if labs worsen, would  probably place a tunneled catheter prior to the weekend in anticipation of dialysis. 4. Continue to hold lisinopril, hold diuretics 5. Daily weights, Daily Renal Panel, Strict I/Os, Avoid nephrotoxins (NSAIDs, judicious IV Contrast)    Sabra Heckyan Bryan Greff MD [phone] pgr 06/22/2018, 2:05 PM

## 2018-06-22 NOTE — Progress Notes (Signed)
[facility] PROGRESS NOTE  Bryan Riggs  BJY:[id number] DOB: 10/31/1966 DOA: 06/20/2018 PCP: Clovis RileyMitchell, L.August Saucerean, MD  Brief Narrative: Bryan Riggs is a 52 y.o. male with a history of HTN who presented to the ED 8/7 with multiple complaints including dizziness, fatigue, malaise, nausea, a couple episodes of emesis, loose stools and pain in the right flank/lower back. He additionally admitted to an intentional ingestion of aleve (~10 pills), tylenol, a sleep aid (both unknown), and about 1/2 cup of antifreeze mixed in [place] about 2 weeks prior. This was impulsive, after returning from work that day with a headache, back ache, and severe stress around financial issues with insomnia and depression. He felt embarrassed and remorseful, did not seek medical attention and has continued to go to work, but sought care when multiple symptoms as above worsened. He was found to be in renal failure with creatinine 9.2 (from previous 1.55), BUN 51. Toxicology screen, including tylenol, salicylates, methanol, ethanol, ethylene glycol, was negative. CT renal study showed apparently normal kidneys without hydronephrosis. Urinalysis showed proteinuria, pyuria, and hematuria. Ceftriaxone and IV fluids were administered, poison control consulted, and patient admitted. He has remained nonoliguric, though creatinine has trended upward and nephrology was consulted. Psychiatry has evaluated the patient and cleared him for discharge to the appropriate setting.  Subjective: Feels fine. Some nausea, no vomiting, but has had metallic taste in mouth since ingestion. Also some chronic LE swelling which is at baseline. No itching, hiccoughs. Feels his mood is in a positive trajectory.  Objective: BP (!) 140/94 (BP Location: Left Arm)   Pulse 74   Temp 98.3 F (36.8 C) (Oral)   Resp 20   Ht 6' (1.829 m)   Wt 117.5 kg   SpO2 98%   BMI 35.14 kg/m   Gen: Well appearing male sitting in chair Pulm: Clear  and nonlabored on room air  CV: RRR, no murmur, no rub, no JVD, trace nonpitting LE edema GI: Soft, NT, ND, +BS  Neuro: Alert and oriented. No focal deficits. No asterixis. 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 Has acidosis with anion gap, lactic acid normal. Suspect acidosis is from excessive saline. No current uremia. K wnl. - Holding ACE, has been aggressive hydrated and is taking good po with good UOP, so will stop IV fluids.  - Monitor BMP daily. Strict I/O. - Avoid contrast, nephrotoxins. - Nephrology consulted. Pt to be made NPO p MN for temp cath placement 8/9 in the case he has progressive renal failure.   Intentional ingestion of toxic agents: 2 weeks PTA, remorseful, feels it was a poor and impulsive decision. Has support systems in place and no previous attempts. INR, LFTs, CK not elevated. - Psychiatry evaluation 8/7, no need for inpatient psychiatry hospitalization.  - Continue   Pyuria: Not overtly symptomatic.  - Continue ceftriaxone pending urine culture.   HTN:  - Holding diuretic and ACE due to renal failure.  - Norvasc 10mg , hydralazine, imdur, metoprolol.  Tyrone Nineyan B Fatoumata Albaugh, MD [facility] [email or web address] Password [email or web address] 06/22/2018, 4:50 PM

## 2018-06-23 ENCOUNTER — Encounter (HOSPITAL_COMMUNITY): Payer: Self-pay | Admitting: Interventional Radiology

## 2018-06-23 ENCOUNTER — Inpatient Hospital Stay (HOSPITAL_COMMUNITY): Payer: BLUE CROSS/BLUE SHIELD

## 2018-06-23 HISTORY — PX: IR FLUORO GUIDE CV LINE RIGHT: IMG2283

## 2018-06-23 HISTORY — PX: IR US GUIDE VASC ACCESS RIGHT: IMG2390

## 2018-06-23 LAB — RENAL FUNCTION PANEL
ALBUMIN: 2.9 g/dL — AB (ref 3.5–5.0)
ALBUMIN: 2.8 g/dL — AB (ref 3.5–5.0)
ANION GAP: 21 — AB (ref 5–15)
ANION GAP: 19 — AB (ref 5–15)
BUN: 84 mg/dL — ABNORMAL HIGH (ref 6–20)
BUN: 91 mg/dL — ABNORMAL HIGH (ref 6–20)
CALCIUM: 8.1 mg/dL — AB (ref 8.9–10.3)
CO2: 16 mmol/L — AB (ref 22–32)
CO2: 17 mmol/L — ABNORMAL LOW (ref 22–32)
CREATININE: 15.53 mg/dL — AB (ref 0.61–1.24)
Calcium: 7.8 mg/dL — ABNORMAL LOW (ref 8.9–10.3)
Chloride: 103 mmol/L (ref 98–111)
Chloride: 102 mmol/L (ref 98–111)
Creatinine, Ser: 16.52 mg/dL — ABNORMAL HIGH (ref 0.61–1.24)
GFR calc Af Amer: 3 mL/min — ABNORMAL LOW (ref >60)
GFR, EST AFRICAN AMERICAN: 4 mL/min — AB (ref >60)
GFR, EST NON AFRICAN AMERICAN: 3 mL/min — AB (ref >60)
GFR, EST NON AFRICAN AMERICAN: 3 mL/min — AB (ref >60)
Glucose, Bld: 84 mg/dL (ref 70–99)
Glucose, Bld: 116 mg/dL — ABNORMAL HIGH (ref 70–99)
PHOSPHORUS: 9.1 mg/dL — AB (ref 2.5–4.6)
PHOSPHORUS: 9.2 mg/dL — AB (ref 2.5–4.6)
POTASSIUM: 3.4 mmol/L — AB (ref 3.5–5.1)
Potassium: 3.7 mmol/L (ref 3.5–5.1)
Sodium: 140 mmol/L (ref 135–145)
Sodium: 138 mmol/L (ref 135–145)

## 2018-06-23 LAB — CBC
HEMATOCRIT: 45.7 % (ref 39.0–52.0)
Hemoglobin: 15.7 g/dL (ref 13.0–17.0)
MCH: 30.2 pg (ref 26.0–34.0)
MCHC: 34.4 g/dL (ref 30.0–36.0)
MCV: 87.9 fL (ref 78.0–100.0)
Platelets: 219 10*3/uL (ref 150–400)
RBC: 5.2 MIL/uL (ref 4.22–5.81)
RDW: 13.1 % (ref 11.5–15.5)
WBC: 5.4 10*3/uL (ref 4.0–10.5)

## 2018-06-23 LAB — ALT: ALT: 20 U/L (ref 0–44)

## 2018-06-23 MED ORDER — LIDOCAINE HCL 1 % IJ SOLN
INTRAMUSCULAR | Status: AC
  Filled 2018-06-23: qty 20

## 2018-06-23 MED ORDER — PENTAFLUOROPROP-TETRAFLUOROETH EX AERO: 1.0000 "application " | INHALATION_SPRAY | CUTANEOUS | Status: DC | PRN

## 2018-06-23 MED ORDER — CHLORHEXIDINE GLUCONATE CLOTH 2 % EX PADS
6.0000 | MEDICATED_PAD | Freq: Every day | CUTANEOUS | Status: DC
  Administered 2018-06-24 – 2018-06-30 (×6): 6 via TOPICAL

## 2018-06-23 MED ORDER — SODIUM CHLORIDE 0.9 % IV SOLN: 100.0000 mL | INTRAVENOUS | Status: DC | PRN

## 2018-06-23 MED ORDER — HEPARIN SODIUM (PORCINE) 1000 UNIT/ML DIALYSIS
1000.0000 [IU] | INTRAMUSCULAR | Status: DC | PRN
  Administered 2018-06-23 – 2018-06-25 (×2): 1000 [IU] via INTRAVENOUS_CENTRAL

## 2018-06-23 MED ORDER — LIDOCAINE HCL (PF) 1 % IJ SOLN
INTRAMUSCULAR | Status: AC | PRN
  Administered 2018-06-23: 10 mL

## 2018-06-23 MED ORDER — LIDOCAINE-PRILOCAINE 2.5-2.5 % EX CREA: 1.0000 "application " | TOPICAL_CREAM | CUTANEOUS | Status: DC | PRN

## 2018-06-23 MED ORDER — FENTANYL CITRATE (PF) 100 MCG/2ML IJ SOLN
INTRAMUSCULAR | Status: AC
  Filled 2018-06-23: qty 2

## 2018-06-23 MED ORDER — HEPARIN SODIUM (PORCINE) 5000 UNIT/ML IJ SOLN
5000.0000 [IU] | Freq: Three times a day (TID) | INTRAMUSCULAR | Status: DC
  Administered 2018-06-24 – 2018-06-25 (×3): 5000 [IU] via SUBCUTANEOUS
  Filled 2018-06-23 (×4): qty 1

## 2018-06-23 MED ORDER — LIDOCAINE HCL (PF) 1 % IJ SOLN: 5.0000 mL | INTRAMUSCULAR | Status: DC | PRN

## 2018-06-23 MED ORDER — MIDAZOLAM HCL 2 MG/2ML IJ SOLN
INTRAMUSCULAR | Status: AC
  Filled 2018-06-23: qty 2

## 2018-06-23 MED ORDER — CEFAZOLIN SODIUM-DEXTROSE 2-4 GM/100ML-% IV SOLN
INTRAVENOUS | Status: AC
  Filled 2018-06-23: qty 100

## 2018-06-23 MED ORDER — MIDAZOLAM HCL 2 MG/2ML IJ SOLN
INTRAMUSCULAR | Status: AC | PRN
  Administered 2018-06-23: 0.5 mg via INTRAVENOUS
  Administered 2018-06-23: 1 mg via INTRAVENOUS

## 2018-06-23 MED ORDER — CEFAZOLIN SODIUM-DEXTROSE 2-4 GM/100ML-% IV SOLN
2.0000 g | INTRAVENOUS | Status: AC
  Administered 2018-06-23: 2 g via INTRAVENOUS
  Filled 2018-06-23: qty 100

## 2018-06-23 MED ORDER — HEPARIN SODIUM (PORCINE) 1000 UNIT/ML IJ SOLN
INTRAMUSCULAR | Status: AC
  Filled 2018-06-23: qty 1

## 2018-06-23 MED ORDER — ALTEPLASE 2 MG IJ SOLR: 2.0000 mg | Freq: Once | INTRAMUSCULAR | Status: DC | PRN

## 2018-06-23 MED ORDER — STERILE WATER FOR INJECTION IV SOLN
INTRAVENOUS | Status: DC
  Administered 2018-06-23 – 2018-06-24 (×4): via INTRAVENOUS
  Filled 2018-06-23 (×6): qty 850

## 2018-06-23 MED ORDER — FENTANYL CITRATE (PF) 100 MCG/2ML IJ SOLN
INTRAMUSCULAR | Status: AC | PRN
  Administered 2018-06-23: 50 ug via INTRAVENOUS
  Administered 2018-06-23: 25 ug via INTRAVENOUS

## 2018-06-23 NOTE — Consult Note (Signed)
Chief Complaint: Patient was seen in consultation today for tunneled dialysis catheter placement at the request of Dr Hansel Feinstein   Supervising Physician: Irish Lack  Patient Status: [facility] - In-pt  History of Present Illness: Bryan Riggs is a 52 y.o. male   HTN Dizziness; fatigue and malaise Intentional ingestion 10 pills Aleve, Tylenol; anti freeze approx 2 weeks ago Did not seek medical attention Found to be in Acute renal failure Cr trending upward was 1.5 2 yrs ago  Dr Arrie Aran requesting tunneled dialysis catheter placement Plans for dialysis to initiate asap    Past Medical History:  Diagnosis Date  . Aberrant subclavian artery    RIGHT...PER CT CHEST...07/20/13  . AKI (acute kidney injury) (HCC) 06/2018  . 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 Chest pain CT ANGIO CHEST 07/20/13   LED TO DISCOVERY OF SINUS OF VALSALVA  ANEURYSM  . Erectile dysfunction    DR. NESI  . History of kidney stones   . Hypertension   . Kidney stone     Past Surgical History:  Procedure Laterality Date  . KNEE ARTHROPLASTY    . NO PAST SURGERIES    . ROTATOR CUFF REPAIR Right     Allergies: Sulfasalazine; Sulfa antibiotics; and Viagra [sildenafil citrate]  Medications: Prior to Admission medications   Medication Sig Start Date End Date Taking? Authorizing Provider  amLODipine (NORVASC) 10 MG tablet Take 1 tablet (10 mg total) by mouth daily. 03/14/18  Yes Lyn Records, MD  lisinopril-hydrochlorothiazide (PRINZIDE,ZESTORETIC) 20-25 MG tablet Take 1 tablet by mouth daily. 06/23/17  Yes Robbie Lis M, PA-C  metoprolol succinate (TOPROL-XL) 100 MG 24 hr tablet TAKE 1/2 TABLET(50 MG) BY MOUTH DAILY 06/23/17  Yes Robbie Lis M, PA-C  sildenafil (VIAGRA) 100 MG tablet TAKE 1 TABLET(100 MG) BY MOUTH DAILY AS NEEDED FOR ERECTILE DYSFUNCTION 06/23/17  Yes Allayne Butcher, PA-C     Family History    Problem Relation Age of Onset  . Cancer Mother        BREAST CANCER  . Cancer Father        MUTIPLE MYELOMA  . Hypertension Father   . Cancer Brother        ? TYPE    Social History   Socioeconomic History  . Marital status: Married    Spouse name: Not on file  . Number of children: Not on file  . Years of education: Not on file  . Highest education level: Not on file  Occupational History  . Not on file  Social Needs  . Financial resource strain: Not on file  . Food insecurity:    Worry: Not on file    Inability: Not on file  . Transportation needs:    Medical: Not on file    Non-medical: Not on file  Tobacco Use  . Smoking status: Never Smoker  . Smokeless tobacco: Never Used  Substance and Sexual Activity  . Alcohol use: Yes    Comment: RARE  . Drug use: No  . Sexual activity: Not on file  Lifestyle  . Physical activity:    Days per week: Not on file    Minutes per session: Not on file  . Stress: Not on file  Relationships  . Social connections:    Talks on phone: Not on file    Gets together: Not on file    Attends religious service: Not on file  Active member of club or organization: Not on file    Attends meetings of clubs or organizations: Not on file    Relationship status: Not on file  Other Topics Concern  . Not on file  Social History Narrative  . Not on file     Review of Systems: A 12 point ROS discussed and pertinent positives are indicated in the HPI above.  All other systems are negative.  Review of Systems  Constitutional: Negative for fatigue and fever.  Respiratory: Negative for cough and shortness of breath.   Cardiovascular: Negative for chest pain.  Gastrointestinal: Negative for abdominal pain.  Neurological: Negative for weakness.  Psychiatric/Behavioral: Negative for behavioral problems and confusion.    Vital Signs: BP (!) 150/93 (BP Location: Left Arm)   Pulse 79   Temp 98.9 F (37.2 C) (Oral)   Resp 18   Ht 6'  (1.829 m)   Wt 265 lb 3.4 oz (120.3 kg)   SpO2 96%   BMI 35.97 kg/m   Physical Exam  Constitutional: He is oriented to person, place, and time.  Cardiovascular: Normal rate and regular rhythm.  Pulmonary/Chest: Effort normal and breath sounds normal.  Abdominal: Soft. Bowel sounds are normal.  Musculoskeletal: Normal range of motion.  Neurological: He is alert and oriented to person, place, and time.  Skin: Skin is warm and dry.  Psychiatric: He has a normal mood and affect. His behavior is normal. Judgment and thought content normal.  Vitals reviewed.   Imaging: Ct Renal Stone Study  Result Date: 06/20/2018 CLINICAL DATA:  Acute right flank pain. EXAM: CT ABDOMEN AND PELVIS WITHOUT CONTRAST TECHNIQUE: Multidetector CT imaging of the abdomen and pelvis was performed following the standard protocol without IV contrast. COMPARISON:  CT scan of October 02, 2012. FINDINGS: Lower chest: No acute abnormality. Hepatobiliary: No focal liver abnormality is seen. No gallstones, gallbladder wall thickening, or biliary dilatation. Pancreas: Unremarkable. No pancreatic ductal dilatation or surrounding inflammatory changes. Spleen: Normal in size without focal abnormality. Adrenals/Urinary Tract: Adrenal glands are unremarkable. Kidneys are normal, without renal calculi, focal lesion, or hydronephrosis. Bladder is unremarkable. Stomach/Bowel: Stomach is within normal limits. Appendix appears normal. No evidence of bowel wall thickening, distention, or inflammatory changes. Vascular/Lymphatic: No significant vascular findings are present. No enlarged abdominal or pelvic lymph nodes. Reproductive: Prostate is unremarkable. Other: No abdominal wall hernia or abnormality. No abdominopelvic ascites. Musculoskeletal: No acute or significant osseous findings. IMPRESSION: No definite abnormality seen in the abdomen or pelvis. Electronically Signed   By: Lupita RaiderJames  Green Jr, M.D.   On: 06/20/2018 19:19     Labs:  CBC: Recent Labs    06/20/18 1750 06/21/18 1128 06/22/18 0532  WBC 7.8 6.8 7.4  HGB 17.2* 15.9 15.6  HCT 51.1 46.3 46.6  PLT 198 173 195    COAGS: Recent Labs    06/21/18 0200  INR 1.03    BMP: Recent Labs    06/20/18 1750 06/21/18 1128 06/22/18 0532 06/23/18 0512  NA 141 140 139 140  K 3.0* 3.4* 3.8 3.7  CL 103 103 104 103  CO2 21* 22 16* 16*  GLUCOSE 95 122* 82 84  BUN 51* 57* 68* 84*  CALCIUM 8.6* 8.0* 7.8* 8.1*  CREATININE 9.20* 10.92* 12.39* 15.53*  GFRNONAA 6* 5* 4* 3*  GFRAA 7* 5* 5* 4*    LIVER FUNCTION TESTS: Recent Labs    06/20/18 1750 06/23/18 0512  BILITOT 1.4*  --   AST 24  --   ALT  27  --   ALKPHOS 76  --   PROT 6.7  --   ALBUMIN 3.6 2.9*    TUMOR MARKERS: No results for input(s): AFPTM, CEA, CA199, CHROMGRNA in the last 8760 hours.  Assessment and Plan:  Acute renal failure Cr still trending higher Plans for initiation of dialysis asap--- Home with catheter Scheduled for tunneled dialysis catheter placement Risks and benefits discussed with the patient including, but not limited to bleeding, infection, vascular injury, pneumothorax which may require chest tube placement, air embolism or even death  All of the patient's questions were answered, patient is agreeable to proceed. Consent signed and in chart.   Thank you for this interesting consult.  I greatly enjoyed meeting SANDFORD DIOP and look forward to participating in their care.  A copy of this report was sent to the requesting provider on this date.  Electronically Signed: Robet Leu, PA-C 06/23/2018, 10:37 AM   I spent a total of 20 Minutes    in face to face in clinical consultation, greater than 50% of which was counseling/coordinating care for tunneled dialysis catheter placement

## 2018-06-23 NOTE — Progress Notes (Signed)
S: Not feeling well today.  Complaining of some nausea and funny taste O:BP (!) 150/95 (BP Location: Left Arm)   Pulse 76   Temp 97.8 F (36.6 C) (Oral)   Resp 20   Ht 6' (1.829 m)   Wt 120.3 kg   SpO2 93%   BMI 35.97 kg/m   Intake/Output Summary (Last 24 hours) at 06/23/2018 1710 Last data filed at 06/23/2018 1332 Gross per 24 hour  Intake 89.29 ml  Output 700 ml  Net -610.71 ml   Intake/Output: I/O last 3 completed shifts: In: 2602.7 [P.O.:360; I.V.:1949.6; IV Piggyback:293.2] Out: 700 [Urine:700]  Intake/Output this shift:  Total I/O In: 89.3 [I.V.:89.3] Out: 550 [Urine:550] Weight change: 4.179 kg Gen:NAD CVS: no rub Resp: cta Abd: benign Ext: 1+ edema  Recent Labs  Lab 06/20/18 1750 06/21/18 1128 06/22/18 0532 06/23/18 0512  NA 141 140 139 140  K 3.0* 3.4* 3.8 3.7  CL 103 103 104 103  CO2 21* 22 16* 16*  GLUCOSE 95 122* 82 84  BUN 51* 57* 68* 84*  CREATININE 9.20* 10.92* 12.39* 15.53*  ALBUMIN 3.6  --   --  2.9*  CALCIUM 8.6* 8.0* 7.8* 8.1*  PHOS  --   --   --  9.1*  AST 24  --   --   --   ALT 27  --   --   --    Liver Function Tests: Recent Labs  Lab 06/20/18 1750 06/23/18 0512  AST 24  --   ALT 27  --   ALKPHOS 76  --   BILITOT 1.4*  --   PROT 6.7  --   ALBUMIN 3.6 2.9*   Recent Labs  Lab 06/20/18 1750  LIPASE 31   No results for input(s): AMMONIA in the last 168 hours. CBC: Recent Labs  Lab 06/20/18 1750 06/21/18 1128 06/22/18 0532  WBC 7.8 6.8 7.4  NEUTROABS 5.4  --   --   HGB 17.2* 15.9 15.6  HCT 51.1 46.3 46.6  MCV 89.2 87.4 90.0  PLT 198 173 195   Cardiac Enzymes: Recent Labs  Lab 06/21/18 0335  CKTOTAL 278   CBG: No results for input(s): GLUCAP in the last 168 hours.  Iron Studies: No results for input(s): IRON, TIBC, TRANSFERRIN, FERRITIN in the last 72 hours. Studies/Results: No results found. Marland Kitchen. amLODipine  10 mg Oral Daily  . [START ON 06/24/2018] Chlorhexidine Gluconate Cloth  6 each Topical Q0600  . [START  ON 06/24/2018] heparin  5,000 Units Subcutaneous Q8H  . hydrALAZINE  50 mg Oral TID  . isosorbide mononitrate  30 mg Oral Daily  . metoprolol succinate  100 mg Oral Daily  . sodium chloride flush  3 mL Intravenous Q12H    BMET    Component Value Date/Time   NA 140 06/23/2018 0512   K 3.7 06/23/2018 0512   CL 103 06/23/2018 0512   CO2 16 (L) 06/23/2018 0512   GLUCOSE 84 06/23/2018 0512   BUN 84 (H) 06/23/2018 0512   CREATININE 15.53 (H) 06/23/2018 0512   CREATININE 1.55 (H) 12/01/2015 1442   CALCIUM 8.1 (L) 06/23/2018 0512   GFRNONAA 3 (L) 06/23/2018 0512   GFRAA 4 (L) 06/23/2018 0512   CBC    Component Value Date/Time   WBC 7.4 06/22/2018 0532   RBC 5.18 06/22/2018 0532   HGB 15.6 06/22/2018 0532   HCT 46.6 06/22/2018 0532   PLT 195 06/22/2018 0532   MCV 90.0 06/22/2018 0532   MCH  30.1 06/22/2018 0532   MCHC 33.5 06/22/2018 0532   RDW 13.3 06/22/2018 0532   LYMPHSABS 1.3 06/20/2018 1750   MONOABS 1.0 06/20/2018 1750   EOSABS 0.1 06/20/2018 1750   BASOSABS 0.0 06/20/2018 1750    8M with acute kidney injury, nonoliguric.  Etiology is unclear but likely related to heavy NSAID use, ongoing lisinopril, at the same time as ingesting a small amount of ethylene glycol.  Is no longer present in the system, having been metabolized.  No role for fomepizole or hemodialysis to treat toxic ingestion.  Patient has developed a worsening acidosis, likely explained by his ongoing normal saline administration and impaired ammoniagenesis.  No uremic symptoms currently.  Assessment/Plan:  1. AKI/CKD stage 3, nonoliguric- likely due to ATN from ACE- inhibitors + NSAIds, however also very concerning for irreversible damage from ethylene glycol ingestion a week ago.  His renal function has continued to decline as well as his UOP.  He is also exhibiting early signs of uremia.  Will plan on initiating HD and follow for any signs of renal recovery.  IR has been consulted for [facility]  placement 2. Metabolic acidosis due to #1 3. Ethylene glycol ingestion and overdose of Aleve- seen by Psych.  Denies any active suicidal ideation. 4. HTN- off of lisinopril/HCTZ due to AKI. 5. Vascular access- s/p RIJ tdc placement 06/23/18 by Dr. Delilah Shan, MD [facility] [phone]

## 2018-06-23 NOTE — Progress Notes (Signed)
[facility] PROGRESS NOTE  Bryan Riggs  ZOX:[id number] DOB: Jun 19, 1966 DOA: 06/20/2018 PCP: Bryan Riggs, L.Bryan Saucer, MD  Brief Narrative: Bryan Riggs is a 52 y.o. male with a history of HTN who presented to the ED 8/7 with multiple complaints including dizziness, fatigue, malaise, nausea, a couple episodes of emesis, loose stools and pain in the right flank/lower back. He additionally admitted to an intentional ingestion of aleve (~10 pills), tylenol, a sleep aid (both unknown), and about 1/2 cup of antifreeze mixed in [place] about 2 weeks prior. This was impulsive, after returning from work that day with a headache, back ache, and severe stress around financial issues with insomnia and depression. He felt embarrassed and remorseful, did not seek medical attention and has continued to go to work, but sought care when multiple symptoms as above worsened. He was found to be in renal failure with creatinine 9.2 (from previous 1.55), BUN 51. Toxicology screen, including tylenol, salicylates, methanol, ethanol, ethylene glycol, was negative. CT renal study showed apparently normal kidneys without hydronephrosis. Urinalysis showed proteinuria, pyuria, and hematuria. Ceftriaxone and IV fluids were administered, poison control consulted, and patient admitted. Psychiatry has evaluated the patient and cleared him for discharge to the appropriate setting. Creatinine has continued to climb and nephrology has recommended initiating hemodialysis. IR consulted for HD catheter placement 8/9.   Subjective: Starting to have swelling in hands and feet/legs. Still nauseated, had some more vomiting. Making urine.  Objective: BP (!) 150/93 (BP Location: Left Arm)   Pulse 79   Temp 98.9 F (37.2 C) (Oral)   Resp 18   Ht 6' (1.829 m)   Wt 120.3 kg   SpO2 96%   BMI 35.97 kg/m   Gen: 52 y.o. male in no distress Pulm: Nonlabored breathing room air. Clear. CV: Regular rate and rhythm. No murmur, rub,  or gallop. No JVD, 1+ edema in hands and legs. GI: Abdomen soft, non-tender, non-distended, with normoactive bowel sounds.  Ext: Warm, no deformities Skin: No rashes, lesions or ulcers on visualized skin.  Neuro: Alert and oriented. No focal neurological deficits. Psych: Judgement and insight appear fair. Mood euthymic & affect congruent. Behavior is appropriate.    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 Has acidosis with anion gap, lactic acid normal. Suspect acidosis is from excessive saline. No current uremia. K wnl. - Holding ACE, avoiding contrast, etc. - Stopped IV fluids, having some swelling - Monitor BMP daily. Strict I/O. - Nephrology consulted, IR consulted for HD cath today, initiation of HD today. - Start bicarbonate for metabolic acidosis. - Discussed with nephrology, may benefit from renal biopsy to delineate prognosis.  Intentional ingestion of toxic agents: 2 weeks PTA, remorseful, feels it was a poor and impulsive decision. Has support systems in place and no previous attempts. INR, LFTs, CK not elevated. - Psychiatry evaluation 8/7, no need for inpatient psychiatry hospitalization.  - Continue supportive measures.  Pyuria: Not overtly symptomatic, no growth on urine culture. Completed 3 days of CTX.  HTN:  - Holding diuretic and ACE due to renal failure.  - Norvasc 10mg , hydralazine, imdur, metoprolol.  Time spent: 25 min.  Tyrone Nine, MD [facility]  [email or web address] Password [email or web address] 06/23/2018, 2:02 PM

## 2018-06-23 NOTE — Procedures (Signed)
Interventional Radiology Procedure Note  Procedure: Tunneled HD catheter placement  Complications: None  Estimated Blood Loss: < 10 mL  Findings: 23 cm tip to cuff length Palindrome catheter placed via right IJ vein.  Tip in RA.  OK to use.  Jodi MarbleGlenn T. Fredia SorrowYamagata, M.D Pager:  [phone]

## 2018-06-23 NOTE — Progress Notes (Signed)
HD tx completed w/o problem,  UF goal met, blood rinsed back, VSS, report called to Tawni Carnes, RN

## 2018-06-23 NOTE — Progress Notes (Signed)
HD tx initiated via HD cath w/o problem, pull/push/flush equally w/o problem, will cont to monitor while on HD tx 

## 2018-06-24 LAB — RENAL FUNCTION PANEL
ALBUMIN: 2.6 g/dL — AB (ref 3.5–5.0)
ANION GAP: 16 — AB (ref 5–15)
BUN: 74 mg/dL — ABNORMAL HIGH (ref 6–20)
CO2: 18 mmol/L — AB (ref 22–32)
Calcium: 7.7 mg/dL — ABNORMAL LOW (ref 8.9–10.3)
Chloride: 103 mmol/L (ref 98–111)
Creatinine, Ser: 14.64 mg/dL — ABNORMAL HIGH (ref 0.61–1.24)
GFR calc Af Amer: 4 mL/min — ABNORMAL LOW (ref >60)
GFR calc non Af Amer: 3 mL/min — ABNORMAL LOW (ref >60)
GLUCOSE: 99 mg/dL (ref 70–99)
PHOSPHORUS: 6.6 mg/dL — AB (ref 2.5–4.6)
POTASSIUM: 3.1 mmol/L — AB (ref 3.5–5.1)
Sodium: 137 mmol/L (ref 135–145)

## 2018-06-24 LAB — HEPATITIS B SURFACE ANTIGEN: HEP B S AG: NEGATIVE

## 2018-06-24 NOTE — Progress Notes (Signed)
RN spoke with Bryan BryantMaryann McEachern, a nurse with  [facility] in regards to patients most up to date labs and condition. Pt stable at this time. Will continue to monitor.

## 2018-06-24 NOTE — Progress Notes (Signed)
S: Feels better after HD yesterday and tolerated it well O:BP (!) 152/100 (BP Location: Left Arm)   Pulse 76   Temp 97.9 F (36.6 C) (Oral)   Resp 20   Ht 6' (1.829 m)   Wt 120.8 kg   SpO2 95%   BMI 36.12 kg/m   Intake/Output Summary (Last 24 hours) at 06/24/2018 1202 Last data filed at 06/24/2018 0826 Gross per 24 hour  Intake 955.77 ml  Output 1430 ml  Net -474.23 ml   Intake/Output: I/O last 3 completed shifts: In: 274.2 [I.V.:274.2] Out: 1755 [Urine:750; Other:1005]  Intake/Output this shift:  Total I/O In: 684.5 [I.V.:684.5] Out: 225 [Urine:225] Weight change: 1.4 kg Gen:NAD CVS: no rub Resp: cta Abd: benign Ext: 1+ edema  Recent Labs  Lab 06/20/18 1750 06/21/18 1128 06/22/18 0532 06/23/18 0512 06/23/18 2100 06/24/18 0431  NA 141 140 139 140 138 137  K 3.0* 3.4* 3.8 3.7 3.4* 3.1*  CL 103 103 104 103 102 103  CO2 21* 22 16* 16* 17* 18*  GLUCOSE 95 122* 82 84 116* 99  BUN 51* 57* 68* 84* 91* 74*  CREATININE 9.20* 10.92* 12.39* 15.53* 16.52* 14.64*  ALBUMIN 3.6  --   --  2.9* 2.8* 2.6*  CALCIUM 8.6* 8.0* 7.8* 8.1* 7.8* 7.7*  PHOS  --   --   --  9.1* 9.2* 6.6*  AST 24  --   --   --   --   --   ALT 27  --   --   --  20  --    Liver Function Tests: Recent Labs  Lab 06/20/18 1750 06/23/18 0512 06/23/18 2100 06/24/18 0431  AST 24  --   --   --   ALT 27  --  20  --   ALKPHOS 76  --   --   --   BILITOT 1.4*  --   --   --   PROT 6.7  --   --   --   ALBUMIN 3.6 2.9* 2.8* 2.6*   Recent Labs  Lab 06/20/18 1750  LIPASE 31   No results for input(s): AMMONIA in the last 168 hours. CBC: Recent Labs  Lab 06/20/18 1750 06/21/18 1128 06/22/18 0532 06/23/18 2100  WBC 7.8 6.8 7.4 5.4  NEUTROABS 5.4  --   --   --   HGB 17.2* 15.9 15.6 15.7  HCT 51.1 46.3 46.6 45.7  MCV 89.2 87.4 90.0 87.9  PLT 198 173 195 219   Cardiac Enzymes: Recent Labs  Lab 06/21/18 0335  CKTOTAL 278   CBG: No results for input(s): GLUCAP in the last 168 hours.  Iron  Studies: No results for input(s): IRON, TIBC, TRANSFERRIN, FERRITIN in the last 72 hours. Studies/Results: Ir Fluoro Guide Cv Line Right  Result Date: 06/23/2018 CLINICAL DATA:  Acute renal failure requiring hemodialysis. EXAM: TUNNELED CENTRAL VENOUS HEMODIALYSIS CATHETER PLACEMENT WITH ULTRASOUND AND FLUOROSCOPIC GUIDANCE ANESTHESIA/SEDATION: 1.5 mg IV Versed; 75 mcg IV Fentanyl. Total Moderate Sedation Time:   25 minutes. The patient's level of consciousness and physiologic status were continuously monitored during the procedure by Radiology nursing. MEDICATIONS: 2 g IV Ancef. FLUOROSCOPY TIME:  30 seconds.  6.6 mGy. PROCEDURE: The procedure, risks, benefits, and alternatives were explained to the patient. Questions regarding the procedure were encouraged and answered. The patient understands and consents to the procedure. A timeout was performed prior to initiating the procedure. The right neck and chest were prepped with chlorhexidine in a sterile fashion, and  a sterile drape was applied covering the operative field. Maximum barrier sterile technique with sterile gowns and gloves were used for the procedure. Local anesthesia was provided with 1% lidocaine. Ultrasound was used to confirm patency of the right internal jugular vein. After creating a small venotomy incision, a 21 gauge needle was advanced into the right internal jugular vein under direct, real-time ultrasound guidance. Ultrasound image documentation was performed. After securing guidewire access, an 8 Fr dilator was placed. A J-wire was kinked to measure appropriate catheter length. A Palindrome tunneled hemodialysis catheter measuring 23 cm from tip to cuff was chosen for placement. This was tunneled in a retrograde fashion from the chest wall to the venotomy incision. At the venotomy, serial dilatation was performed and a 16 Fr peel-away sheath was placed over a guidewire. The catheter was then placed through the sheath and the sheath removed.  Final catheter positioning was confirmed and documented with a fluoroscopic spot image. The catheter was aspirated, flushed with saline, and injected with appropriate volume heparin dwells. The venotomy incision was closed with subcuticular 4-0 Vicryl. Dermabond was applied to the incision. The catheter exit site was secured with 0-Prolene retention sutures. COMPLICATIONS: None.  No pneumothorax. FINDINGS: After catheter placement, the tip lies in the right atrium. The catheter aspirates normally and is ready for immediate use. IMPRESSION: Placement of tunneled hemodialysis catheter via the right internal jugular vein. The catheter tip lies in the right atrium. The catheter is ready for immediate use. Electronically Signed   By: Irish Lack M.D.   On: 06/23/2018 17:23   Ir US Guide Vasc Access Right  Result Date: 06/23/2018 CLINICAL DATA:  Acute renal failure requiring hemodialysis. EXAM: TUNNELED CENTRAL VENOUS HEMODIALYSIS CATHETER PLACEMENT WITH ULTRASOUND AND FLUOROSCOPIC GUIDANCE ANESTHESIA/SEDATION: 1.5 mg IV Versed; 75 mcg IV Fentanyl. Total Moderate Sedation Time:   25 minutes. The patient's level of consciousness and physiologic status were continuously monitored during the procedure by Radiology nursing. MEDICATIONS: 2 g IV Ancef. FLUOROSCOPY TIME:  30 seconds.  6.6 mGy. PROCEDURE: The procedure, risks, benefits, and alternatives were explained to the patient. Questions regarding the procedure were encouraged and answered. The patient understands and consents to the procedure. A timeout was performed prior to initiating the procedure. The right neck and chest were prepped with chlorhexidine in a sterile fashion, and a sterile drape was applied covering the operative field. Maximum barrier sterile technique with sterile gowns and gloves were used for the procedure. Local anesthesia was provided with 1% lidocaine. Ultrasound was used to confirm patency of the right internal jugular vein. After  creating a small venotomy incision, a 21 gauge needle was advanced into the right internal jugular vein under direct, real-time ultrasound guidance. Ultrasound image documentation was performed. After securing guidewire access, an 8 Fr dilator was placed. A J-wire was kinked to measure appropriate catheter length. A Palindrome tunneled hemodialysis catheter measuring 23 cm from tip to cuff was chosen for placement. This was tunneled in a retrograde fashion from the chest wall to the venotomy incision. At the venotomy, serial dilatation was performed and a 16 Fr peel-away sheath was placed over a guidewire. The catheter was then placed through the sheath and the sheath removed. Final catheter positioning was confirmed and documented with a fluoroscopic spot image. The catheter was aspirated, flushed with saline, and injected with appropriate volume heparin dwells. The venotomy incision was closed with subcuticular 4-0 Vicryl. Dermabond was applied to the incision. The catheter exit site was secured with 0-Prolene retention  sutures. COMPLICATIONS: None.  No pneumothorax. FINDINGS: After catheter placement, the tip lies in the right atrium. The catheter aspirates normally and is ready for immediate use. IMPRESSION: Placement of tunneled hemodialysis catheter via the right internal jugular vein. The catheter tip lies in the right atrium. The catheter is ready for immediate use. Electronically Signed   By: Irish LackGlenn  Yamagata M.D.   On: 06/23/2018 17:23   . amLODipine  10 mg Oral Daily  . Chlorhexidine Gluconate Cloth  6 each Topical Q0600  . heparin  5,000 Units Subcutaneous Q8H  . hydrALAZINE  50 mg Oral TID  . isosorbide mononitrate  30 mg Oral Daily  . metoprolol succinate  100 mg Oral Daily  . sodium chloride flush  3 mL Intravenous Q12H    BMET    Component Value Date/Time   NA 137 06/24/2018 0431   K 3.1 (L) 06/24/2018 0431   CL 103 06/24/2018 0431   CO2 18 (L) 06/24/2018 0431   GLUCOSE 99 06/24/2018  0431   BUN 74 (H) 06/24/2018 0431   CREATININE 14.64 (H) 06/24/2018 0431   CREATININE 1.55 (H) 12/01/2015 1442   CALCIUM 7.7 (L) 06/24/2018 0431   GFRNONAA 3 (L) 06/24/2018 0431   GFRAA 4 (L) 06/24/2018 0431   CBC    Component Value Date/Time   WBC 5.4 06/23/2018 2100   RBC 5.20 06/23/2018 2100   HGB 15.7 06/23/2018 2100   HCT 45.7 06/23/2018 2100   PLT 219 06/23/2018 2100   MCV 87.9 06/23/2018 2100   MCH 30.2 06/23/2018 2100   MCHC 34.4 06/23/2018 2100   RDW 13.1 06/23/2018 2100   LYMPHSABS 1.3 06/20/2018 1750   MONOABS 1.0 06/20/2018 1750   EOSABS 0.1 06/20/2018 1750   BASOSABS 0.0 06/20/2018 1750     52Mwith acute kidney injury, nonoliguric. Etiology is unclear but likely related to heavy NSAID use, ongoing lisinopril, at the same time as ingesting a small amount of ethylene glycol. Is no longer present in the system, having been metabolized. No role for fomepizole or hemodialysis to treat toxic ingestion. Patient has developed a worsening acidosis, likely explained by his ongoing normal saline administration and impaired ammoniagenesis. No uremic symptoms currently.  Assessment/Plan:  1. AKI/CKD stage 3, nonoliguric- likely due to ATN from ACE- inhibitors + NSAIds, however also very concerning for irreversible damage from ethylene glycol ingestion a week ago.  His renal function has continued to decline as well as his UOP.  He is also exhibiting early signs of uremia.  Will plan on initiating HD and follow for any signs of renal recovery.  appreciate IR assistance with [facility]DC placement.  Plan for HD again today and follow UOP and Scr.  2. Metabolic acidosis due to #1 improved with HD 3. Ethylene glycol ingestion and overdose of Aleve- seen by Psych.  Denies any active suicidal ideation. 4. HTN- off of lisinopril/HCTZ due to AKI. 5. Vascular access- s/p RIJ tdc placement 06/23/18 by Dr. Delilah ShanYamagata   Kenisha Lynds A. Donis Kotowski, MD [facility] [phone]

## 2018-06-24 NOTE — Progress Notes (Signed)
[facility] PROGRESS NOTE  Bryan Riggs  GNF:[id number] DOB: 1966-05-27 DOA: 06/20/2018 PCP: Clovis Riley, L.August Saucer, MD  Brief Narrative: Bryan Riggs is a 52 y.o. male with a history of HTN who presented to the ED 8/7 with multiple complaints including dizziness, fatigue, malaise, nausea, a couple episodes of emesis, loose stools and pain in the right flank/lower back. He additionally admitted to an intentional ingestion of aleve (~10 pills), tylenol, a sleep aid (both unknown), and about 1/2 cup of antifreeze mixed in [place] about 2 weeks prior. This was impulsive, after returning from work that day with a headache, back ache, and severe stress around financial issues with insomnia and depression. He felt embarrassed and remorseful, did not seek medical attention and has continued to go to work, but sought care when multiple symptoms as above worsened. He was found to be in renal failure with creatinine 9.2 (from previous 1.55), BUN 51. Toxicology screen, including tylenol, salicylates, methanol, ethanol, ethylene glycol, was negative. CT renal study showed apparently normal kidneys without hydronephrosis. Urinalysis showed proteinuria, pyuria, and hematuria. Ceftriaxone and IV fluids were administered, poison control consulted, and patient admitted. Psychiatry has evaluated the patient and cleared him for discharge to the appropriate setting. Creatinine has continued to climb and nephrology has recommended initiating hemodialysis. IR consulted for HD catheter placement 8/9 and started HD 8/9, 8/10.   Subjective: Had some abdominal pain with nausea and vomiting today, tolerated HD ok. No testicle pain today. Still making urine. No fevers.  Objective: BP 126/84 (BP Location: Left Arm)   Pulse 80   Temp 98.8 F (37.1 C) (Oral)   Resp 20   Ht 6' (1.829 m)   Wt 120.8 kg   SpO2 95%   BMI 36.12 kg/m   Gen: 52 y.o. male in no distress Pulm: Nonlabored breathing room air.  Clear. CV: Regular rate and rhythm. No murmur, rub, or gallop. No JVD, no dependent edema. GI: Abdomen soft, non-tender, non-distended, with normoactive bowel sounds.  Ext: Warm, no deformities Skin: Right IJ TDC site appropriately mildly tender without erythema, discharge. Neuro: Alert and oriented. No asterixis or focal neurological deficits. Psych: Judgement and insight appear fair. Mood euthymic & affect congruent. Behavior is appropriate.    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 s/p TDC right IJ 8/9, started HD 8/9, again 8/10. - Holding ACE, avoiding contrast, etc. - Monitor BMP daily. Strict I/O. - Nephrology consulted, repeat HD today. - Continue bicarbonate for metabolic acidosis. - Discussed with nephrology, ?may benefit from renal biopsy to delineate prognosis.  Intentional ingestion of toxic agents: 2 weeks PTA, remorseful, feels it was a poor and impulsive decision. Has support systems in place and no previous attempts. INR, LFTs, CK not elevated. Has acidosis with anion gap, lactic acid normal.  - Psychiatry evaluation 8/7, no need for inpatient psychiatry hospitalization.  - Continue supportive measures.  Pyuria: Not overtly symptomatic, no growth on urine culture. Completed 3 days of CTX.  HTN:  - Holding diuretic and ACE due to renal failure.  - Norvasc 10mg , hydralazine, imdur, metoprolol. - Manage with HD.  Time spent: 25 min.  Tyrone Nine, MD [facility] [email or web address]  Password [email or web address] 06/24/2018, 4:22 PM

## 2018-06-25 LAB — RENAL FUNCTION PANEL
ALBUMIN: 2.7 g/dL — AB (ref 3.5–5.0)
ALBUMIN: 2.7 g/dL — AB (ref 3.5–5.0)
ANION GAP: 14 (ref 5–15)
Anion gap: 18 — ABNORMAL HIGH (ref 5–15)
BUN: 82 mg/dL — ABNORMAL HIGH (ref 6–20)
BUN: 52 mg/dL — ABNORMAL HIGH (ref 6–20)
CALCIUM: 7.6 mg/dL — AB (ref 8.9–10.3)
CALCIUM: 7.7 mg/dL — AB (ref 8.9–10.3)
CO2: 22 mmol/L (ref 22–32)
CO2: 24 mmol/L (ref 22–32)
CREATININE: 11.87 mg/dL — AB (ref 0.61–1.24)
CREATININE: 15.47 mg/dL — AB (ref 0.61–1.24)
Chloride: 97 mmol/L — ABNORMAL LOW (ref 98–111)
Chloride: 100 mmol/L (ref 98–111)
GFR calc non Af Amer: 3 mL/min — ABNORMAL LOW (ref >60)
GFR calc non Af Amer: 4 mL/min — ABNORMAL LOW (ref >60)
GFR, EST AFRICAN AMERICAN: 4 mL/min — AB (ref >60)
GFR, EST AFRICAN AMERICAN: 5 mL/min — AB (ref >60)
GLUCOSE: 101 mg/dL — AB (ref 70–99)
Glucose, Bld: 134 mg/dL — ABNORMAL HIGH (ref 70–99)
PHOSPHORUS: 6.4 mg/dL — AB (ref 2.5–4.6)
Phosphorus: 4.7 mg/dL — ABNORMAL HIGH (ref 2.5–4.6)
Potassium: 2.8 mmol/L — ABNORMAL LOW (ref 3.5–5.1)
Potassium: 2.9 mmol/L — ABNORMAL LOW (ref 3.5–5.1)
Sodium: 137 mmol/L (ref 135–145)
Sodium: 138 mmol/L (ref 135–145)

## 2018-06-25 LAB — CBC
HCT: 42.1 % (ref 39.0–52.0)
HEMOGLOBIN: 14.7 g/dL (ref 13.0–17.0)
MCH: 30.2 pg (ref 26.0–34.0)
MCHC: 34.9 g/dL (ref 30.0–36.0)
MCV: 86.4 fL (ref 78.0–100.0)
Platelets: 188 10*3/uL (ref 150–400)
RBC: 4.87 MIL/uL (ref 4.22–5.81)
RDW: 12.7 % (ref 11.5–15.5)
WBC: 5.1 10*3/uL (ref 4.0–10.5)

## 2018-06-25 LAB — HEPATITIS B CORE ANTIBODY, TOTAL: HEP B C TOTAL AB: NEGATIVE

## 2018-06-25 LAB — HEPATITIS B SURFACE ANTIBODY,QUALITATIVE: Hep B S Ab: NONREACTIVE

## 2018-06-25 MED ORDER — ISOSORBIDE MONONITRATE ER 60 MG PO TB24
60.0000 mg | ORAL_TABLET | Freq: Every day | ORAL | Status: DC
  Administered 2018-06-26 – 2018-06-30 (×5): 60 mg via ORAL
  Filled 2018-06-25 (×5): qty 1

## 2018-06-25 MED ORDER — HYDROMORPHONE HCL 2 MG PO TABS
1.0000 mg | ORAL_TABLET | ORAL | Status: DC | PRN
  Filled 2018-06-25: qty 1

## 2018-06-25 MED ORDER — POTASSIUM CHLORIDE CRYS ER 20 MEQ PO TBCR
40.0000 meq | EXTENDED_RELEASE_TABLET | ORAL | Status: AC
  Administered 2018-06-25 (×2): 40 meq via ORAL
  Filled 2018-06-25 (×2): qty 2

## 2018-06-25 MED ORDER — POTASSIUM CHLORIDE CRYS ER 20 MEQ PO TBCR
40.0000 meq | EXTENDED_RELEASE_TABLET | Freq: Two times a day (BID) | ORAL | Status: AC
  Administered 2018-06-25 – 2018-06-26 (×4): 40 meq via ORAL
  Filled 2018-06-25 (×4): qty 2

## 2018-06-25 NOTE — Progress Notes (Signed)
Received a call from HD stating pt was supposed to be saline locked due to pt already getting bicarb in HD. HD RN stated she was going to take care of it. This RN stated that was fine.   Larey Dayshristy M Verity Gilcrest, RN

## 2018-06-25 NOTE — Progress Notes (Signed)
HD tx initiated via HD cath w/o problem, AP: pull/push/flush well, VP: sluggish pull, push/flush well, VSS, will cont to monitor while on HD tx 

## 2018-06-25 NOTE — Progress Notes (Signed)
[facility] PROGRESS NOTE  Bryan Riggs  ZOX:[id number] DOB: [birth year] DOA: 06/20/2018 PCP: Clovis Riley, L.August Saucer, MD  Brief Narrative: Bryan Riggs is a 52 y.o. male with a history of HTN who presented to the ED 8/7 with multiple complaints including dizziness, fatigue, malaise, nausea, a couple episodes of emesis, loose stools and pain in the right flank/lower back. He additionally admitted to an intentional ingestion of aleve (~10 pills), tylenol, a sleep aid (both unknown), and about 1/2 cup of antifreeze mixed in [place] about 2 weeks prior. This was impulsive, after returning from work that day with a headache, back ache, and severe stress around financial issues with insomnia and depression. He felt embarrassed and remorseful, did not seek medical attention and has continued to go to work, but sought care when multiple symptoms as above worsened. He was found to be in renal failure with creatinine 9.2 (from previous 1.55), BUN 51. Toxicology screen, including tylenol, salicylates, methanol, ethanol, ethylene glycol, was negative. CT renal study showed apparently normal kidneys without hydronephrosis. Urinalysis showed proteinuria, pyuria, and hematuria. Ceftriaxone and IV fluids were administered, poison control consulted, and patient admitted. Psychiatry has evaluated the patient and cleared him for discharge to the appropriate setting. Creatinine has continued to climb and nephrology has recommended initiating hemodialysis. IR consulted for HD catheter placement 8/9 and started HD 8/9, 8/10.   Subjective: Still nauseated with emesis, feels he's urinating more. No testicular pain. Abdomen hurts worst when they inject heparin.  Objective: BP (!) 137/94 (BP Location: Left Arm)   Pulse 85   Temp 97.8 F (36.6 C) (Oral)   Resp 18   Ht 6' (1.829 m)   Wt 121.8 kg   SpO2 97%   BMI 36.42 kg/m   Gen: 52 y.o. male in no distress Pulm: Nonlabored breathing room air.  Clear. CV: Regular rate and rhythm. No murmur, rub, or gallop. No JVD, + dependent edema. GI: Abdomen soft, non-tender, non-distended, with normoactive bowel sounds.  Ext: Warm, no deformities Skin: Some ecchymoses on abdomen lower, scattered where he's gotten heparin injections, reportedly tender without fluctuance. Otherwise no rashes, lesions or ulcers on visualized skin. Right IJ TDC without erythema/discharge. Neuro: Alert and oriented. No asterixis, or focal neurological deficits. Psych: Judgement and insight appear fair. Mood depressed with broad, congruent affect, tearful at times.    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 s/p TDC right IJ 8/9, started HD 8/9, again 8/10. - Holding ACE, avoiding contrast, etc. - Monitoring renal function panel, UOP daily. Planning repeat HD 8/12.  Metabolic acidosis: Due to renal failure.  - Resolved with bicarb, will stop this gtt as he's starting to have swelling.   Hypokalemia: supplement this AM, will defer further supp to nephrology.  Intentional ingestion of toxic agents: 2 weeks PTA, remorseful, feels it was a poor and impulsive decision. Has support systems in place and no previous attempts. INR, LFTs, CK not elevated. Has acidosis with anion gap, lactic acid normal.  - Psychiatry evaluation 8/7, no need for inpatient psychiatry hospitalization.  - Continue supportive measures.  Pyuria: Not overtly symptomatic, no growth on urine culture. Completed 3 days of CTX.  HTN:  -  Holding diuretic and ACE due to renal failure.  - Norvasc 10mg , hydralazine, imdur, metoprolol. Remains uncontrolled but don't want  to limit HD by dropping too low. Will increase imdur 30mg  > 60mg  qd. - Manage with HD.  Heparin-induced ecchymoses: No evidnece of significant hemorrhage.  - Start SCDs in lieu of pharmacologic DVT ppx. Pt is quite ambulatory and encouraged this to continue for DVT ppx, general mental and physical health.  Time spent: 25 min.  Tyrone Nineyan B Shelsy Seng, MD [facility] [email or web address] Password [email or web address] 06/25/2018, 3:25 PM

## 2018-06-25 NOTE — Progress Notes (Signed)
Called primary nurse Alfonse Rashristy Davis, RN to make her aware of pre tx K+ = 2.8. Let her know that he was on a 4K+ bath yesterday and today but K+ has trended down since admit. pt continues to be on a sodium bicarb drip. Asked her if she could follow up w/ primary MD/team to address these issues.

## 2018-06-25 NOTE — Progress Notes (Signed)
Pt's Potassium is 2.8. This RN was told by HD RN that it could be bicarb gtt that is lowering Potassium. This RN messaged MD on call to make aware. Pt does not want anymore fluids until pt speaks with MD to make sure it is okay. This RN made MD aware.   Larey Dayshristy M Bristyl Mclees, RN

## 2018-06-25 NOTE — Progress Notes (Signed)
S: Feels better after HD but was late last night and is tired. O:BP (!) 150/83 (BP Location: Left Arm)   Pulse 83   Temp 98.1 F (36.7 C) (Oral)   Resp 20   Ht 6' (1.829 m)   Wt 121.8 kg   SpO2 96%   BMI 36.42 kg/m   Intake/Output Summary (Last 24 hours) at 06/25/2018 1200 Last data filed at 06/25/2018 1112 Gross per 24 hour  Intake 1668.57 ml  Output 1657 ml  Net 11.57 ml   Intake/Output: I/O last 3 completed shifts: In: 1849 [P.O.:600; I.V.:1249] Out: 2737 [Urine:725; Other:2012]  Intake/Output this shift:  Total I/O In: 744.1 [P.O.:120; I.V.:624.1] Out: 350 [Urine:350] Weight change: 1.1 kg Gen: NAD CVS: no rub Resp: cta Abd: benign Ext: 1+ edema  Recent Labs  Lab 06/20/18 1750 06/21/18 1128 06/22/18 0532 06/23/18 0512 06/23/18 2100 06/24/18 0431 06/25/18 0030 06/25/18 0623  NA 141 140 139 140 138 137 137 138  K 3.0* 3.4* 3.8 3.7 3.4* 3.1* 2.8* 2.9*  CL 103 103 104 103 102 103 97* 100  CO2 21* 22 16* 16* 17* 18* 22 24  GLUCOSE 95 122* 82 84 116* 99 101* 134*  BUN 51* 57* 68* 84* 91* 74* 82* 52*  CREATININE 9.20* 10.92* 12.39* 15.53* 16.52* 14.64* 15.47* 11.87*  ALBUMIN 3.6  --   --  2.9* 2.8* 2.6* 2.7* 2.7*  CALCIUM 8.6* 8.0* 7.8* 8.1* 7.8* 7.7* 7.6* 7.7*  PHOS  --   --   --  9.1* 9.2* 6.6* 6.4* 4.7*  AST 24  --   --   --   --   --   --   --   ALT 27  --   --   --  20  --   --   --    Liver Function Tests: Recent Labs  Lab 06/20/18 1750  06/23/18 2100 06/24/18 0431 06/25/18 0030 06/25/18 0623  AST 24  --   --   --   --   --   ALT 27  --  20  --   --   --   ALKPHOS 76  --   --   --   --   --   BILITOT 1.4*  --   --   --   --   --   PROT 6.7  --   --   --   --   --   ALBUMIN 3.6   < > 2.8* 2.6* 2.7* 2.7*   < > = values in this interval not displayed.   Recent Labs  Lab 06/20/18 1750  LIPASE 31   No results for input(s): AMMONIA in the last 168 hours. CBC: Recent Labs  Lab 06/20/18 1750 06/21/18 1128 06/22/18 0532 06/23/18 2100  06/25/18 0030  WBC 7.8 6.8 7.4 5.4 5.1  NEUTROABS 5.4  --   --   --   --   HGB 17.2* 15.9 15.6 15.7 14.7  HCT 51.1 46.3 46.6 45.7 42.1  MCV 89.2 87.4 90.0 87.9 86.4  PLT 198 173 195 219 188   Cardiac Enzymes: Recent Labs  Lab 06/21/18 0335  CKTOTAL 278   CBG: No results for input(s): GLUCAP in the last 168 hours.  Iron Studies: No results for input(s): IRON, TIBC, TRANSFERRIN, FERRITIN in the last 72 hours. Studies/Results: Ir Fluoro Guide Cv Line Right  Result Date: 06/23/2018 CLINICAL DATA:  Acute renal failure requiring hemodialysis. EXAM: TUNNELED CENTRAL VENOUS HEMODIALYSIS CATHETER PLACEMENT WITH ULTRASOUND  AND FLUOROSCOPIC GUIDANCE ANESTHESIA/SEDATION: 1.5 mg IV Versed; 75 mcg IV Fentanyl. Total Moderate Sedation Time:   25 minutes. The patient's level of consciousness and physiologic status were continuously monitored during the procedure by Radiology nursing. MEDICATIONS: 2 g IV Ancef. FLUOROSCOPY TIME:  30 seconds.  6.6 mGy. PROCEDURE: The procedure, risks, benefits, and alternatives were explained to the patient. Questions regarding the procedure were encouraged and answered. The patient understands and consents to the procedure. A timeout was performed prior to initiating the procedure. The right neck and chest were prepped with chlorhexidine in a sterile fashion, and a sterile drape was applied covering the operative field. Maximum barrier sterile technique with sterile gowns and gloves were used for the procedure. Local anesthesia was provided with 1% lidocaine. Ultrasound was used to confirm patency of the right internal jugular vein. After creating a small venotomy incision, a 21 gauge needle was advanced into the right internal jugular vein under direct, real-time ultrasound guidance. Ultrasound image documentation was performed. After securing guidewire access, an 8 Fr dilator was placed. A J-wire was kinked to measure appropriate catheter length. A Palindrome tunneled  hemodialysis catheter measuring 23 cm from tip to cuff was chosen for placement. This was tunneled in a retrograde fashion from the chest wall to the venotomy incision. At the venotomy, serial dilatation was performed and a 16 Fr peel-away sheath was placed over a guidewire. The catheter was then placed through the sheath and the sheath removed. Final catheter positioning was confirmed and documented with a fluoroscopic spot image. The catheter was aspirated, flushed with saline, and injected with appropriate volume heparin dwells. The venotomy incision was closed with subcuticular 4-0 Vicryl. Dermabond was applied to the incision. The catheter exit site was secured with 0-Prolene retention sutures. COMPLICATIONS: None.  No pneumothorax. FINDINGS: After catheter placement, the tip lies in the right atrium. The catheter aspirates normally and is ready for immediate use. IMPRESSION: Placement of tunneled hemodialysis catheter via the right internal jugular vein. The catheter tip lies in the right atrium. The catheter is ready for immediate use. Electronically Signed   By: Irish Lack M.D.   On: 06/23/2018 17:23   Ir US Guide Vasc Access Right  Result Date: 06/23/2018 CLINICAL DATA:  Acute renal failure requiring hemodialysis. EXAM: TUNNELED CENTRAL VENOUS HEMODIALYSIS CATHETER PLACEMENT WITH ULTRASOUND AND FLUOROSCOPIC GUIDANCE ANESTHESIA/SEDATION: 1.5 mg IV Versed; 75 mcg IV Fentanyl. Total Moderate Sedation Time:   25 minutes. The patient's level of consciousness and physiologic status were continuously monitored during the procedure by Radiology nursing. MEDICATIONS: 2 g IV Ancef. FLUOROSCOPY TIME:  30 seconds.  6.6 mGy. PROCEDURE: The procedure, risks, benefits, and alternatives were explained to the patient. Questions regarding the procedure were encouraged and answered. The patient understands and consents to the procedure. A timeout was performed prior to initiating the procedure. The right neck and  chest were prepped with chlorhexidine in a sterile fashion, and a sterile drape was applied covering the operative field. Maximum barrier sterile technique with sterile gowns and gloves were used for the procedure. Local anesthesia was provided with 1% lidocaine. Ultrasound was used to confirm patency of the right internal jugular vein. After creating a small venotomy incision, a 21 gauge needle was advanced into the right internal jugular vein under direct, real-time ultrasound guidance. Ultrasound image documentation was performed. After securing guidewire access, an 8 Fr dilator was placed. A J-wire was kinked to measure appropriate catheter length. A Palindrome tunneled hemodialysis catheter measuring 23 cm from tip  to cuff was chosen for placement. This was tunneled in a retrograde fashion from the chest wall to the venotomy incision. At the venotomy, serial dilatation was performed and a 16 Fr peel-away sheath was placed over a guidewire. The catheter was then placed through the sheath and the sheath removed. Final catheter positioning was confirmed and documented with a fluoroscopic spot image. The catheter was aspirated, flushed with saline, and injected with appropriate volume heparin dwells. The venotomy incision was closed with subcuticular 4-0 Vicryl. Dermabond was applied to the incision. The catheter exit site was secured with 0-Prolene retention sutures. COMPLICATIONS: None.  No pneumothorax. FINDINGS: After catheter placement, the tip lies in the right atrium. The catheter aspirates normally and is ready for immediate use. IMPRESSION: Placement of tunneled hemodialysis catheter via the right internal jugular vein. The catheter tip lies in the right atrium. The catheter is ready for immediate use. Electronically Signed   By: Irish LackGlenn  Yamagata M.D.   On: 06/23/2018 17:23   . amLODipine  10 mg Oral Daily  . Chlorhexidine Gluconate Cloth  6 each Topical Q0600  . hydrALAZINE  50 mg Oral TID  .  isosorbide mononitrate  30 mg Oral Daily  . metoprolol succinate  100 mg Oral Daily  . sodium chloride flush  3 mL Intravenous Q12H    BMET    Component Value Date/Time   NA 138 06/25/2018 0623   K 2.9 (L) 06/25/2018 0623   CL 100 06/25/2018 0623   CO2 24 06/25/2018 0623   GLUCOSE 134 (H) 06/25/2018 0623   BUN 52 (H) 06/25/2018 0623   CREATININE 11.87 (H) 06/25/2018 0623   CREATININE 1.55 (H) 12/01/2015 1442   CALCIUM 7.7 (L) 06/25/2018 0623   GFRNONAA 4 (L) 06/25/2018 0623   GFRAA 5 (L) 06/25/2018 0623   CBC    Component Value Date/Time   WBC 5.1 06/25/2018 0030   RBC 4.87 06/25/2018 0030   HGB 14.7 06/25/2018 0030   HCT 42.1 06/25/2018 0030   PLT 188 06/25/2018 0030   MCV 86.4 06/25/2018 0030   MCH 30.2 06/25/2018 0030   MCHC 34.9 06/25/2018 0030   RDW 12.7 06/25/2018 0030   LYMPHSABS 1.3 06/20/2018 1750   MONOABS 1.0 06/20/2018 1750   EOSABS 0.1 06/20/2018 1750   BASOSABS 0.0 06/20/2018 1750     52Mwith acute kidney injury, nonoliguric. Etiology is unclear but likely related to heavy NSAID use, ongoing lisinopril, at the same time as ingesting a small amount of ethylene glycol. Is no longer present in the system, having been metabolized. No role for fomepizole or hemodialysis to treat toxic ingestion. Patient has developed a worsening acidosis, likely explained by his ongoing normal saline administration and impaired ammoniagenesis. No uremic symptoms currently.  Assessment/Plan:  1. AKI/CKD stage 3, nonoliguric- likely due to ATN from ACE- inhibitors + NSAIds, however also very concerning for irreversible damage from ethylene glycol ingestion a week ago. His renal function has continued to decline as well as his UOP. He is also exhibiting early signs of uremia.  1. Initiated HD on 8/9 and again on 8/10. 2. Starting to show some signs of renal recovery with increased UOP.  [facility]. Plan for HD again  tomorrow and follow UOP and Scr.  2. Metabolic acidosis due to #1 improved with HD 3. Ethylene glycol ingestion and overdose of Aleve- seen by Psych. Denies any active suicidal ideation. 4. HTN- off of lisinopril/HCTZ due to AKI. 5. Vascular  access- s/p RIJ tdc placement 06/23/18 by Dr. Fredia Sorrow 6. Hypokalemia- will replete and follow.    Irena Cords, MD [employer] [phone]

## 2018-06-25 NOTE — Progress Notes (Signed)
HD tx completed w/o problem, UF goal met, blood rinsed back, VSS, report called to Perry Mount, RN

## 2018-06-26 LAB — RENAL FUNCTION PANEL
ALBUMIN: 3 g/dL — AB (ref 3.5–5.0)
ANION GAP: 14 (ref 5–15)
BUN: 67 mg/dL — AB (ref 6–20)
CALCIUM: 8.3 mg/dL — AB (ref 8.9–10.3)
CO2: 23 mmol/L (ref 22–32)
Chloride: 104 mmol/L (ref 98–111)
Creatinine, Ser: 13.11 mg/dL — ABNORMAL HIGH (ref 0.61–1.24)
GFR calc Af Amer: 4 mL/min — ABNORMAL LOW (ref >60)
GFR, EST NON AFRICAN AMERICAN: 4 mL/min — AB (ref >60)
GLUCOSE: 83 mg/dL (ref 70–99)
PHOSPHORUS: 4.7 mg/dL — AB (ref 2.5–4.6)
POTASSIUM: 3.6 mmol/L (ref 3.5–5.1)
Sodium: 141 mmol/L (ref 135–145)

## 2018-06-26 LAB — CBC
HEMATOCRIT: 44.6 % (ref 39.0–52.0)
HEMOGLOBIN: 14.8 g/dL (ref 13.0–17.0)
MCH: 29.8 pg (ref 26.0–34.0)
MCHC: 33.2 g/dL (ref 30.0–36.0)
MCV: 89.9 fL (ref 78.0–100.0)
Platelets: 216 10*3/uL (ref 150–400)
RBC: 4.96 MIL/uL (ref 4.22–5.81)
RDW: 13.1 % (ref 11.5–15.5)
WBC: 5.1 10*3/uL (ref 4.0–10.5)

## 2018-06-26 MED ORDER — HEPARIN SODIUM (PORCINE) 1000 UNIT/ML DIALYSIS: 20.0000 [IU]/kg | INTRAMUSCULAR | Status: DC | PRN

## 2018-06-26 NOTE — Progress Notes (Signed)
Subjective: Interval History: has complaints making more urine.  Objective: Vital signs in last 24 hours: Temp:  [97.8 F (36.6 C)-99.9 F (37.7 C)] 98 F (36.7 C) (08/12 0828) Pulse Rate:  [75-85] 75 (08/12 0828) Resp:  [18-20] 20 (08/12 0828) BP: (134-153)/(86-99) 153/99 (08/12 0828) SpO2:  [96 %-97 %] 97 % (08/12 0828) Weight:  [121.8 kg] 121.8 kg (08/12 0500) Weight change: -1 kg  Intake/Output from previous day: 08/11 0701 - 08/12 0700 In: 1104.1 [P.O.:480; I.V.:624.1] Out: 350 [Urine:350] Intake/Output this shift: Total I/O In: 540 [P.O.:540] Out: 800 [Urine:800]  General appearance: alert, cooperative, no distress and mildly obese Resp: clear to auscultation bilaterally Chest wall: RIJ PC Cardio: S1, S2 normal and systolic murmur: systolic ejection 2/6, decrescendo at 2nd left intercostal space GI: obese, pos bs, soft, nontender Extremities: extremities normal, atraumatic, no cyanosis or edema  Lab Results: Recent Labs    06/23/18 2100 06/25/18 0030  WBC 5.4 5.1  HGB 15.7 14.7  HCT 45.7 42.1  PLT 219 188   BMET:  Recent Labs    06/25/18 0030 06/25/18 0623  NA 137 138  K 2.8* 2.9*  CL 97* 100  CO2 22 24  GLUCOSE 101* 134*  BUN 82* 52*  CREATININE 15.47* 11.87*  CALCIUM 7.6* 7.7*   No results for input(s): PTH in the last 72 hours. Iron Studies: No results for input(s): IRON, TIBC, TRANSFERRIN, FERRITIN in the last 72 hours.  Studies/Results: No results found.  I have reviewed the patient's current medications.  Assessment/Plan: 1 AKI  Making more urine.  For HD, no chem back.  ACEI, NSAIDs, Etglycol 2 HTN controlled 3 ETglycol ingestion  P Hd, follow chem, urine vol     LOS: 5 days   Fayrene FearingJames Jamason Peckham 06/26/2018,10:26 AM

## 2018-06-26 NOTE — Procedures (Signed)
I was present at this session.  I have reviewed the session itself and made appropriate changes.  HD via. PC, flows low 3rd HD. tol well.  Access press ok. Bps 150s   Beryle LatheJames Bernetha Anschutz 8/12/20193:55 PM

## 2018-06-26 NOTE — Progress Notes (Signed)
Pt's significant other called this RN to come assess pt's HD catheter. Pt c/o pain from the site and the skin above the catheter is now red. The dressing is also loose at the top and moisture is under the dressing. This RN messaged IV team to change the dressing. This RN will let oncoming RN know about the skin being red. Will continue to monitor.   Larey Dayshristy M Mikka Kissner, RN

## 2018-06-26 NOTE — Progress Notes (Signed)
[facility] PROGRESS NOTE  Bryan Riggs  ZOX:[id number] DOB: 11/27/1965 DOA: 06/20/2018 PCP: Clovis RileyMitchell, L.August Saucerean, MD  Brief Narrative: Bryan Riggs is a 52 y.o. male with a history of HTN who presented to the ED 8/7 with multiple complaints including dizziness, fatigue, malaise, nausea, a couple episodes of emesis, loose stools and pain in the right flank/lower back. He additionally admitted to an intentional ingestion of aleve (~10 pills), tylenol, a sleep aid (both unknown), and about 1/2 cup of antifreeze mixed in [place] about 2 weeks prior. This was impulsive, after returning from work that day with a headache, back ache, and severe stress around financial issues with insomnia and depression. He felt embarrassed and remorseful, did not seek medical attention and has continued to go to work, but sought care when multiple symptoms as above worsened. He was found to be in renal failure with creatinine 9.2 (from previous 1.55), BUN 51. Toxicology screen, including tylenol, salicylates, methanol, ethanol, ethylene glycol, was negative. CT renal study showed apparently normal kidneys without hydronephrosis. Urinalysis showed proteinuria, pyuria, and hematuria. Ceftriaxone and IV fluids were administered, poison control consulted, and patient admitted. Psychiatry has evaluated the patient and cleared him for discharge to the appropriate setting. Creatinine has continued to climb and nephrology has recommended initiating hemodialysis. IR consulted for HD catheter placement 8/9 and started HD 8/9, 8/10.   Subjective: Abd pain significantly improved. No vomiting, still having some nausea. Feels he's starting to make more urine.   Objective: BP (!) 153/99 (BP Location: Left Arm)   Pulse 75   Temp 98 F (36.7 C) (Oral)   Resp 20   Ht 6' (1.829 m)   Wt 121.8 kg   SpO2 97%   BMI 36.42 kg/m   Gen: 52 y.o. male in no distress Pulm: Nonlabored breathing room air. Clear. CV:  Regular rate and rhythm. No murmur, rub, or gallop. No JVD, trace nonpitting dependent edema. GI: Abdomen soft, non-tender, non-distended, with normoactive bowel sounds.  Ext: Warm, no deformities Skin: No rashes, lesions or ulcers on visualized skin.  Neuro: Alert and oriented. No focal neurological deficits. Psych: Judgement and insight appear fair. Mood euthymic & affect congruent. Behavior is appropriate.    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 s/p TDC right IJ 8/9, started HD 8/9, again 8/10. - Holding ACE, avoiding contrast, etc. - Monitoring renal function panel, UOP daily (pending today). Planning repeat HD 8/12.  Metabolic acidosis: Due to renal failure.  - Resolved with bicarb, stopped.   Hypokalemia: Supplement per nephrology, and with HD today. Awaiting repeat labs.  Intentional ingestion of toxic agents: 2 weeks PTA, remorseful, feels it was a poor and impulsive decision. Has support systems in place and no previous attempts. INR, LFTs, CK not elevated. Has acidosis with anion gap, lactic acid normal.  - Psychiatry evaluation 8/7, no need for inpatient psychiatry hospitalization.  - Continue supportive measures.  Pyuria: Not overtly symptomatic, no growth on urine culture. Completed 3 days of CTX.  HTN:  - Holding diuretic and ACE due to renal failure.  - Norvasc 10mg , hydralazine, imdur, metoprolol. Remains uncontrolled but don't want to limit HD by dropping too low.  Increased imdur 30mg  > 60mg  qd. Probably could augment regimen further, may also improve with volume removal. - Manage with HD.  Heparin-induced ecchymoses: No evidnece of  significant hemorrhage.  - Start SCDs in lieu of pharmacologic DVT ppx. Pt is quite ambulatory and encouraged this to continue for DVT ppx, general mental and physical health.  Time spent: 25 min.  Tyrone Nineyan B Kijuana Ruppel, MD [facility] [email or web address] Password [email or web address] 06/26/2018, 11:26 AM

## 2018-06-27 DIAGNOSIS — N183 Chronic kidney disease, stage 3 (moderate): Secondary | ICD-10-CM

## 2018-06-27 LAB — RENAL FUNCTION PANEL
ANION GAP: 11 (ref 5–15)
Albumin: 2.9 g/dL — ABNORMAL LOW (ref 3.5–5.0)
BUN: 47 mg/dL — ABNORMAL HIGH (ref 6–20)
CHLORIDE: 107 mmol/L (ref 98–111)
CO2: 25 mmol/L (ref 22–32)
Calcium: 8.5 mg/dL — ABNORMAL LOW (ref 8.9–10.3)
Creatinine, Ser: 10.23 mg/dL — ABNORMAL HIGH (ref 0.61–1.24)
GFR calc Af Amer: 6 mL/min — ABNORMAL LOW (ref >60)
GFR calc non Af Amer: 5 mL/min — ABNORMAL LOW (ref >60)
Glucose, Bld: 101 mg/dL — ABNORMAL HIGH (ref 70–99)
POTASSIUM: 3.9 mmol/L (ref 3.5–5.1)
Phosphorus: 4.5 mg/dL (ref 2.5–4.6)
Sodium: 143 mmol/L (ref 135–145)

## 2018-06-27 NOTE — Progress Notes (Signed)
Subjective: Interval History: has no complaint, making more urine.  Objective: Vital signs in last 24 hours: Temp:  [97.6 F (36.4 C)-98.8 F (37.1 C)] 98.5 F (36.9 C) (08/13 0832) Pulse Rate:  [70-91] 70 (08/13 0832) Resp:  [18-20] 20 (08/13 0832) BP: (125-196)/(76-114) 150/94 (08/13 0832) SpO2:  [94 %-98 %] 94 % (08/13 0832) Weight:  [116.3 kg-117.9 kg] 116.3 kg (08/12 1622) Weight change: -3.9 kg  Intake/Output from previous day: 08/12 0701 - 08/13 0700 In: 543 [P.O.:540; I.V.:3] Out: 3950 [Urine:2950] Intake/Output this shift: Total I/O In: 243 [P.O.:240; I.V.:3] Out: 1050 [Urine:1050]  General appearance: alert, cooperative and no distress Resp: clear to auscultation bilaterally Chest wall: RIJ cath Cardio: S1, S2 normal and systolic murmur: systolic ejection 2/6, decrescendo at 2nd left intercostal space GI: soft, non-tender; bowel sounds normal; no masses,  no organomegaly Extremities: extremities normal, atraumatic, no cyanosis or edema  Lab Results: Recent Labs    06/25/18 0030 06/26/18 1400  WBC 5.1 5.1  HGB 14.7 14.8  HCT 42.1 44.6  PLT 188 216   BMET:  Recent Labs    06/26/18 1430 06/27/18 0612  NA 141 143  K 3.6 3.9  CL 104 107  CO2 23 25  GLUCOSE 83 101*  BUN 67* 47*  CREATININE 13.11* 10.23*  CALCIUM 8.3* 8.5*   No results for input(s): PTH in the last 72 hours. Iron Studies: No results for input(s): IRON, TIBC, TRANSFERRIN, FERRITIN in the last 72 hours.  Studies/Results: No results found.  I have reviewed the patient's current medications.  Assessment/Plan: 1 AKI nonoliguric, no function yet, check che in am 2 HTN controlled 3 ETGlycol od P check chem in am, monitor urine vol,  HD if needed  LOS: 6 days   Fayrene FearingJames Cheris Tweten 06/27/2018,10:15 AM

## 2018-06-27 NOTE — Progress Notes (Signed)
PROGRESS NOTE    Francene FindersHarold J Riggs  ZOX:[id number] DOB: 02-22-1966 DOA: 06/20/2018 PCP: Clovis RileyMitchell, L.August Saucerean, MD    Brief Narrative: Bryan CriglerHarold "Jasen" Ervin KnackMilliken is a 52 y.o. male with a history of HTN who presented to the ED 8/7 with multiple complaints including dizziness, fatigue, malaise, nausea, a couple episodes of emesis, loose stools and pain in the right flank/lower back. He additionally admitted to an intentional ingestion of aleve (~10 pills), tylenol, a sleep aid (both unknown), and about 1/2 cup of antifreeze mixed in [place] about 2 weeks prior. This was impulsive, after returning from work that day with a headache, back ache, and severe stress around financial issues with insomnia and depression. He felt embarrassed and remorseful, did not seek medical attention and has continued to go to work, but sought care when multiple symptoms as above worsened. He was found to be in renal failure with creatinine 9.2 (from previous 1.55), BUN 51. Toxicology screen, including tylenol, salicylates, methanol, ethanol, ethylene glycol, was negative. CT renal study showed apparently normal kidneys without hydronephrosis. Urinalysis showed proteinuria, pyuria, and hematuria. Ceftriaxone and IV fluids were administered, poison control consulted, and patient admitted. Psychiatry has evaluated the patient and cleared him for discharge to the appropriate setting. Creatinine has continued to climb and nephrology has recommended initiating hemodialysis. IR consulted for HD catheter placement 8/9 and started HD 8/9, 8/10.   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 S/p TDC right IJ 8/9, HD  started .  Holding ACE inhibitor.    Metabolic acidosis: Resolved.    Hypokalemia: replaced.    Hypertension: well controlled.     Heparin induced ecchymosis: Resolved.    Pyuria:  No growth on urine culture.  Completed 3 days of CTX.    Intentional ingestion of toxic agents: Psychiatry consulted and no need for psychiatry hospitalization. No suicidal ideation.  Continue with supportive measures.     DVT prophylaxis:scd's Code Status: full code.  Family Communication: none at bedside.  Disposition Plan: pending clinical improvement.    Consultants:   Nephrology.    Procedures: none.   Antimicrobials:  None.   Subjective:  breathing better.   Objective: Vitals:   06/26/18 2144 06/27/18 0556 06/27/18 0832 06/27/18 1610  BP: 125/76 133/90 (!) 150/94 (!) 156/101  Pulse: 86 86 70 77  Resp: 18 20 20    Temp: 98.8 F (37.1 C) 98.7 F (37.1 C) 98.5 F (36.9 C) (!) 97.5 F (36.4 C)  TempSrc: Oral Oral Oral Oral  SpO2: 94% 97% 94% 93%  Weight:      Height:        Intake/Output Summary (Last 24 hours) at 06/27/2018 1912 Last data filed at 06/27/2018 1622 Gross per 24 hour  Intake 706 ml  Output 3750 ml  Net -3044 ml   Filed Weights   06/26/18 0500 06/26/18 1300 06/26/18 1622  Weight: 121.8 kg 117.9 kg 116.3 kg    Examination:  General exam: Appears calm and comfortable  Respiratory system: Clear to auscultation. Respiratory effort normal. Cardiovascular system: S1 & S2 heard, RRR.No pedal edema. Gastrointestinal system: Abdomen is nondistended, soft and nontender. No organomegaly or masses felt. Normal bowel sounds  heard. Central nervous system: Alert and oriented. No focal neurological deficits. Extremities: Symmetric 5 x 5 power. Skin: No rashes, lesions or ulcers Psychiatry:  Mood & affect appropriate.     Data Reviewed: I have personally reviewed following labs and imaging studies  CBC: Recent Labs  Lab 06/21/18 1128 06/22/18 0532  06/23/18 2100 06/25/18 0030 06/26/18 1400  WBC 6.8 7.4 5.4 5.1 5.1  HGB 15.9 15.6 15.7 14.7 14.8  HCT 46.3 46.6 45.7 42.1 44.6  MCV 87.4 90.0 87.9 86.4 89.9  PLT 173 195 219 188 216   Basic Metabolic Panel: Recent Labs  Lab 06/21/18 0335  06/24/18 0431 06/25/18 0030 06/25/18 0623 06/26/18 1430 06/27/18 0612  NA  --    < > 137 137 138 141 143  K  --    < > 3.1* 2.8* 2.9* 3.6 3.9  CL  --    < > 103 97* 100 104 107  CO2  --    < > 18* 22 24 23 25   GLUCOSE  --    < > 99 101* 134* 83 101*  BUN  --    < > 74* 82* 52* 67* 47*  CREATININE  --    < > 14.64* 15.47* 11.87* 13.11* 10.23*  CALCIUM  --    < > 7.7* 7.6* 7.7* 8.3* 8.5*  MG 2.1  --   --   --   --   --   --   PHOS  --    < > 6.6* 6.4* 4.7* 4.7* 4.5   < > = values in this interval not displayed.   GFR: Estimated Creatinine Clearance: 11 mL/min (A) (by C-G formula based on SCr of 10.23 mg/dL (H)). Liver Function Tests: Recent Labs  Lab 06/23/18 2100 06/24/18 0431 06/25/18 0030 06/25/18 0623 06/26/18 1430 06/27/18 0612  ALT 20  --   --   --   --   --   ALBUMIN 2.8* 2.6* 2.7* 2.7* 3.0* 2.9*   No results for input(s): LIPASE, AMYLASE in the last 168 hours. No results for input(s): AMMONIA in the last 168 hours. Coagulation Profile: Recent Labs  Lab 06/21/18 0200  INR 1.03   Cardiac Enzymes: Recent Labs  Lab 06/21/18 0335  CKTOTAL 278   BNP (last 3 results) No results for input(s): PROBNP in the last 8760 hours. HbA1C: No results for input(s): HGBA1C in the last 72 hours. CBG: No results for input(s): GLUCAP in the last 168 hours. Lipid Profile: No results for input(s): CHOL, HDL, LDLCALC, TRIG, CHOLHDL, LDLDIRECT in the last 72 hours. Thyroid Function Tests: No results for input(s): TSH, T4TOTAL, FREET4, T3FREE, THYROIDAB in the last 72 hours. Anemia Panel: No results for input(s): VITAMINB12, FOLATE, FERRITIN, TIBC, IRON, RETICCTPCT in the last 72 hours. Sepsis Labs: Recent Labs  Lab 06/22/18 1049  06/22/18 1325  LATICACIDVEN 0.8 0.6    Recent Results (from the past 240 hour(s))  Urine Culture     Status: None   Collection Time: 06/21/18  8:39 AM  Result Value Ref Range Status   Specimen Description URINE, CLEAN CATCH  Final   Special Requests Normal  Final   Culture   Final    NO GROWTH Performed at [facility], [address] [place], [place] [address]    Report Status 06/22/2018 FINAL  Final         Radiology Studies: No results found.      Scheduled Meds: . amLODipine  10 mg Oral Daily  .  Chlorhexidine Gluconate Cloth  6 each Topical Q0600  . hydrALAZINE  50 mg Oral TID  . isosorbide mononitrate  60 mg Oral Daily  . metoprolol succinate  100 mg Oral Daily  . sodium chloride flush  3 mL Intravenous Q12H   Continuous Infusions: . sodium chloride       LOS: 6 days    Time spent: 35 minutes.     Kathlen Mody, MD [facility] Pager [phone] If 7PM-7AM, please contact night-coverage [email or web address] Password [email or web address] 06/27/2018, 7:12 PM

## 2018-06-28 LAB — RENAL FUNCTION PANEL
ALBUMIN: 3.3 g/dL — AB (ref 3.5–5.0)
Anion gap: 13 (ref 5–15)
BUN: 53 mg/dL — ABNORMAL HIGH (ref 6–20)
CALCIUM: 9 mg/dL (ref 8.9–10.3)
CO2: 25 mmol/L (ref 22–32)
CREATININE: 9.6 mg/dL — AB (ref 0.61–1.24)
Chloride: 104 mmol/L (ref 98–111)
GFR, EST AFRICAN AMERICAN: 6 mL/min — AB (ref >60)
GFR, EST NON AFRICAN AMERICAN: 5 mL/min — AB (ref >60)
Glucose, Bld: 117 mg/dL — ABNORMAL HIGH (ref 70–99)
Phosphorus: 5.1 mg/dL — ABNORMAL HIGH (ref 2.5–4.6)
Potassium: 3.9 mmol/L (ref 3.5–5.1)
SODIUM: 142 mmol/L (ref 135–145)

## 2018-06-28 MED ORDER — SENNOSIDES-DOCUSATE SODIUM 8.6-50 MG PO TABS: 2.0000 | ORAL_TABLET | Freq: Every evening | ORAL | Status: DC | PRN

## 2018-06-28 NOTE — Progress Notes (Signed)
PROGRESS NOTE    Bryan Riggs  ZOX:[id number] DOB: 05-25-1966 DOA: 06/20/2018 PCP: Clovis RileyMitchell, L.August Saucerean, MD    Brief Narrative: Bryan Riggs is a 52 y.o. male with a history of HTN who presented to the ED 8/7 with multiple complaints including dizziness, fatigue, malaise, nausea, a couple episodes of emesis, loose stools and pain in the right flank/lower back. He additionally admitted to an intentional ingestion of aleve (~10 pills), tylenol, a sleep aid (both unknown), and about 1/2 cup of antifreeze mixed in [place] about 2 weeks prior. This was impulsive, after returning from work that day with a headache, back ache, and severe stress around financial issues with insomnia and depression. He felt embarrassed and remorseful, did not seek medical attention and has continued to go to work, but sought care when multiple symptoms as above worsened. He was found to be in renal failure with creatinine 9.2 (from previous 1.55), BUN 51. Toxicology screen, including tylenol, salicylates, methanol, ethanol, ethylene glycol, was negative. CT renal study showed apparently normal kidneys without hydronephrosis. Urinalysis showed proteinuria, pyuria, and hematuria. Ceftriaxone and IV fluids were administered, poison control consulted, and patient admitted. Psychiatry has evaluated the patient and cleared him for discharge to the appropriate setting. Creatinine has continued to climb and nephrology has recommended initiating hemodialysis. IR consulted for HD catheter placement 8/9 and started HD 8/9, 8/10.   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 S/p TDC right IJ 8/9, HD  started .  Holding ACE inhibitor.  For the first time his creatinine is improving without HD.  Repeat renal parameters in am if stable, or improving, may be he will be stable for d/c in 1 to 2 days after nephrology clears him.    Metabolic acidosis: Resolved.    Hypokalemia: replaced.    Hypertension: well controlled.     Heparin induced ecchymosis: Resolved.    Pyuria:  No growth on urine culture.  Completed 3 days of CTX.    Intentional ingestion of toxic agents: Psychiatry consulted and no need for psychiatry hospitalization. No suicidal ideation.  Continue with supportive measures.   CONSTIPATION: Stool softeners added.   DVT prophylaxis:scd's Code Status: full code.  Family Communication: none at bedside.  Disposition Plan: pending improvement in renal parameters.    Consultants:   Nephrology.    Procedures: none.   Antimicrobials:  None.   Subjective: No chest pain or sob, no nausea or vomiting. Or abdominal pain.   Objective: Vitals:   06/27/18 2025 06/28/18 0535 06/28/18 0828 06/28/18 1731  BP: (!) 139/92 (!) 148/99 (!) 152/90 (!) 142/96  Pulse: 80 76 74 73  Resp: 18 18 20 18   Temp: 99.1 F (37.3 C) 99 F (37.2 C) 99.3 F (37.4 C) 98.3 F (36.8 C)  TempSrc: Oral Oral Oral Oral  SpO2: 95% 99% 100% 95%  Weight:      Height:        Intake/Output Summary (Last 24 hours) at 06/28/2018 1930 Last data filed at 06/28/2018 1918 Gross per 24 hour  Intake 540 ml  Output 5350 ml  Net -4810 ml   Filed Weights   06/26/18 0500 06/26/18 1300 06/26/18  1622  Weight: 121.8 kg 117.9 kg 116.3 kg    Examination: no change in exam when compared to yesterday.   General exam: Appears calm and comfortable  Respiratory system: Clear to auscultation. Respiratory effort normal. Cardiovascular system: S1 & S2 heard, RRR.No pedal edema. Gastrointestinal system: Abdomen is soft NT ND BS+ Central nervous system: Alert and oriented. No focal neurological  deficits. Extremities: Symmetric 5 x 5 power. Skin: No rashes, lesions or ulcers Psychiatry:  Mood & affect appropriate.     Data Reviewed: I have personally reviewed following labs and imaging studies  CBC: Recent Labs  Lab 06/22/18 0532 06/23/18 2100 06/25/18 0030 06/26/18 1400  WBC 7.4 5.4 5.1 5.1  HGB 15.6 15.7 14.7 14.8  HCT 46.6 45.7 42.1 44.6  MCV 90.0 87.9 86.4 89.9  PLT 195 219 188 216   Basic Metabolic Panel: Recent Labs  Lab 06/25/18 0030 06/25/18 0623 06/26/18 1430 06/27/18 0612 06/28/18 0608  NA 137 138 141 143 142  K 2.8* 2.9* 3.6 3.9 3.9  CL 97* 100 104 107 104  CO2 22 24 23 25 25   GLUCOSE 101* 134* 83 101* 117*  BUN 82* 52* 67* 47* 53*  CREATININE 15.47* 11.87* 13.11* 10.23* 9.60*  CALCIUM 7.6* 7.7* 8.3* 8.5* 9.0  PHOS 6.4* 4.7* 4.7* 4.5 5.1*   GFR: Estimated Creatinine Clearance: 11.7 mL/min (A) (by C-G formula based on SCr of 9.6 mg/dL (H)). Liver Function Tests: Recent Labs  Lab 06/23/18 2100  06/25/18 0030 06/25/18 0623 06/26/18 1430 06/27/18 0612 06/28/18 0608  ALT 20  --   --   --   --   --   --   ALBUMIN 2.8*   < > 2.7* 2.7* 3.0* 2.9* 3.3*   < > = values in this interval not displayed.   No results for input(s): LIPASE, AMYLASE in the last 168 hours. No results for input(s): AMMONIA in the last 168 hours. Coagulation Profile: No results for input(s): INR, PROTIME in the last 168 hours. Cardiac Enzymes: No results for input(s): CKTOTAL, CKMB, CKMBINDEX, TROPONINI in the last 168 hours. BNP (last 3 results) No results for input(s): PROBNP in the last 8760 hours. HbA1C: No results for input(s): HGBA1C in the last 72 hours. CBG: No results for input(s): GLUCAP in the last 168 hours. Lipid Profile: No results for input(s): CHOL, HDL, LDLCALC, TRIG, CHOLHDL, LDLDIRECT in the last 72 hours. Thyroid Function Tests: No results for input(s): TSH, T4TOTAL, FREET4, T3FREE, THYROIDAB in the last 72 hours. Anemia Panel: No results for  input(s): VITAMINB12, FOLATE, FERRITIN, TIBC, IRON, RETICCTPCT in the last 72 hours. Sepsis Labs: Recent Labs  Lab 06/22/18 1049 06/22/18 1325  LATICACIDVEN 0.8 0.6    Recent Results (from the past 240 hour(s))  Urine Culture     Status: None   Collection Time: 06/21/18  8:39 AM  Result Value Ref Range Status   Specimen Description URINE, CLEAN CATCH  Final   Special Requests Normal  Final   Culture   Final    NO GROWTH Performed at [facility], [address] [place], [place] [address]    Report Status 06/22/2018 FINAL  Final         Radiology Studies: No results found.      Scheduled Meds: . amLODipine  10 mg Oral Daily  . Chlorhexidine Gluconate Cloth  6 each Topical Q0600  . hydrALAZINE  50 mg Oral TID  . isosorbide mononitrate  60 mg Oral Daily  . metoprolol  succinate  100 mg Oral Daily  . sodium chloride flush  3 mL Intravenous Q12H   Continuous Infusions: . sodium chloride       LOS: 7 days    Time spent: 35 minutes.     Kathlen Mody, MD [facility] Pager [phone] If 7PM-7AM, please contact night-coverage [email or web address] Password [email or web address] 06/28/2018, 7:30 PM

## 2018-06-28 NOTE — Progress Notes (Signed)
Subjective: Interval History: has no complaint .  Objective: Vital signs in last 24 hours: Temp:  [97.5 F (36.4 C)-99.3 F (37.4 C)] 99.3 F (37.4 C) (08/14 0828) Pulse Rate:  [74-80] 74 (08/14 0828) Resp:  [18-20] 20 (08/14 0828) BP: (139-156)/(90-101) 152/90 (08/14 0828) SpO2:  [93 %-100 %] 100 % (08/14 0828) Weight change:   Intake/Output from previous day: 08/13 0701 - 08/14 0700 In: 943 [P.O.:940; I.V.:3] Out: 5750 [Urine:5750] Intake/Output this shift: No intake/output data recorded.  General appearance: alert, cooperative, no distress and mildly obese Resp: clear to auscultation bilaterally Chest wall: RIJ PC Cardio: S1, S2 normal and systolic murmur: systolic ejection 2/6, decrescendo at 2nd left intercostal space GI: soft, nontender, pos bs, liver down 4 cm Extremities: extremities normal, atraumatic, no cyanosis or edema  Lab Results: Recent Labs    06/26/18 1400  WBC 5.1  HGB 14.8  HCT 44.6  PLT 216   BMET:  Recent Labs    06/27/18 0612 06/28/18 0608  NA 143 142  K 3.9 3.9  CL 107 104  CO2 25 25  GLUCOSE 101* 117*  BUN 47* 53*  CREATININE 10.23* 9.60*  CALCIUM 8.5* 9.0   No results for input(s): PTH in the last 72 hours. Iron Studies: No results for input(s): IRON, TIBC, TRANSFERRIN, FERRITIN in the last 72 hours.  Studies/Results: No results found.  I have reviewed the patient's current medications.  Assessment/Plan: 1 AKI Cr down for 1st time off HD.  Acid/base/k/volok. If cont to drop can get cath out and d/c within 48 h. 2 HTn controlled 3 Etglycol  P follow Cr, vol, if cont d/c soon    LOS: 7 days   Fayrene FearingJames Claudy Abdallah 06/28/2018,10:14 AM

## 2018-06-28 NOTE — Progress Notes (Signed)
PROGRESS NOTE    Francene FindersHarold J XXXMilliken  ZOX:[id number] DOB: [birth year] DOA: 06/20/2018 PCP: Clovis RileyMitchell, L.August Saucerean, MD    Brief Narrative: Bryan Riggs is a 52 y.o. male with a history of HTN who presented to the ED 8/7 with multiple complaints including dizziness, fatigue, malaise, nausea, a couple episodes of emesis, loose stools and pain in the right flank/lower back. He additionally admitted to an intentional ingestion of aleve (~10 pills), tylenol, a sleep aid (both unknown), and about 1/2 cup of antifreeze mixed in [place] about 2 weeks prior. This was impulsive, after returning from work that day with a headache, back ache, and severe stress around financial issues with insomnia and depression. He felt embarrassed and remorseful, did not seek medical attention and has continued to go to work, but sought care when multiple symptoms as above worsened. He was found to be in renal failure with creatinine 9.2 (from previous 1.55), BUN 51. Toxicology screen, including tylenol, salicylates, methanol, ethanol, ethylene glycol, was negative. CT renal study showed apparently normal kidneys without hydronephrosis. Urinalysis showed proteinuria, pyuria, and hematuria. Ceftriaxone and IV fluids were administered, poison control consulted, and patient admitted. Psychiatry has evaluated the patient and cleared him for discharge to the appropriate setting. Creatinine has continued to climb and nephrology has recommended initiating hemodialysis. IR consulted for HD catheter placement 8/9 and started HD 8/9, 8/10.   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 S/p TDC right IJ 8/9, HD  started .  Holding ACE inhibitor.  For the first time his creatinine is improving without HD.  Repeat renal parameters in am if stable, or improving, may be he will be stable for d/c in 1 to 2 days after nephrology clears him.    Metabolic acidosis: Resolved.    Hypokalemia: replaced.    Hypertension: well controlled.     Heparin induced ecchymosis: Resolved.    Pyuria:  No growth on urine culture.  Completed 3 days of CTX.    Intentional ingestion of toxic agents: Psychiatry consulted and no need for psychiatry hospitalization. No suicidal ideation.  Continue with supportive measures.     DVT prophylaxis:scd's Code Status: full code.  Family Communication: none at bedside.  Disposition Plan: pending improvement in renal parameters.    Consultants:   Nephrology.    Procedures: none.   Antimicrobials:  None.   Subjective: No chest pain or sob, no nausea or vomiting. Or abdominal pain.   Objective: Vitals:   06/27/18 2025 06/28/18 0535 06/28/18 0828 06/28/18 1731  BP: (!) 139/92 (!) 148/99 (!) 152/90 (!) 142/96  Pulse: 80 76 74 73  Resp: 18 18 20 18   Temp: 99.1 F (37.3 C) 99 F (37.2 C) 99.3 F (37.4 C) 98.3 F (36.8 C)  TempSrc: Oral Oral Oral Oral  SpO2: 95% 99% 100% 95%  Weight:      Height:        Intake/Output Summary (Last 24 hours) at 06/28/2018 1927 Last data filed at 06/28/2018 1918 Gross per 24 hour  Intake 540 ml  Output 5350 ml  Net -4810 ml   Filed Weights   06/26/18 0500 06/26/18 1300 06/26/18 1622  Weight: 121.8  kg 117.9 kg 116.3 kg    Examination: no change in exam when compared to yesterday.   General exam: Appears calm and comfortable  Respiratory system: Clear to auscultation. Respiratory effort normal. Cardiovascular system: S1 & S2 heard, RRR.No pedal edema. Gastrointestinal system: Abdomen is soft NT ND BS+ Central nervous system: Alert and oriented. No focal neurological deficits. Extremities: Symmetric 5 x 5  power. Skin: No rashes, lesions or ulcers Psychiatry:  Mood & affect appropriate.     Data Reviewed: I have personally reviewed following labs and imaging studies  CBC: Recent Labs  Lab 06/22/18 0532 06/23/18 2100 06/25/18 0030 06/26/18 1400  WBC 7.4 5.4 5.1 5.1  HGB 15.6 15.7 14.7 14.8  HCT 46.6 45.7 42.1 44.6  MCV 90.0 87.9 86.4 89.9  PLT 195 219 188 216   Basic Metabolic Panel: Recent Labs  Lab 06/25/18 0030 06/25/18 0623 06/26/18 1430 06/27/18 0612 06/28/18 0608  NA 137 138 141 143 142  K 2.8* 2.9* 3.6 3.9 3.9  CL 97* 100 104 107 104  CO2 22 24 23 25 25   GLUCOSE 101* 134* 83 101* 117*  BUN 82* 52* 67* 47* 53*  CREATININE 15.47* 11.87* 13.11* 10.23* 9.60*  CALCIUM 7.6* 7.7* 8.3* 8.5* 9.0  PHOS 6.4* 4.7* 4.7* 4.5 5.1*   GFR: Estimated Creatinine Clearance: 11.7 mL/min (A) (by C-G formula based on SCr of 9.6 mg/dL (H)). Liver Function Tests: Recent Labs  Lab 06/23/18 2100  06/25/18 0030 06/25/18 0623 06/26/18 1430 06/27/18 0612 06/28/18 0608  ALT 20  --   --   --   --   --   --   ALBUMIN 2.8*   < > 2.7* 2.7* 3.0* 2.9* 3.3*   < > = values in this interval not displayed.   No results for input(s): LIPASE, AMYLASE in the last 168 hours. No results for input(s): AMMONIA in the last 168 hours. Coagulation Profile: No results for input(s): INR, PROTIME in the last 168 hours. Cardiac Enzymes: No results for input(s): CKTOTAL, CKMB, CKMBINDEX, TROPONINI in the last 168 hours. BNP (last 3 results) No results for input(s): PROBNP in the last 8760 hours. HbA1C: No results for input(s): HGBA1C in the last 72 hours. CBG: No results for input(s): GLUCAP in the last 168 hours. Lipid Profile: No results for input(s): CHOL, HDL, LDLCALC, TRIG, CHOLHDL, LDLDIRECT in the last 72 hours. Thyroid Function Tests: No results for input(s): TSH, T4TOTAL, FREET4, T3FREE, THYROIDAB in the last 72 hours. Anemia Panel: No results for input(s): VITAMINB12, FOLATE, FERRITIN,  TIBC, IRON, RETICCTPCT in the last 72 hours. Sepsis Labs: Recent Labs  Lab 06/22/18 1049 06/22/18 1325  LATICACIDVEN 0.8 0.6    Recent Results (from the past 240 hour(s))  Urine Culture     Status: None   Collection Time: 06/21/18  8:39 AM  Result Value Ref Range Status   Specimen Description URINE, CLEAN CATCH  Final   Special Requests Normal  Final   Culture   Final    NO GROWTH Performed at [facility], [address] [place], [place] [address]    Report Status 06/22/2018 FINAL  Final         Radiology Studies: No results found.      Scheduled Meds: . amLODipine  10 mg Oral Daily  . Chlorhexidine Gluconate Cloth  6 each Topical Q0600  . hydrALAZINE  50 mg Oral TID  . isosorbide mononitrate  60 mg Oral Daily  . metoprolol succinate  100 mg  Oral Daily  . sodium chloride flush  3 mL Intravenous Q12H   Continuous Infusions: . sodium chloride       LOS: 7 days    Time spent: 35 minutes.     Kathlen Mody, MD [facility] Pager [phone] If 7PM-7AM, please contact night-coverage [email or web address] Password [email or web address] 06/28/2018, 7:27 PM

## 2018-06-29 LAB — RENAL FUNCTION PANEL
ALBUMIN: 3.2 g/dL — AB (ref 3.5–5.0)
ANION GAP: 12 (ref 5–15)
BUN: 56 mg/dL — ABNORMAL HIGH (ref 6–20)
CALCIUM: 8.8 mg/dL — AB (ref 8.9–10.3)
CO2: 24 mmol/L (ref 22–32)
CREATININE: 8.13 mg/dL — AB (ref 0.61–1.24)
Chloride: 105 mmol/L (ref 98–111)
GFR, EST AFRICAN AMERICAN: 8 mL/min — AB (ref >60)
GFR, EST NON AFRICAN AMERICAN: 7 mL/min — AB (ref >60)
Glucose, Bld: 96 mg/dL (ref 70–99)
PHOSPHORUS: 6 mg/dL — AB (ref 2.5–4.6)
Potassium: 3.6 mmol/L (ref 3.5–5.1)
SODIUM: 141 mmol/L (ref 135–145)

## 2018-06-29 NOTE — Progress Notes (Signed)
PROGRESS NOTE    Bryan Riggs  KGM:[id number] DOB: [birth year] DOA: 06/20/2018 PCP: Clovis Riley, L.August Saucer, MD    Brief Narrative: Bryan Riggs is a 52 y.o. male with a history of HTN who presented to the ED 8/7 with multiple complaints including dizziness, fatigue, malaise, nausea, a couple episodes of emesis, loose stools and pain in the right flank/lower back. He additionally admitted to an intentional ingestion of aleve (~10 pills), tylenol, a sleep aid (both unknown), and about 1/2 cup of antifreeze mixed in [place] about 2 weeks prior. This was impulsive, after returning from work that day with a headache, back ache, and severe stress around financial issues with insomnia and depression. He felt embarrassed and remorseful, did not seek medical attention and has continued to go to work, but sought care when multiple symptoms as above worsened. He was found to be in renal failure with creatinine 9.2 (from previous 1.55), BUN 51. Toxicology screen, including tylenol, salicylates, methanol, ethanol, ethylene glycol, was negative. CT renal study showed apparently normal kidneys without hydronephrosis. Urinalysis showed proteinuria, pyuria, and hematuria. Ceftriaxone and IV fluids were administered, poison control consulted, and patient admitted. Psychiatry has evaluated the patient and cleared him for discharge to the appropriate setting. Creatinine has continued to climb and nephrology has recommended initiating hemodialysis. IR consulted for HD catheter placement 8/9 and started HD 8/9, 8/10.   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 S/p TDC right IJ 8/9, HD  started. But has been on hold for the last 3 days.  Holding ACE inhibitor.  For the first time his creatinine is improving without HD.  Repeat renal parameters show improvement. Continue monitoring overnight, if creatinine improves , HD catheter can be removed and he can be discharged in am.   Metabolic acidosis: Resolved.    Hypokalemia: replaced.    Hypertension: well controlled.     Heparin induced ecchymosis: Resolved.    Pyuria:  No growth on urine culture.  Completed 3 days of CTX.    Intentional ingestion of toxic agents: Psychiatry consulted and no need for psychiatry hospitalization. No suicidal ideation.  Continue with supportive measures.   CONSTIPATION: Stool softeners added.   DVT prophylaxis:scd's Code Status: full code.  Family Communication: none at bedside.  Disposition Plan: pending improvement in renal parameters.    Consultants:   Nephrology.    Procedures: none.   Antimicrobials:  None.   Subjective: No complaints.   Objective: Vitals:   06/28/18 2120 06/28/18 2335 06/29/18 0552 06/29/18 1014  BP: (!) 149/95 (!) 162/99 (!) 149/94 (!) 154/86  Pulse: 83  76 83  Resp: 18  18 18   Temp: 98.4 F (36.9 C)  98.4 F (36.9 C) 98.4 F (36.9 C)  TempSrc: Oral  Oral Oral  SpO2: 99%  97% 95%  Weight:      Height:        Intake/Output Summary (Last 24 hours) at 06/29/2018 1710 Last data filed at 06/29/2018 1300 Gross per 24 hour  Intake 360 ml  Output 4300 ml  Net -3940 ml   Filed Weights   06/26/18 0500 06/26/18 1300 06/26/18 1622  Weight: 121.8 kg 117.9 kg  116.3 kg    Examination:   General exam:calm and comfortable.  Respiratory system: Clear to auscultation. Respiratory effort normal. No wheezing or rhonchi.  Cardiovascular system: S1 & S2 heard, RRR.No pedal edema. Gastrointestinal system: Abdomen is soft non tender non distended bowel sounds good.  Central nervous system: Alert and oriented. No focal neurological  deficits. Extremities: Symmetric 5 x 5 power. Skin: No rashes, lesions or ulcers Psychiatry:  Mood & affect appropriate.     Data Reviewed: I have personally reviewed following labs and imaging studies  CBC: Recent Labs  Lab 06/23/18 2100 06/25/18 0030 06/26/18 1400  WBC 5.4 5.1 5.1  HGB 15.7 14.7 14.8  HCT 45.7 42.1 44.6  MCV 87.9 86.4 89.9  PLT 219 188 216   Basic Metabolic Panel: Recent Labs  Lab 06/25/18 0623 06/26/18 1430 06/27/18 0612 06/28/18 0608 06/29/18 0335  NA 138 141 143 142 141  K 2.9* 3.6 3.9 3.9 3.6  CL 100 104 107 104 105  CO2 24 23 25 25 24   GLUCOSE 134* 83 101* 117* 96  BUN 52* 67* 47* 53* 56*  CREATININE 11.87* 13.11* 10.23* 9.60* 8.13*  CALCIUM 7.7* 8.3* 8.5* 9.0 8.8*  PHOS 4.7* 4.7* 4.5 5.1* 6.0*   GFR: Estimated Creatinine Clearance: 13.8 mL/min (A) (by C-G formula based on SCr of 8.13 mg/dL (H)). Liver Function Tests: Recent Labs  Lab 06/23/18 2100  06/25/18 0623 06/26/18 1430 06/27/18 0612 06/28/18 0608 06/29/18 0335  ALT 20  --   --   --   --   --   --   ALBUMIN 2.8*   < > 2.7* 3.0* 2.9* 3.3* 3.2*   < > = values in this interval not displayed.   No results for input(s): LIPASE, AMYLASE in the last 168 hours. No results for input(s): AMMONIA in the last 168 hours. Coagulation Profile: No results for input(s): INR, PROTIME in the last 168 hours. Cardiac Enzymes: No results for input(s): CKTOTAL, CKMB, CKMBINDEX, TROPONINI in the last 168 hours. BNP (last 3 results) No results for input(s): PROBNP in the last 8760 hours. HbA1C: No results for input(s): HGBA1C in the last 72 hours. CBG: No results for input(s): GLUCAP in the last 168 hours. Lipid Profile: No results for input(s): CHOL, HDL, LDLCALC, TRIG, CHOLHDL, LDLDIRECT in the last 72 hours. Thyroid Function Tests: No results for input(s): TSH, T4TOTAL, FREET4, T3FREE, THYROIDAB in the last 72 hours. Anemia Panel: No results for input(s): VITAMINB12, FOLATE, FERRITIN,  TIBC, IRON, RETICCTPCT in the last 72 hours. Sepsis Labs: No results for input(s): PROCALCITON, LATICACIDVEN in the last 168 hours.  Recent Results (from the past 240 hour(s))  Urine Culture     Status: None   Collection Time: 06/21/18  8:39 AM  Result Value Ref Range Status   Specimen Description URINE, CLEAN CATCH  Final   Special Requests Normal  Final   Culture   Final    NO GROWTH Performed at [facility], [address] [place], [place] [address]    Report Status 06/22/2018 FINAL  Final         Radiology Studies: No results found.      Scheduled Meds: . amLODipine  10 mg Oral Daily  . Chlorhexidine Gluconate Cloth  6 each Topical Q0600  . hydrALAZINE  50 mg Oral TID  . isosorbide mononitrate  60 mg Oral Daily  . metoprolol succinate  100 mg Oral Daily  . sodium chloride flush  3 mL Intravenous Q12H  Continuous Infusions: . sodium chloride       LOS: 8 days    Time spent: 35 minutes.     Kathlen ModyVijaya Scotland Dost, MD [facility] Pager [phone] If 7PM-7AM, please contact night-coverage [email or web address] Password [email or web address] 06/29/2018, 5:10 PM

## 2018-06-29 NOTE — Progress Notes (Signed)
Subjective: Interval History: has no complaint .  Objective: Vital signs in last 24 hours: Temp:  [98.3 F (36.8 C)-98.4 F (36.9 C)] 98.4 F (36.9 C) (08/15 0552) Pulse Rate:  [73-83] 76 (08/15 0552) Resp:  [18] 18 (08/15 0552) BP: (142-162)/(94-99) 149/94 (08/15 0552) SpO2:  [95 %-99 %] 97 % (08/15 0552) Weight change:   Intake/Output from previous day: 08/14 0701 - 08/15 0700 In: 300 [P.O.:300] Out: 4850 [Urine:4850] Intake/Output this shift: No intake/output data recorded.  General appearance: alert, cooperative, no distress and mildly obese Resp: clear to auscultation bilaterally Chest wall: RIJ PC Cardio: S1, S2 normal and systolic murmur: systolic ejection 2/6, decrescendo at 2nd left intercostal space GI: soft, non-tender; bowel sounds normal; no masses,  no organomegaly Extremities: extremities normal, atraumatic, no cyanosis or edema  Lab Results: Recent Labs    06/26/18 1400  WBC 5.1  HGB 14.8  HCT 44.6  PLT 216   BMET:  Recent Labs    06/28/18 0608 06/29/18 0335  NA 142 141  K 3.9 3.6  CL 104 105  CO2 25 24  GLUCOSE 117* 96  BUN 53* 56*  CREATININE 9.60* 8.13*  CALCIUM 9.0 8.8*   No results for input(s): PTH in the last 72 hours. Iron Studies: No results for input(s): IRON, TIBC, TRANSFERRIN, FERRITIN in the last 72 hours.  Studies/Results: No results found.  I have reviewed the patient's current medications.  Assessment/Plan: 1 AKI improving,  Steady decrease. If improves tomorrow , d/c PC and d/c for outpatient f/u 2 HTN improving with diuresis 3 ETglycol may have substantial  CKD with crystal induced injury, time will tell 4 Depression P follow chem , if better cath out and d/c    LOS: 8 days   Fayrene FearingJames Nicoletta Hush 06/29/2018,9:25 AM

## 2018-06-30 ENCOUNTER — Encounter (HOSPITAL_COMMUNITY): Payer: Self-pay | Admitting: Student

## 2018-06-30 ENCOUNTER — Inpatient Hospital Stay (HOSPITAL_COMMUNITY): Payer: BLUE CROSS/BLUE SHIELD

## 2018-06-30 DIAGNOSIS — I10 Essential (primary) hypertension: Secondary | ICD-10-CM

## 2018-06-30 HISTORY — PX: IR REMOVAL TUN CV CATH W/O FL: IMG2289

## 2018-06-30 LAB — RENAL FUNCTION PANEL
Albumin: 3.4 g/dL — ABNORMAL LOW (ref 3.5–5.0)
Anion gap: 12 (ref 5–15)
BUN: 62 mg/dL — ABNORMAL HIGH (ref 6–20)
CHLORIDE: 106 mmol/L (ref 98–111)
CO2: 23 mmol/L (ref 22–32)
Calcium: 8.8 mg/dL — ABNORMAL LOW (ref 8.9–10.3)
Creatinine, Ser: 7.22 mg/dL — ABNORMAL HIGH (ref 0.61–1.24)
GFR, EST AFRICAN AMERICAN: 9 mL/min — AB (ref >60)
GFR, EST NON AFRICAN AMERICAN: 8 mL/min — AB (ref >60)
Glucose, Bld: 132 mg/dL — ABNORMAL HIGH (ref 70–99)
POTASSIUM: 3.9 mmol/L (ref 3.5–5.1)
Phosphorus: 5.8 mg/dL — ABNORMAL HIGH (ref 2.5–4.6)
Sodium: 141 mmol/L (ref 135–145)

## 2018-06-30 MED ORDER — ISOSORBIDE MONONITRATE ER 60 MG PO TB24: 60.0000 mg | ORAL_TABLET | Freq: Every day | ORAL | 0 refills | Status: DC

## 2018-06-30 MED ORDER — TRAZODONE HCL 50 MG PO TABS: 50.0000 mg | ORAL_TABLET | Freq: Every evening | ORAL | 0 refills | Status: DC | PRN

## 2018-06-30 MED ORDER — HYDRALAZINE HCL 50 MG PO TABS: 50.0000 mg | ORAL_TABLET | Freq: Three times a day (TID) | ORAL | 0 refills | Status: DC

## 2018-06-30 MED ORDER — LIDOCAINE HCL 1 % IJ SOLN
INTRAMUSCULAR | Status: AC
  Filled 2018-06-30: qty 20

## 2018-06-30 MED ORDER — METOPROLOL SUCCINATE ER 100 MG PO TB24: 100.0000 mg | ORAL_TABLET | Freq: Every day | ORAL | 0 refills | Status: DC

## 2018-06-30 NOTE — Progress Notes (Signed)
Patient s/p tunneled dialysis catheter placement 06/23/18 by Dr. Fredia SorrowYamagata.  Patient with improvement in renal function, no need for dialysis at this time.  IR requested for tunneled dialysis catheter removal.   PA to bedside for removal.  Tunneled catheter removed in its entirety without complication.  Dressing applied.   Loyce DysKacie Nikita Surman, MS RD PA-C 2:26 PM

## 2018-06-30 NOTE — Progress Notes (Signed)
Subjective:  Interval History: has no complaint .  Objective: Vital signs in last 24 hours: Temp:  [97.6 F (36.4 C)-98.7 F (37.1 C)] 98.4 F (36.9 C) (08/16 0850) Pulse Rate:  [67-83] 74 (08/16 0850) Resp:  [18-20] 20 (08/16 0850) BP: (129-154)/(85-94) 142/93 (08/16 0850) SpO2:  [95 %-99 %] 98 % (08/16 0850) Weight change:   Intake/Output from previous day: 08/15 0701 - 08/16 0700 In: 820 [P.O.:820] Out: 3926 [Urine:3925; Stool:1] Intake/Output this shift: Total I/O In: 476 [P.O.:476] Out: -   General appearance: alert, cooperative and no distress Resp: clear to auscultation bilaterally Chest wall: RIJ PC Cardio: S1, S2 normal and systolic murmur: systolic ejection 2/6, decrescendo at 2nd left intercostal space Extremities: extremities normal, atraumatic, no cyanosis or edema  Lab Results: No results for input(s): WBC, HGB, HCT, PLT in the last 72 hours. BMET:  Recent Labs    06/29/18 0335 06/30/18 0453  NA 141 141  K 3.6 3.9  CL 105 106  CO2 24 23  GLUCOSE 96 132*  BUN 56* 62*  CREATININE 8.13* 7.22*  CALCIUM 8.8* 8.8*   No results for input(s): PTH in the last 72 hours. Iron Studies: No results for input(s): IRON, TIBC, TRANSFERRIN, FERRITIN in the last 72 hours.  Studies/Results: No results found.  I have reviewed the patient's current medications.  Assessment/Plan: 1 AKI improving . Can f/u outpatient.  Not sure how much function will return but now adequate to get by without dialysis. 2 HTN controlle 3 ETglycol P remove perm cath, ok to d/c from our standpoint    LOS: 9 days   Fayrene FearingJames Aretta Stetzel 06/30/2018,9:26 AM

## 2018-07-02 NOTE — Discharge Summary (Signed)
Physician Discharge Summary  Francene FindersHarold J XXXMilliken EAV:[id number] DOB: 02/08/1966 DOA: 06/20/2018  PCP: Clovis RileyMitchell, L.August Saucerean, MD  Admit date: 06/20/2018 Discharge date: 06/30/2018  Ad[facility].  Disposition:  Home.   Recommendations for Outpatient Follow-up:  1. Follow up with PCP in 1-2 weeks 2. Please obtain BMP/CBC in one week Please follow up with nephrology as recommended.  Please follow up with psychiatry as outpatient.   Discharge Condition:stable.  CODE STATUS: FULL CODE.  Diet recommendation: Heart Healthy Brief/Interim Summary: Bryan Riggs is a52 y.o.malewith a history of HTN who presented to the ED 8/7 with multiple complaints including dizziness, fatigue, malaise, nausea, a couple episodes of emesis, loose stools and pain in the right flank/lower back. He additionally admitted to an intentional ingestion of aleve (~10 pills), tylenol, a sleep aid (both unknown), and about 1/2 cup of antifreeze mixed in [place] about 2 weeks prior. This was impulsive, after returning from work that day with a headache, back ache, and severe stress around financial issues with insomnia and depression. He felt embarrassed and remorseful, did not seek medical attention and has continued to go to work, but sought care when multiple symptoms as above worsened. He was found to be in renal failure with creatinine 9.2 (from previous 1.55), BUN 51. Toxicology screen, including tylenol, salicylates, methanol, ethanol, ethylene glycol, was negative. CT renal study showed apparently normal kidneys without hydronephrosis. Urinalysis showed proteinuria, pyuria, and hematuria. Ceftriaxone and IV fluids were administered, poison control consulted, and patient admitted. Psychiatry has evaluated the patient and cleared him for discharge to the appropriate setting. Creatinine has continued to climb and nephrology has recommended initiating hemodialysis. IR consulted for HD catheter placement 8/9 and  started HD 8/9, 8/10.    As his creatinine improved, nephrology suggested to tae the tunneled catheter out before discharge and recommend outpatient follow up closely.   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 S/p TDC right IJ 8/9, HD started. But has been on hold for the last 3 days.  Holding ACE inhibitor.  For the first time his creatinine is improving without HD.  Repeat renal parameters show improvement.  HD catheter removed and he was discharged home.    Metabolic acidosis: Resolved.    Hypokalemia: replaced.    Hypertension: well controlled.     Heparin induced ecchymosis: Resolved.    Pyuria:  No growth on urine culture.  Completed 3 days of CTX.    Intentional ingestion of toxic agents: Psychiatry consulted and no need for psychiatry hospitalization. No suicidal ideation.  Continue with supportive measures.   CONSTIPATION: Stool softeners added  Discharge Instructions  Discharge Instructions    Diet - low sodium heart healthy   Complete by:  As directed    Discharge instructions   Complete by:  As directed    Please follow up with PCP in one week.  Please check your kidney function tests in one week.  Please follow u with your nephrologist as recommended.     Allergies as of 06/30/2018      Reactions   Sulfasalazine Nausea And Vomiting   Sulfa Antibiotics Rash, Nausea And Vomiting   Break out, itching   Viagra [  sildenafil Citrate] Other (See Comments)   FLUSHING      Medication List    STOP taking these medications   lisinopril-hydrochlorothiazide 20-25 MG tablet Commonly known as:  PRINZIDE,ZESTORETIC      TAKE these medications   amLODipine 10 MG tablet Commonly known as:  NORVASC Take 1 tablet (10 mg total) by mouth daily.   hydrALAZINE 50 MG tablet Commonly known as:  APRESOLINE Take 1 tablet (50 mg total) by mouth 3 (three) times daily.   isosorbide mononitrate 60 MG 24 hr tablet Commonly known as:  IMDUR Take 1 tablet (60 mg total) by mouth daily.   metoprolol succinate 100 MG 24 hr tablet Commonly known as:  TOPROL-XL Take 1 tablet (100 mg total) by mouth daily. Take with or immediately following a meal. What changed:    how much to take  how to take this  when to take this  additional instructions   sildenafil 100 MG tablet Commonly known as:  VIAGRA TAKE 1 TABLET(100 MG) BY MOUTH DAILY AS NEEDED FOR ERECTILE DYSFUNCTION   traZODone 50 MG tablet Commonly known as:  DESYREL Take 1 tablet (50 mg total) by mouth at bedtime as needed for sleep.      Follow-up Information    Clovis Riley, L.August Saucer, MD. Schedule an appointment as soon as possible for a visit in 1 week(s).   Specialty:  Family Medicine Contact information: [address] [place] [address] [phone]        Beryle Lathe, MD. Schedule an appointment as soon as possible for a visit in 1 week(s).   Specialty:  Nephrology Contact information: [address] [place] [address] [phone]          Allergies  Allergen Reactions  . Sulfasalazine Nausea And Vomiting  . Sulfa Antibiotics Rash and Nausea And Vomiting    Break out, itching  . Viagra [Sildenafil Citrate] Other (See Comments)    FLUSHING     Consultations:  Nephrology.    Procedures/Studies: Ir Fluoro Guide Cv Line Right  Result Date: 06/23/2018 CLINICAL DATA:  Acute renal failure requiring hemodialysis. EXAM: TUNNELED CENTRAL VENOUS HEMODIALYSIS CATHETER PLACEMENT WITH ULTRASOUND AND FLUOROSCOPIC GUIDANCE ANESTHESIA/SEDATION: 1.5 mg IV Versed; 75 mcg IV Fentanyl. Total Moderate Sedation Time:   25  minutes. The patient's level of consciousness and physiologic status were continuously monitored during the procedure by Radiology nursing. MEDICATIONS: 2 g IV Ancef. FLUOROSCOPY TIME:  30 seconds.  6.6 mGy. PROCEDURE: The procedure, risks, benefits, and alternatives were explained to the patient. Questions regarding the procedure were encouraged and answered. The patient understands and consents to the procedure. A timeout was performed prior to initiating the procedure. The right neck and chest were prepped with chlorhexidine in a sterile fashion, and a sterile drape was applied covering the operative field. Maximum barrier sterile technique with sterile gowns and gloves were used for the procedure. Local anesthesia was provided with 1% lidocaine. Ultrasound was used to confirm patency of the right internal jugular vein. After creating a small venotomy incision, a 21 gauge needle was advanced into the right internal jugular vein under direct, real-time ultrasound guidance. Ultrasound image documentation was performed. After securing guidewire access, an 8 Fr dilator was placed. A J-wire was kinked to measure appropriate catheter length. A Palindrome tunneled hemodialysis catheter measuring 23 cm from tip to cuff was chosen for placement. This was tunneled in a retrograde fashion from the chest wall to the venotomy incision. At the venotomy, serial dilatation was  performed and a 16 Fr peel-away sheath was placed over a guidewire. The catheter was then placed through the sheath and the sheath removed. Final catheter positioning was confirmed and documented with a fluoroscopic spot image. The catheter was aspirated, flushed with saline, and injected with appropriate volume heparin dwells. The venotomy incision was closed with subcuticular 4-0 Vicryl. Dermabond was applied to the incision. The catheter exit site was secured with 0-Prolene retention sutures. COMPLICATIONS: None.  No pneumothorax. FINDINGS: After  catheter placement, the tip lies in the right atrium. The catheter aspirates normally and is ready for immediate use. IMPRESSION: Placement of tunneled hemodialysis catheter via the right internal jugular vein. The catheter tip lies in the right atrium. The catheter is ready for immediate use. Electronically Signed   By: Irish Lack M.D.   On: 06/23/2018 17:23   Ir Removal Tun Cv Cath W/o Fl  Result Date: 06/30/2018 INDICATION: Patient with acute kidney injury status post dialysis catheter placement on 06/23/2018. Patient with improvement, no longer in need of dialysis. Request is made for removal of tunneled central venous catheter. EXAM: REMOVAL TUNNELED CENTRAL VENOUS CATHETER MEDICATIONS: None ANESTHESIA/SEDATION: None FLUOROSCOPY TIME:  None COMPLICATIONS: None immediate. PROCEDURE: Informed written consent was obtained from the patient after a thorough discussion of the procedural risks, benefits and alternatives. All questions were addressed. Maximal Sterile Barrier Technique was utilized including caps, mask, sterile gowns, sterile gloves, sterile drape, hand hygiene and skin antiseptic. A timeout was performed prior to the initiation of the procedure. The patient's right chest and catheter was prepped and draped in a normal sterile fashion. Heparin was removed from both ports of catheter. Using manual traction the cuff of the catheter was exposed and the catheter was removed in it's entirety. Pressure was held till hemostasis was obtained. A sterile dressing was applied. The patient tolerated the procedure well with no immediate complications. IMPRESSION: Successful catheter removal as described above. Read by: Loyce Dys PA-C Electronically Signed   By: Richarda Overlie M.D.   On: 06/30/2018 14:43   Ir US Guide Vasc Access Right  Result Date: 06/23/2018 CLINICAL DATA:  Acute renal failure requiring hemodialysis. EXAM: TUNNELED CENTRAL VENOUS HEMODIALYSIS CATHETER PLACEMENT WITH ULTRASOUND AND  FLUOROSCOPIC GUIDANCE ANESTHESIA/SEDATION: 1.5 mg IV Versed; 75 mcg IV Fentanyl. Total Moderate Sedation Time:   25 minutes. The patient's level of consciousness and physiologic status were continuously monitored during the procedure by Radiology nursing. MEDICATIONS: 2 g IV Ancef. FLUOROSCOPY TIME:  30 seconds.  6.6 mGy. PROCEDURE: The procedure, risks, benefits, and alternatives were explained to the patient. Questions regarding the procedure were encouraged and answered. The patient understands and consents to the procedure. A timeout was performed prior to initiating the procedure. The right neck and chest were prepped with chlorhexidine in a sterile fashion, and a sterile drape was applied covering the operative field. Maximum barrier sterile technique with sterile gowns and gloves were used for the procedure. Local anesthesia was provided with 1% lidocaine. Ultrasound was used to confirm patency of the right internal jugular vein. After creating a small venotomy incision, a 21 gauge needle was advanced into the right internal jugular vein under direct, real-time ultrasound guidance. Ultrasound image documentation was performed. After securing guidewire access, an 8 Fr dilator was placed. A J-wire was kinked to measure appropriate catheter length. A Palindrome tunneled hemodialysis catheter measuring 23 cm from tip to cuff was chosen for placement. This was tunneled in a retrograde fashion from the chest wall to the venotomy incision.  At the venotomy, serial dilatation was performed and a 16 Fr peel-away sheath was placed over a guidewire. The catheter was then placed through the sheath and the sheath removed. Final catheter positioning was confirmed and documented with a fluoroscopic spot image. The catheter was aspirated, flushed with saline, and injected with appropriate volume heparin dwells. The venotomy incision was closed with subcuticular 4-0 Vicryl. Dermabond was applied to the incision. The catheter  exit site was secured with 0-Prolene retention sutures. COMPLICATIONS: None.  No pneumothorax. FINDINGS: After catheter placement, the tip lies in the right atrium. The catheter aspirates normally and is ready for immediate use. IMPRESSION: Placement of tunneled hemodialysis catheter via the right internal jugular vein. The catheter tip lies in the right atrium. The catheter is ready for immediate use. Electronically Signed   By: Irish Lack M.D.   On: 06/23/2018 17:23   Ct Renal Stone Study  Result Date: 06/20/2018 CLINICAL DATA:  Acute right flank pain. EXAM: CT ABDOMEN AND PELVIS WITHOUT CONTRAST TECHNIQUE: Multidetector CT imaging of the abdomen and pelvis was performed following the standard protocol without IV contrast. COMPARISON:  CT scan of October 02, 2012. FINDINGS: Lower chest: No acute abnormality. Hepatobiliary: No focal liver abnormality is seen. No gallstones, gallbladder wall thickening, or biliary dilatation. Pancreas: Unremarkable. No pancreatic ductal dilatation or surrounding inflammatory changes. Spleen: Normal in size without focal abnormality. Adrenals/Urinary Tract: Adrenal glands are unremarkable. Kidneys are normal, without renal calculi, focal lesion, or hydronephrosis. Bladder is unremarkable. Stomach/Bowel: Stomach is within normal limits. Appendix appears normal. No evidence of bowel wall thickening, distention, or inflammatory changes. Vascular/Lymphatic: No significant vascular findings are present. No enlarged abdominal or pelvic lymph nodes. Reproductive: Prostate is unremarkable. Other: No abdominal wall hernia or abnormality. No abdominopelvic ascites. Musculoskeletal: No acute or significant osseous findings. IMPRESSION: No definite abnormality seen in the abdomen or pelvis. Electronically Signed   By: Lupita Raider, M.D.   On: 06/20/2018 19:19     Subjective: No chest pain or sob.   Discharge Exam: Vitals:   06/30/18 0509 06/30/18 0850  BP: (!) 146/94 (!)  142/93  Pulse: 74 74  Resp: 18 20  Temp: 97.6 F (36.4 C) 98.4 F (36.9 C)  SpO2: 98% 98%   Vitals:   06/29/18 2157 06/30/18 0509 06/30/18 0850 06/30/18 1047  BP: 129/85 (!) 146/94 (!) 142/93   Pulse: 75 74 74   Resp: 20 18 20    Temp: 98.7 F (37.1 C) 97.6 F (36.4 C) 98.4 F (36.9 C)   TempSrc: Oral Oral Oral   SpO2: 99% 98% 98%   Weight:    110.6 kg  Height:        General: Pt is alert, awake, not in acute distress Cardiovascular: RRR, S1/S2 +, no rubs, no gallops Respiratory: CTA bilaterally, no wheezing, no rhonchi Abdominal: Soft, NT, ND, bowel sounds + Extremities: no edema, no cyanosis    The results of significant diagnostics from this hospitalization (including imaging, microbiology, ancillary and laboratory) are listed below for reference.     Microbiology: No results found for this or any previous visit (from the past 240 hour(s)).   Labs: BNP (last 3 results) No results for input(s): BNP in the last 8760 hours. Basic Metabolic Panel: Recent Labs  Lab 06/26/18 1430 06/27/18 0612 06/28/18 0608 06/29/18 0335 06/30/18 0453  NA 141 143 142 141 141  K 3.6 3.9 3.9 3.6 3.9  CL 104 107 104 105 106  CO2 23 25 25 24  23  GLUCOSE 83 101* 117* 96 132*  BUN 67* 47* 53* 56* 62*  CREATININE 13.11* 10.23* 9.60* 8.13* 7.22*  CALCIUM 8.3* 8.5* 9.0 8.8* 8.8*  PHOS 4.7* 4.5 5.1* 6.0* 5.8*   Liver Function Tests: Recent Labs  Lab 06/26/18 1430 06/27/18 0612 06/28/18 0608 06/29/18 0335 06/30/18 0453  ALBUMIN 3.0* 2.9* 3.3* 3.2* 3.4*   No results for input(s): LIPASE, AMYLASE in the last 168 hours. No results for input(s): AMMONIA in the last 168 hours. CBC: Recent Labs  Lab 06/26/18 1400  WBC 5.1  HGB 14.8  HCT 44.6  MCV 89.9  PLT 216   Cardiac Enzymes: No results for input(s): CKTOTAL, CKMB, CKMBINDEX, TROPONINI in the last 168 hours. BNP: Invalid input(s): POCBNP CBG: No results for input(s): GLUCAP in the last 168 hours. D-Dimer No results  for input(s): DDIMER in the last 72 hours. Hgb A1c No results for input(s): HGBA1C in the last 72 hours. Lipid Profile No results for input(s): CHOL, HDL, LDLCALC, TRIG, CHOLHDL, LDLDIRECT in the last 72 hours. Thyroid function studies No results for input(s): TSH, T4TOTAL, T3FREE, THYROIDAB in the last 72 hours.  Invalid input(s): FREET3 Anemia work up No results for input(s): VITAMINB12, FOLATE, FERRITIN, TIBC, IRON, RETICCTPCT in the last 72 hours. Urinalysis    Component Value Date/Time   COLORURINE YELLOW 06/20/2018 2156   APPEARANCEUR CLOUDY (A) 06/20/2018 2156   LABSPEC 1.009 06/20/2018 2156   PHURINE 5.0 06/20/2018 2156   GLUCOSEU NEGATIVE 06/20/2018 2156   HGBUR MODERATE (A) 06/20/2018 2156   BILIRUBINUR NEGATIVE 06/20/2018 2156   KETONESUR NEGATIVE 06/20/2018 2156   PROTEINUR 100 (A) 06/20/2018 2156   UROBILINOGEN 0.2 10/02/2012 1307   NITRITE NEGATIVE 06/20/2018 2156   LEUKOCYTESUR LARGE (A) 06/20/2018 2156   Sepsis Labs Invalid input(s): PROCALCITONIN,  WBC,  LACTICIDVEN Microbiology No results found for this or any previous visit (from the past 240 hour(s)).   Time coordinating discharge: 34  minutes  SIGNED:   Kathlen ModyVijaya Ramonte Mena, MD  [facility] 07/02/2018, 10:56 AM Pager   If 7PM-7AM, please contact night-coverage [email or web address] Password [email or web address]

## 2018-07-07 DIAGNOSIS — N179 Acute kidney failure, unspecified: Secondary | ICD-10-CM | POA: Diagnosis not present

## 2018-07-13 DIAGNOSIS — N19 Unspecified kidney failure: Secondary | ICD-10-CM | POA: Diagnosis not present

## 2018-07-13 DIAGNOSIS — I10 Essential (primary) hypertension: Secondary | ICD-10-CM | POA: Diagnosis not present

## 2018-07-21 DIAGNOSIS — N179 Acute kidney failure, unspecified: Secondary | ICD-10-CM | POA: Diagnosis not present

## 2018-07-21 DIAGNOSIS — I10 Essential (primary) hypertension: Secondary | ICD-10-CM | POA: Diagnosis not present

## 2018-08-15 ENCOUNTER — Other Ambulatory Visit: Payer: Self-pay | Admitting: *Deleted

## 2018-08-15 DIAGNOSIS — I712 Thoracic aortic aneurysm, without rupture, unspecified: Secondary | ICD-10-CM

## 2018-08-23 DIAGNOSIS — N529 Male erectile dysfunction, unspecified: Secondary | ICD-10-CM | POA: Diagnosis not present

## 2018-08-23 DIAGNOSIS — E7800 Pure hypercholesterolemia, unspecified: Secondary | ICD-10-CM | POA: Diagnosis not present

## 2018-08-23 DIAGNOSIS — I10 Essential (primary) hypertension: Secondary | ICD-10-CM | POA: Diagnosis not present

## 2018-09-18 DIAGNOSIS — N179 Acute kidney failure, unspecified: Secondary | ICD-10-CM | POA: Diagnosis not present

## 2018-09-18 DIAGNOSIS — I10 Essential (primary) hypertension: Secondary | ICD-10-CM | POA: Diagnosis not present

## 2018-09-24 NOTE — Progress Notes (Signed)
Cardiology Office Note:    Date:  09/25/2018   ID:  [name], DOB Oct 02, 1966, MRN [id number]  PCP:  Asencion Gowda.August Saucer, MD  Cardiologist:  Lesleigh Noe, MD   Referring MD: Asencion Gowda.August Saucer, MD   Chief Complaint  Patient presents with  . Thoracic Aortic Aneurysm  . Hypertension    History of Present Illness:    Bryan Riggs is a 52 y.o. male with a hx of essential hypertension, sinus of Valsalva aneurysm, recurring chest discomfort, and hyperlipidemia.  Has not had any cardiac events since I last saw him a year ago.  He denies dyspnea, back pain, and chest pain of consequence.  No exertional discomfort.  He did have a hospital admission in August 2019 that was felt to be related to depression and anxiety surrounding financial difficulties.  He took a large quantity of Aleve, sleeping pills, and 1/2 cup of antifreeze mixed him [facility].  He developed acute kidney injury with creatinine rising to 9.2.  He required dialysis temporarily.  He was ultimately discharged from the hospital after a 10-day stay.  He has lost 25 pounds.  He complains of erectile dysfunction and that sildenafil is not helping.  While hospitalized with acute kidney failure his medication regimen was changed and hydralazine/Imdur were started.  He queries about the use of Viagra in the setting.  Past Medical History:  Diagnosis Date  . Aberrant subclavian artery    RIGHT...PER CT CHEST...07/20/13  . AKI (acute kidney injury) (HCC) 06/2018  . 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 Chest pain CT ANGIO CHEST 07/20/13   LED TO DISCOVERY OF SINUS OF VALSALVA  ANEURYSM  . Erectile dysfunction    DR. NESI  . History of kidney stones   . Hypertension   . Kidney stone     Past Surgical History:  Procedure Laterality Date  . IR FLUORO GUIDE CV LINE RIGHT  06/23/2018  . IR REMOVAL TUN CV CATH W/O FL  06/30/2018  . IR US GUIDE VASC ACCESS RIGHT   06/23/2018  . KNEE ARTHROPLASTY    . NO PAST SURGERIES    . ROTATOR CUFF REPAIR Right     Current Medications: Current Meds  Medication Sig  . amLODipine (NORVASC) 10 MG tablet Take 1 tablet (10 mg total) by mouth daily.  . hydrALAZINE (APRESOLINE) 10 MG tablet Take 10 mg by mouth 3 (three) times daily.  . metoprolol succinate (TOPROL-XL) 100 MG 24 hr tablet Take 1 tablet (100 mg total) by mouth daily. Take with or immediately following a meal.  . [DISCONTINUED] isosorbide mononitrate (IMDUR) 60 MG 24 hr tablet Take 1 tablet (60 mg total) by mouth daily.  . [DISCONTINUED] sildenafil (VIAGRA) 100 MG tablet TAKE 1 TABLET(100 MG) BY MOUTH DAILY AS NEEDED FOR ERECTILE DYSFUNCTION     Allergies:   Sulfasalazine; Sulfa antibiotics; and Viagra [sildenafil citrate]   Social History   Socioeconomic History  . Marital status: Married    Spouse name: Not on file  . Number of children: Not on file  . Years of education: Not on file  . Highest education level: Not on file  Occupational History  . Not on file  Social Needs  . Financial resource strain: Not on file  . Food insecurity:    Worry: Not on file    Inability: Not on file  . Transportation needs:    Medical: Not on file  Non-medical: Not on file  Tobacco Use  . Smoking status: Never Smoker  . Smokeless tobacco: Never Used  Substance and Sexual Activity  . Alcohol use: Yes    Comment: RARE  . Drug use: No  . Sexual activity: Not on file  Lifestyle  . Physical activity:    Days per week: Not on file    Minutes per session: Not on file  . Stress: Not on file  Relationships  . Social connections:    Talks on phone: Not on file    Gets together: Not on file    Attends religious service: Not on file    Active member of club or organization: Not on file    Attends meetings of clubs or organizations: Not on file    Relationship status: Not on file  Other Topics Concern  . Not on file  Social History Narrative  . Not on  file     Family History: The patient's family history includes Cancer in his brother, father, and mother; Hypertension in his father.  ROS:   Please see the history of present illness.    He is decreased intake of carbohydrates.  Denies syncope.  States that he has relieved much of the stress that was causing difficulty 3 months ago.  He is sleeping well.  All other systems reviewed and are negative.  EKGs/Labs/Other Studies Reviewed:    The following studies were reviewed today: No new cardiovascular studies done since last office visit with cardiology.  EKG:  EKG performed on be on August 06/21/2018 demonstrated sinus rhythm with poor R wave progression V1 through V4 and no acute change.   Recent Labs: 06/21/2018: Magnesium 2.1 06/23/2018: ALT 20 06/26/2018: Hemoglobin 14.8; Platelets 216 06/30/2018: BUN 62; Creatinine, Ser 7.22; Potassium 3.9; Sodium 141  Recent Lipid Panel No results found for: CHOL, TRIG, HDL, CHOLHDL, VLDL, LDLCALC, LDLDIRECT  Physical Exam:    VS:  BP 112/84   Pulse 68   Ht 6' (1.829 m)   Wt 240 lb 3.2 oz (109 kg)   SpO2 95%   BMI 32.58 kg/m     Wt Readings from Last 3 Encounters:  09/25/18 240 lb 3.2 oz (109 kg)  06/30/18 243 lb 12.8 oz (110.6 kg)  10/12/17 255 lb (115.7 kg)     GEN:  Well nourished, well developed in no acute distress HEENT: Normal NECK: No JVD. LYMPHATICS: No lymphadenopathy CARDIAC: RRR, no murmur, no gallop, no edema. VASCULAR: 2+ bilateral carotid, radial, and posterior tibial pulses.  No bruits. RESPIRATORY:  Clear to auscultation without rales, wheezing or rhonchi  ABDOMEN: Soft, non-tender, non-distended, No pulsatile mass, MUSCULOSKELETAL: No deformity  SKIN: Warm and dry NEUROLOGIC:  Alert and oriented x 3 PSYCHIATRIC:  Normal affect   ASSESSMENT:    1. 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 Essential hypertension   3. Aberrant subclavian artery   4. Atypical chest pain   5. Erectile  dysfunction due to arterial insufficiency   6. Abnormal EKG    PLAN:    In order of problems listed above:  1. Will have a CT performed within the next week in front of his appointment with Dr. Evelene Croon. 2. Blood pressure is excellently controlled.  Target less than 130/80 mmHg.  Will discontinue isosorbide mononitrate and recheck blood pressure and 1 month.  Continue low-dose Norvasc, hydralazine, and Toprol-XL if blood pressure increases could further increase Toprol dose to 150 mg or 200 mg/day. 3. Noted.  No action required. 4. Stable without any recent change in intermittent presents.  No specific evaluation is required. 5. Erectile dysfunction.  After he discontinues isosorbide, he will be okay to resume use of Viagra.  New prescription is written #10 with 6 refills.  Gust interaction between nitrates and PDE 5 inhibitor therapy if used concomitantly causing severe hypotension. 6. There are decreased anterior forces on the current EKG V1 through V4.  When compared with the most recent tracing in August 2017, no significant change is noted.  Greater than 50% of the time during this office visit was spent in education, counseling, and coordination of care related to underlying disease process and testing as outlined.  Interaction between PDE 5 and long-acting nitrates was discussed in detail.  Surveillance for change in mood/suicidal ideation reassessed and was unremarkable.  Will need to come back for blood pressure reassessment since isosorbide is being discontinued.  He needed to understand that isosorbide has impact on blood pressure.  We want to target less than 130/80 mmHg.  1 month follow-up with team member to reassess blood pressure off nitrates.   Medication Adjustments/Labs and Tests Ordered: Current medicines are reviewed at length with the patient today.  Concerns regarding medicines are outlined above.  No orders of the defined types were placed in this encounter.  Meds  ordered this encounter  Medications  . DISCONTD: sildenafil (VIAGRA) 100 MG tablet    Sig: Take 1 tablet (100 mg total) by mouth daily as needed for erectile dysfunction.    Dispense:  10 tablet    Refill:  0  . sildenafil (VIAGRA) 100 MG tablet    Sig: Take 1 tablet (100 mg total) by mouth daily as needed for erectile dysfunction.    Dispense:  10 tablet    Refill:  6    Patient Instructions  Medication Instructions:  Your physician has recommended you make the following change in your medication:   STOP: isosorbide mononitrate (imdur)  A prescription has been sent in for Viagra. Take as directed.   If you need a refill on your cardiac medications before your next appointment, please call your pharmacy.   Lab work: None ordered If you have labs (blood work) drawn today and your tests are completely normal, you will receive your results only by: Marland Kitchen MyChart Message (if you have MyChart) OR . A paper copy in the mail If you have any lab test that is abnormal or we need to change your treatment, we will call you to review the results.  Testing/Procedures: None ordered  Follow-Up: Your physician recommends that you schedule a follow-up appointment in: 1 months with an APP on Dr. Michaelle Copas team.   At [facility], you and your health needs are our priority.  As part of our continuing mission to provide you with exceptional heart care, we have created designated Provider Care Teams.  These Care Teams include your primary Cardiologist (physician) and Advanced Practice Providers (APPs -  Physician Assistants and Nurse Practitioners) who all work together to provide you with the care you need, when you need it. . You will need a follow up appointment in 1 year.  Please call our office 2 months in advance to schedule this appointment.  You may see Lesleigh Noe, MD or one of the following Advanced Practice Providers on your designated Care Team:   . Norma Fredrickson, NP . Nada Boozer,  NP . Georgie Chard, NP   Any Other Special Instructions Will Be  Listed Below (If Applicable).     Signed, Lesleigh Noe, MD  09/25/2018 1:59 PM    [facility]

## 2018-09-25 ENCOUNTER — Ambulatory Visit: Payer: BLUE CROSS/BLUE SHIELD | Admitting: Interventional Cardiology

## 2018-09-25 ENCOUNTER — Encounter (INDEPENDENT_AMBULATORY_CARE_PROVIDER_SITE_OTHER): Payer: Self-pay

## 2018-09-25 ENCOUNTER — Encounter: Payer: Self-pay | Admitting: Interventional Cardiology

## 2018-09-25 VITALS — BP 112/84 | HR 68 | Ht 72.0 in | Wt 240.2 lb

## 2018-09-25 DIAGNOSIS — I10 Essential (primary) hypertension: Secondary | ICD-10-CM | POA: Diagnosis not present

## 2018-09-25 DIAGNOSIS — N5201 Erectile dysfunction due to arterial insufficiency: Secondary | ICD-10-CM

## 2018-09-25 DIAGNOSIS — Q278 Other specified congenital malformations of peripheral vascular system: Secondary | ICD-10-CM | POA: Diagnosis not present

## 2018-09-25 DIAGNOSIS — Q2543 Congenital aneurysm of aorta: Secondary | ICD-10-CM | POA: Diagnosis not present

## 2018-09-25 DIAGNOSIS — R0789 Other chest pain: Secondary | ICD-10-CM | POA: Diagnosis not present

## 2018-09-25 DIAGNOSIS — R9431 Abnormal electrocardiogram [ECG] [EKG]: Secondary | ICD-10-CM

## 2018-09-25 MED ORDER — SILDENAFIL CITRATE 100 MG PO TABS: 100.0000 mg | ORAL_TABLET | Freq: Every day | ORAL | 0 refills | Status: DC | PRN

## 2018-09-25 MED ORDER — SILDENAFIL CITRATE 100 MG PO TABS: 100.0000 mg | ORAL_TABLET | Freq: Every day | ORAL | 6 refills | Status: DC | PRN

## 2018-09-25 NOTE — Patient Instructions (Signed)
Medication Instructions:  Your physician has recommended you make the following change in your medication:   STOP: isosorbide mononitrate (imdur)  A prescription has been sent in for Viagra. Take as directed.   If you need a refill on your cardiac medications before your next appointment, please call your pharmacy.   Lab work: None ordered If you have labs (blood work) drawn today and your tests are completely normal, you will receive your results only by: Marland Kitchen MyChart Message (if you have MyChart) OR . A paper copy in the mail If you have any lab test that is abnormal or we need to change your treatment, we will call you to review the results.  Testing/Procedures: None ordered  Follow-Up: Your physician recommends that you schedule a follow-up appointment in: 1 months with an APP on Dr. Michaelle Copas team.   At [facility], you and your health needs are our priority.  As part of our continuing mission to provide you with exceptional heart care, we have created designated Provider Care Teams.  These Care Teams include your primary Cardiologist (physician) and Advanced Practice Providers (APPs -  Physician Assistants and Nurse Practitioners) who all work together to provide you with the care you need, when you need it. . You will need a follow up appointment in 1 year.  Please call our office 2 months in advance to schedule this appointment.  You may see Lesleigh Noe, MD or one of the following Advanced Practice Providers on your designated Care Team:   . Norma Fredrickson, NP . Nada Boozer, NP . Georgie Chard, NP   Any Other Special Instructions Will Be Listed Below (If Applicable).

## 2018-09-27 ENCOUNTER — Ambulatory Visit (INDEPENDENT_AMBULATORY_CARE_PROVIDER_SITE_OTHER): Payer: BLUE CROSS/BLUE SHIELD | Admitting: Surgery

## 2018-09-27 ENCOUNTER — Encounter: Payer: Self-pay | Admitting: Surgery

## 2018-09-27 ENCOUNTER — Ambulatory Visit
Admission: RE | Admit: 2018-09-27 | Discharge: 2018-09-27 | Disposition: A | Discharge status: Home / Self Care | Payer: BLUE CROSS/BLUE SHIELD | Source: Ambulatory Visit | Attending: Surgery | Admitting: Surgery

## 2018-09-27 VITALS — BP 135/94 | HR 60 | Resp 20 | Ht 72.0 in | Wt 234.0 lb

## 2018-09-27 DIAGNOSIS — I712 Thoracic aortic aneurysm, without rupture, unspecified: Secondary | ICD-10-CM

## 2018-09-27 DIAGNOSIS — Z8679 Personal history of other diseases of the circulatory system: Secondary | ICD-10-CM | POA: Diagnosis not present

## 2018-09-27 DIAGNOSIS — Q2543 Congenital aneurysm of aorta: Secondary | ICD-10-CM

## 2018-09-27 MED ORDER — IOPAMIDOL (ISOVUE-370) INJECTION 76%
75.0000 mL | Freq: Once | INTRAVENOUS | Status: AC | PRN
  Administered 2018-09-27: 75 mL via INTRAVENOUS

## 2018-09-27 NOTE — Progress Notes (Signed)
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  I last saw him on 10/12/2017.  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  His last echocardiogram on 07/21/2017 showed normal left ventricular systolic function with ejection fraction of 55 to 60%.  The aortic valve is trileaflet with normal thickness leaflets and normal mobility.  There is no stenosis and trivial regurgitation.  The aortic root was measured at 46 mm.  He denies any chest pain or pressure.  Current Outpatient Medications  Medication Sig Dispense Refill  . amLODipine (NORVASC) 10 MG tablet Take 1 tablet (10 mg total) by mouth daily. 30 tablet 3  . hydrALAZINE (APRESOLINE) 10 MG tablet Take 10 mg by mouth 3 (three) times daily.    . metoprolol succinate (TOPROL-XL) 100 MG 24 hr tablet Take 1 tablet (100 mg total) by mouth daily. Take with or immediately following a meal. 30 tablet 0  . sildenafil (VIAGRA) 100 MG tablet Take 1 tablet (100 mg total) by mouth daily as needed for erectile dysfunction. 10 tablet 6   No current facility-administered medications for this visit.      Physical Exam: BP (!) 135/94   Pulse 60   Resp 20   Ht 6' (1.829 m)   Wt 234 lb (106.1 kg)   SpO2 98% Comment: RA  BMI 31.74 kg/m  He looks well. Cardiac exam shows a regular rate and rhythm with normal heart sounds.  There is no murmur. Lungs are clear.  Diagnostic Tests:  CLINICAL DATA:  52 year old male with a history of thoracic aortic aneurysm. Follow-up evaluation.  EXAM: CT ANGIOGRAPHY CHEST WITH CONTRAST  TECHNIQUE: Multidetector CT imaging of the chest was performed using the standard protocol during bolus administration of intravenous contrast. Multiplanar CT image reconstructions and MIPs were obtained to evaluate the vascular anatomy.  CONTRAST:  75mL ISOVUE-370 IOPAMIDOL (ISOVUE-370) INJECTION 76%  COMPARISON:  Prior CTA of the chest  10/12/2017  FINDINGS: Cardiovascular: Stable ectasia of the aortic root measuring 4.5-4.6 cm at the level of the sinuses of Valsalva. No effacement of the sino-tubular junction. The ascending, transverse and descending thoracic aorta are otherwise normal in caliber. Variant arch anatomy. The left and right common carotid arteries share a common origin. The left vertebral artery arises directly from the aorta and there is an aberrant right subclavian artery. No significant atherosclerotic plaque or evidence of dissection. The heart is normal in size. No pericardial effusion. Normal pulmonary venous drainage.  Mediastinum/Nodes: Unremarkable CT appearance of the thyroid gland. No suspicious mediastinal or hilar adenopathy. No soft tissue mediastinal mass. The thoracic esophagus is unremarkable.  Lungs/Pleura: Lungs are clear. No pleural effusion or pneumothorax.  Upper Abdomen: No acute abnormality.  Musculoskeletal: No acute fracture or aggressive appearing lytic or blastic osseous lesion.  Review of the MIP images confirms the above findings.  IMPRESSION: 1. Stable ectasia of the aortic root with a maximal diameter of 4.5-4.6 cm at the sinuses of Valsalva. 2. Variant aortic arch anatomy again noted including a common origin of the common carotid arteries, direct origin of the left vertebral artery from the arch and an aberrant right subclavian artery.  Signed,  Sterling Big, MD, RPVI  [facility]  [facility]   Electronically Signed   By: Malachy Moan M.D.   On: 09/27/2018 14:31   Impression:  He has a stable 4.5 to 4.6  cm aortic root aneurysm at the sinus level.  This has been stable dating back to 2012 when it was 4.6 cm.  It is below the surgical threshold of 5.5 cm.  I reviewed the CT images with him and answered his questions.  I stressed the importance of good blood pressure control and  preventing enlargement of the aneurysm and aortic dissection.  I advised him against discontinuing any of his blood pressure medications without discussing it with his physician.  Plan:  He will return to see me in 1 year with a CTA of the chest.  I spent 15 minutes performing this established patient evaluation and > 50% of this time was spent face to face counseling and coordinating the care of this patient's aortic aneurysm.    Alleen BorneBryan K Sacha Radloff, MD [facility] [phone]

## 2018-10-30 NOTE — Progress Notes (Signed)
Cardiology Office Note   Date:  10/31/2018   ID:  Bryan SignsHarold J Enns, DOB Feb 08, 1966, MRN [id number]  PCP:  Asencion GowdaMitchell, L.August Saucerean, MD  Cardiologist:  Dr. Katrinka BlazingSmith     No chief complaint on file.     History of Present Illness: Bryan Riggs is a 52 y.o. male who presents for BP eval.   He has a hx of essential hypertension, sinus of Valsalva aneurysm, recurring chest discomfort, and hyperlipidemia.   He did have a hospital admission in August 2019 that was felt to be related to depression and anxiety surrounding financial difficulties.  He took a large quantity of Aleve, sleeping pills, and 1/2 cup of antifreeze mixed him [facility].  He developed acute kidney injury with creatinine rising to 9.2.  He required dialysis temporarily.  He was ultimately discharged from the hospital after a 10-day stay.  He had lost 25 pounds on last visit.  He complains of erectile dysfunction and that sildenafil is not helping.  While hospitalized with acute kidney failure his medication regimen was changed and hydralazine/Imdur were started.  He queries about the use of Viagra in the setting.   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  I last saw him on 10/12/2017.  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  His last echocardiogram on 07/21/2017 showed normal left ventricular systolic function with ejection fraction of 55 to 60%.  The aortic valve is trileaflet with normal thickness leaflets and normal mobility.  There is no stenosis and trivial regurgitation.  The aortic root was measured at 46 mm.  He denies any chest pain or pressure. Followed by Dr. Laneta SimmersBartle  Isosorbide stopped  Back for BPfollow up  Yesterday he forgot to take meds.  Overall he states he does not miss any meds.just forgot to take.  He has no chest pain and no SOB.  Watching his diet.  Kidney function with Cr 1.97 thru PCP.   He has a headache but also congestion.  We discussed importance of his  meds.  He is working out with aerobics no weight training.       Past Medical History:  Diagnosis Date  . Aberrant subclavian artery    RIGHT...PER CT CHEST...07/20/13  . AKI (acute kidney injury) (HCC) 06/2018  . 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 Chest pain CT ANGIO CHEST 07/20/13   LED TO DISCOVERY OF SINUS OF VALSALVA  ANEURYSM  . Erectile dysfunction    DR. NESI  . History of kidney stones   . Hypertension   . Kidney stone     Past Surgical History:  Procedure Laterality Date  . IR FLUORO GUIDE CV LINE RIGHT  06/23/2018  . IR REMOVAL TUN CV CATH W/O FL  06/30/2018  . IR US GUIDE VASC ACCESS RIGHT  06/23/2018  . KNEE ARTHROPLASTY    . NO PAST SURGERIES    . ROTATOR CUFF REPAIR Right      Current Outpatient Medications  Medication Sig Dispense Refill  . amLODipine (NORVASC) 10 MG tablet Take 1 tablet (10 mg total) by mouth daily. 30 tablet 3  . hydrALAZINE (APRESOLINE) 10 MG tablet Take 10 mg by mouth 3 (three) times daily.    . metoprolol succinate (TOPROL-XL) 100 MG 24 hr tablet Take 1 tablet (100 mg total) by mouth daily. Take with or immediately following a meal. 30 tablet 0  .  sildenafil (VIAGRA) 100 MG tablet Take 1 tablet (100 mg total) by mouth daily as needed for erectile dysfunction. 10 tablet 6   No current facility-administered medications for this visit.     Allergies:   Sulfasalazine; Sulfa antibiotics; and Viagra [sildenafil citrate]    Social History:  The patient  reports that he has never smoked. He has never used smokeless tobacco. He reports current alcohol use. He reports that he does not use drugs.   Family History:  The patient's family history includes Cancer in his brother, father, and mother; Hypertension in his father.    ROS:  General:no colds or fevers, no weight changes Skin:no rashes or ulcers HEENT:no blurred vision, no congestion CV:see HPI PUL:see HPI GI:no diarrhea  constipation or melena, no indigestion GU:no hematuria, no dysuria MS:no joint pain, no claudication Neuro:no syncope, no lightheadedness Endo:no diabetes, no thyroid disease  Wt Readings from Last 3 Encounters:  10/31/18 242 lb 12.8 oz (110.1 kg)  09/27/18 234 lb (106.1 kg)  09/25/18 240 lb 3.2 oz (109 kg)     PHYSICAL EXAM: VS:  BP (!) 146/90   Pulse (!) 58   Ht 5' 11.5" (1.816 m)   Wt 242 lb 12.8 oz (110.1 kg)   SpO2 98%   BMI 33.39 kg/m  , BMI Body mass index is 33.39 kg/m. General:Pleasant affect, NAD Skin:Warm and dry, brisk capillary refill HEENT:normocephalic, sclera clear, mucus membranes moist Heart:S1S2 RRR without murmur, gallup, rub or click Lungs:clear without rales, rhonchi, or wheezes ZOX:WRUE, non tender, + BS, do not palpate liver spleen or masses Ext:no lower ext edema,  Neuro:alert and oriented, MAE, follows commands, + facial symmetry    EKG:  EKG is NOT ordered today.   Recent Labs: 06/21/2018: Magnesium 2.1 06/23/2018: ALT 20 06/26/2018: Hemoglobin 14.8; Platelets 216 06/30/2018: BUN 62; Creatinine, Ser 7.22; Potassium 3.9; Sodium 141    Lipid Panel No results found for: CHOL, TRIG, HDL, CHOLHDL, VLDL, LDLCALC, LDLDIRECT     Other studies Reviewed: Additional studies/ records that were reviewed today include: . Echo 07/21/17 Study Conclusions  - Left ventricle: The cavity size was normal. There was mild   concentric hypertrophy. Systolic function was normal. The   estimated ejection fraction was in the range of 55% to 60%. Wall   motion was normal; there were no regional wall motion   abnormalities. Doppler parameters are consistent with abnormal   left ventricular relaxation (grade 1 diastolic dysfunction). - Aortic valve: There was trivial regurgitation. - Left atrium: The atrium was mildly dilated. - Right ventricle: The cavity size was mildly dilated. Wall   thickness was normal.  Impressions:  - PA pressure could not be  estimated, but 2D findings suggest   possible pulmonary HTN.  ASSESSMENT AND PLAN:  1.  Hypertension, elevated today though he stated at home his BP is lower, he did miss meds yesterday.  Continue current meds and have him come back in 1-2 weeks to recheck BP.  2.  Aneurysm of aortic sinus of Valsalva - followed by Dr. Laneta Simmers and stable  3. Hx of aberrant subclavian artery.   Follow up with Dr. Katrinka Blazing in 6 months.    Current medicines are reviewed with the patient today.  The patient Has no concerns regarding medicines.  The following changes have been made:  See above Labs/ tests ordered today include:see above  Disposition:   FU:  see above  Signed, Nada Boozer, NP  10/31/2018 10:21 AM    [facility] [place], [address] [place], [place] Phone: [phone]; Fax: [phone]

## 2018-10-31 ENCOUNTER — Ambulatory Visit: Payer: BLUE CROSS/BLUE SHIELD | Admitting: Cardiology

## 2018-10-31 ENCOUNTER — Encounter (INDEPENDENT_AMBULATORY_CARE_PROVIDER_SITE_OTHER): Payer: Self-pay

## 2018-10-31 ENCOUNTER — Encounter: Payer: Self-pay | Admitting: Cardiology

## 2018-10-31 VITALS — BP 146/90 | HR 58 | Ht 71.5 in | Wt 242.8 lb

## 2018-10-31 DIAGNOSIS — Q278 Other specified congenital malformations of peripheral vascular system: Secondary | ICD-10-CM

## 2018-10-31 DIAGNOSIS — I10 Essential (primary) hypertension: Secondary | ICD-10-CM | POA: Diagnosis not present

## 2018-10-31 DIAGNOSIS — Q2543 Congenital aneurysm of aorta: Secondary | ICD-10-CM

## 2018-10-31 NOTE — Patient Instructions (Signed)
Medication Instructions:  Your physician recommends that you continue on your current medications as directed. Please refer to the Current Medication list given to you today.  If you need a refill on your cardiac medications before your next appointment, please call your pharmacy.   Lab work: None  If you have labs (blood work) drawn today and your tests are completely normal, you will receive your results only by: Marland Kitchen. MyChart Message (if you have MyChart) OR . A paper copy in the mail If you have any lab test that is abnormal or we need to change your treatment, we will call you to review the results.  Testing/Procedures: None  Follow-Up: At [facility], you and your health needs are our priority.  As part of our continuing mission to provide you with exceptional heart care, we have created designated Provider Care Teams.  These Care Teams include your primary Cardiologist (physician) and Advanced Practice Providers (APPs -  Physician Assistants and Nurse Practitioners) who all work together to provide you with the care you need, when you need it. You will need a follow up appointment in 6 months.  Please call our office 2 months in advance to schedule this appointment.  You may see Lesleigh NoeHenry W Smith III, MD or one of the following Advanced Practice Providers on your designated Care Team:   Norma FredricksonLori Gerhardt, NP Nada BoozerLaura Ingold, NP . Georgie ChardJill McDaniel, NP  Follow up for a blood pressure recheck in 2 weeks    Any Other Special Instructions Will Be Listed Below (If Applicable).

## 2018-11-14 ENCOUNTER — Ambulatory Visit (INDEPENDENT_AMBULATORY_CARE_PROVIDER_SITE_OTHER): Payer: BLUE CROSS/BLUE SHIELD | Admitting: *Deleted

## 2018-11-14 VITALS — BP 168/92 | HR 83 | Ht 71.0 in | Wt 243.0 lb

## 2018-11-14 DIAGNOSIS — I10 Essential (primary) hypertension: Secondary | ICD-10-CM

## 2018-11-14 NOTE — Progress Notes (Signed)
1.) Reason for visit: BP Recheck per Bryan BoozerLaura Ingold NP  2.) Name of MD requesting visit: Bryan BoozerLaura Ingold NP/ Dr. Katrinka Riggs pt as well  3.) H&P: Hypertension  4.) ROS related to problem: Pt here today for follow-up of BP recheck, as ordered by Bryan BoozerLaura Ingold NP from last OV with the pt on 10/31/18.  At last OV with Bryan RiegerLaura, pt had presented to that visit and forgot to take his medications and his BP was elevated at that visit at 146/90 and HR-58 bpm.  Pt was ordered at that visit to take his all his medications (hydralazine 10 mg po TID, amlodipine 10 mg po daily, and Toprol XL 100 mg po daily, and to REMAIN off of Isosorbide mononitrate, for Dr Bryan Riggs discontinued this med on 09/25/18 and gave him a script for Viagra 100 mg po daily as needed for ED).  Pts vital signs today at his nurse visit was BP-168/92 and HR-83 bpm.  Pt reports he has not taken ANY of his prescribed medications, except for discontinued Isosorbide mononitrate, for they were all causing him to have a headache. Pt states he has NOT taken any of his prescribed medications for 2 days now, but did take his D/C'ed isosorbide mononitrate.  Other than complaints of a headache today, he has no other cardiac complaints like chest pain, sob, dizziness, pre-syncopal or syncopal episodes.  Reiterated to the pt that he was to STOP taking his Imdur back at his last OV with Dr Bryan Riggs on 09/25/18, for he was prescribed Viagra at that visit, and he was to take only his hydralazine, amlodipine, and Toprol XL.  Pt states he got his meds mixed up.  Endorsed to the pt that he must STOP taking his Imdur, for this is contraindicated and very unsafe with taking Viagra.  Went to the DOD Dr. Elberta Riggs, and per Dr Bryan Riggs, he advised that the pt STOP taking his Imdur as previously advised, take his prescribed medications as is, and report back for another nurse visit BP check in one week while taking his prescribed medications.  Endorsed this to the pt and reiterated once again the  safety concern with taking discontinued Imdur with his viagra, and not taking his prescribed BP and heart medications.  Pt verbalized understanding and agreed with this plan.  Will forward this note to ordering Provider, Bryan BoozerLaura Ingold NP.   Pt was taken to checkout where his  nurse visit BP recheck in one week  was scheduled for 11/22/18 at 1100.   5.) Assessment and plan per MD: STOP taking previously discontinued Imdur, start back taking prescribed medications (hydralazine, amlodipine, and Toprol XL), and report back for nurse visit BP check in one week, while taking prescribed cardiac meds per DOD Dr. Elberta Riggs.

## 2018-11-14 NOTE — Patient Instructions (Signed)
Medication Instructions:   STOP TAKING IMDUR (ISOSORBIDE MONONITRATE) NOW, FOR THIS IS CONTRAINDICATED WITH TAKING YOUR AS NEEDED VIAGRA  PLEASE START BACK TAKING YOUR OTHER CARDIAC MEDICATIONS AS PREVIOUSLY ADVISED BY [name] NP ON 10/31/18     Follow-Up:  NURSE VISIT BP CHECK IN ONE WEEK WHILE TAKING YOUR PRESCRIBED CARDIAC MEDICATIONS       If you need a refill on your cardiac medications before your next appointment, please call your pharmacy.

## 2018-11-22 ENCOUNTER — Ambulatory Visit (INDEPENDENT_AMBULATORY_CARE_PROVIDER_SITE_OTHER): Payer: BLUE CROSS/BLUE SHIELD | Admitting: *Deleted

## 2018-11-22 VITALS — BP 164/100 | HR 72 | Ht 71.5 in | Wt 242.0 lb

## 2018-11-22 DIAGNOSIS — I10 Essential (primary) hypertension: Secondary | ICD-10-CM

## 2018-11-22 DIAGNOSIS — Z79899 Other long term (current) drug therapy: Secondary | ICD-10-CM | POA: Diagnosis not present

## 2018-11-22 MED ORDER — METOPROLOL SUCCINATE ER 200 MG PO TB24: 200.0000 mg | ORAL_TABLET | Freq: Every day | ORAL | 6 refills | Status: DC

## 2018-11-22 NOTE — Patient Instructions (Addendum)
Your physician has recommended you make the following change in your medication:  STOP HYDRALAZINE INCREASE METOPROLOL TO 200 MG EVERY DAY   Your physician recommends that you return for lab work in:  BMET IN 1 MONTH Your physician recommends that you schedule a follow-up appointment in:  1 MONTH  B/P CLINIC

## 2018-11-22 NOTE — Progress Notes (Signed)
Pt came in for b/p check  LA 158/102 and RA 164/100  Heart rate 72. Per Dr Katrinka Blazing have pt stop Hydralazine 10 mg and increase Metoprolol to 200 mg every day  And follow up in B/p clinic in 1 month with bmet same day .Bryan Riggs

## 2018-12-25 ENCOUNTER — Ambulatory Visit (INDEPENDENT_AMBULATORY_CARE_PROVIDER_SITE_OTHER): Payer: BLUE CROSS/BLUE SHIELD | Admitting: Pharmacist

## 2018-12-25 ENCOUNTER — Other Ambulatory Visit: Payer: BLUE CROSS/BLUE SHIELD | Admitting: *Deleted

## 2018-12-25 ENCOUNTER — Encounter: Payer: Self-pay | Admitting: Pharmacist

## 2018-12-25 VITALS — BP 148/100 | HR 59

## 2018-12-25 DIAGNOSIS — I10 Essential (primary) hypertension: Secondary | ICD-10-CM

## 2018-12-25 DIAGNOSIS — Z79899 Other long term (current) drug therapy: Secondary | ICD-10-CM

## 2018-12-25 LAB — BASIC METABOLIC PANEL
BUN/Creatinine Ratio: 11 (ref 9–20)
BUN: 19 mg/dL (ref 6–24)
CO2: 23 mmol/L (ref 20–29)
Calcium: 9.4 mg/dL (ref 8.7–10.2)
Chloride: 105 mmol/L (ref 96–106)
Creatinine, Ser: 1.68 mg/dL — ABNORMAL HIGH (ref 0.76–1.27)
GFR calc Af Amer: 53 mL/min/{1.73_m2} — ABNORMAL LOW (ref >59)
GFR calc non Af Amer: 46 mL/min/{1.73_m2} — ABNORMAL LOW (ref >59)
Glucose: 93 mg/dL (ref 65–99)
Potassium: 4.2 mmol/L (ref 3.5–5.2)
Sodium: 142 mmol/L (ref 134–144)

## 2018-12-25 MED ORDER — LISINOPRIL 10 MG PO TABS: 10.0000 mg | ORAL_TABLET | Freq: Every day | ORAL | 11 refills | Status: DC

## 2018-12-25 NOTE — Progress Notes (Signed)
Patient ID: Bryan Riggs                 DOB: 1966-10-02                      MRN: [id number]     HPI: Bryan SignsHarold J Hornbaker is a 53 y.o. male referred by Dr. Katrinka BlazingSmith to HTN clinic. PMH is significant for essential hypertension, sinus of Valsalva aneurysm, recurring chest discomfort, and hyperlipidemia and AKI. Patient last seen on 11/22/2018 where his blood pressure was 164/100 HR 72. He was instructed to stop his hydralazine and increase metoprolol to 200mg  daily.    Patient presents today for blood pressure management. Patient denies lightheadedness, headaches, blurred vision, fatigue, SOB or swelling. States that he gets dizzy sometimes for about 5-10 min. States it is not associated with changing positions, no falls. He states he is 100% compliant with his medications.  BMP from this morning is pending. Last Scr prior to patient AKI was around 1.9.   Current HTN meds: amlodipine 10mg  daily, metoprolol 200mg  daily Previously tried: lisinopril/HCTZ (AKI), imdur (prescribed viagra, headaches) BP goal: <130/80  Family History: The patient's family history includes Cancer in his brother, father, and mother; Hypertension in his father.  Social History: -tobacco, occassional ETOH  Diet: no salt, no frozen-watches Na intake, no soda, ocassional tea/coffee  Exercise: gym/resistance training  Home BP readings: states top number is 150-160's/ sometimes 100 or 120. Does not check everyday and did not bring log  Wt Readings from Last 3 Encounters:  11/22/18 242 lb (109.8 kg)  11/14/18 243 lb (110.2 kg)  10/31/18 242 lb 12.8 oz (110.1 kg)   BP Readings from Last 3 Encounters:  11/22/18 (!) 164/100  11/14/18 (!) 168/92  10/31/18 (!) 146/90   Pulse Readings from Last 3 Encounters:  11/22/18 72  11/14/18 83  10/31/18 (!) 58    Renal function: CrCl cannot be calculated (Patient's most recent lab result is older than the maximum 21 days allowed.).  Past Medical History:  Diagnosis Date    . Aberrant subclavian artery    RIGHT...PER CT CHEST...07/20/13  . AKI (acute kidney injury) (HCC) 06/2018  . 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 Chest pain CT ANGIO CHEST 07/20/13   LED TO DISCOVERY OF SINUS OF VALSALVA  ANEURYSM  . Erectile dysfunction    DR. NESI  . History of kidney stones   . Hypertension   . Kidney stone     Current Outpatient Medications on File Prior to Visit  Medication Sig Dispense Refill  . amLODipine (NORVASC) 10 MG tablet Take 1 tablet (10 mg total) by mouth daily. 30 tablet 3  . hydrALAZINE (APRESOLINE) 10 MG tablet Take 10 mg by mouth 3 (three) times daily.    . metoprolol succinate (TOPROL-XL) 200 MG 24 hr tablet Take 1 tablet (200 mg total) by mouth daily. Take with or immediately following a meal. 90 tablet 6  . sildenafil (VIAGRA) 100 MG tablet Take 1 tablet (100 mg total) by mouth daily as needed for erectile dysfunction. 10 tablet 6   No current facility-administered medications on file prior to visit.     Allergies  Allergen Reactions  . Sulfasalazine Nausea And Vomiting  . Sulfa Antibiotics Rash and Nausea And Vomiting    Break out, itching  . Viagra [Sildenafil Citrate] Other (See Comments)    FLUSHING     There were  no vitals taken for this visit.   Assessment/Plan:  1. Hypertension - Blood pressure above goal of <130/80. A diastolic reading 147100 and above does concern me, however patient denies any symptoms currently. Due to his resting heart rate being <60 and complaints of occassional dizziness, will decrease metoprolol to 100mg  daily. Patient scr is down to 1.68 which appears to be close to his baseline prior to his AKI. K 4.2. I believe it would be safe to resume lisinopril with close monitoring. Will start lisinopril 10mg  daily. Follow up labs and BP check in 3 weeks.  Thank you  Olene FlossMelissa D Maccia, Pharm.D, BCPS [facility]  [address],  [place], [place] [address]  Phone: [phone]; Fax: [phone]

## 2018-12-25 NOTE — Patient Instructions (Addendum)
Decrease your metoprolol dose to 100mg  daily. You may cut the 200mg  tablets in half.  I will call you this afternoon or tomorrow morning with your blood work results and then we can discuss which new medication we will add for your blood pressure.  Call us at [phone] with any questions or concerns.

## 2018-12-27 DIAGNOSIS — N401 Enlarged prostate with lower urinary tract symptoms: Secondary | ICD-10-CM | POA: Diagnosis not present

## 2018-12-27 DIAGNOSIS — N3943 Post-void dribbling: Secondary | ICD-10-CM | POA: Diagnosis not present

## 2018-12-27 DIAGNOSIS — R3914 Feeling of incomplete bladder emptying: Secondary | ICD-10-CM | POA: Diagnosis not present

## 2018-12-27 DIAGNOSIS — R351 Nocturia: Secondary | ICD-10-CM | POA: Diagnosis not present

## 2019-01-16 NOTE — Progress Notes (Signed)
Patient ID: Bryan Riggs                 DOB: 02-07-1966                      MRN: [id number]     HPI: Bryan Riggs is a 53 y.o. male referred by Dr. Katrinka Blazing to HTN clinic. PMH is significant for essential hypertension, sinus of Valsalva aneurysm, recurring chest discomfort, and hyperlipidemia and AKI. Patient last seen on 12/25/2018 where his blood pressure was 148/100 HR 59. He was instructed to decrease metoprolol to 100mg  daily due to low HR and dizziness and start lisinopril 10mg  daily with close monitoring of scr.  Patient presents today for blood pressure management. He states that his blood pressure is much better and he feels great. Denies lightheadedness, headaches, blurred vision. States that he is tired due to lack of sleep. He did not take his medications this morning because he was rushing out of the house. Patient will need BMP today to check scr and K.  He does have a home blood pressure meter. States his blood pressure has been in the 130's/80's.  Current HTN meds: amlodipine 10mg  daily, metoprolol 100mg  daily and lisinopril 10mg  daily Previously tried: lisinopril/HCTZ (AKI), imdur (prescribed viagra, headaches) BP goal: <130/80  Family History: The patient's family history includes Cancer in his brother, father, and mother; Hypertension in his father.  Social History: -tobacco, occassional ETOH  Diet: no salt, no frozen food-watches Na intake, no soda, ocassional tea/coffee  Exercise: gym/resistance training  Home BP readings: 131/86  Wt Readings from Last 3 Encounters:  11/22/18 242 lb (109.8 kg)  11/14/18 243 lb (110.2 kg)  10/31/18 242 lb 12.8 oz (110.1 kg)   BP Readings from Last 3 Encounters:  12/25/18 (!) 148/100  11/22/18 (!) 164/100  11/14/18 (!) 168/92   Pulse Readings from Last 3 Encounters:  12/25/18 (!) 59  11/22/18 72  11/14/18 83    Renal function: CrCl cannot be calculated (Patient's most recent lab result is older than the maximum 21  days allowed.).  Past Medical History:  Diagnosis Date  . Aberrant subclavian artery    RIGHT...PER CT CHEST...07/20/13  . AKI (acute kidney injury) (HCC) 06/2018  . 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 Chest pain CT ANGIO CHEST 07/20/13   LED TO DISCOVERY OF SINUS OF VALSALVA  ANEURYSM  . Erectile dysfunction    DR. NESI  . History of kidney stones   . Hypertension   . Kidney stone     Current Outpatient Medications on File Prior to Visit  Medication Sig Dispense Refill  . amLODipine (NORVASC) 10 MG tablet Take 1 tablet (10 mg total) by mouth daily. 30 tablet 3  . hydrALAZINE (APRESOLINE) 10 MG tablet Take 10 mg by mouth 3 (three) times daily.    Marland Kitchen lisinopril (PRINIVIL,ZESTRIL) 10 MG tablet Take 1 tablet (10 mg total) by mouth daily. 30 tablet 11  . metoprolol succinate (TOPROL-XL) 100 MG 24 hr tablet Take 1 tablet (100 mg total) by mouth daily. Take with or immediately following a meal. 90 tablet 3  . sildenafil (VIAGRA) 100 MG tablet Take 1 tablet (100 mg total) by mouth daily as needed for erectile dysfunction. 10 tablet 6   No current facility-administered medications on file prior to visit.     Allergies  Allergen Reactions  . Sulfasalazine Nausea And Vomiting  .  Sulfa Antibiotics Rash and Nausea And Vomiting    Break out, itching  . Viagra [Sildenafil Citrate] Other (See Comments)    FLUSHING     There were no vitals taken for this visit.   Assessment/Plan:  1. Hypertension - Blood pressure is above goal of <130/80, however patient did not take his medications this morning. He normally takes them around 0630 and it is now 1015. Will not make any changes today. His blood pressure is much improved from previous. Patient is to continue to check blood pressure at home and follow up in 2 weeks. Will check BMET today. Patient asked to make sure he takes his medications prior to next visit.  Thank you  Olene Floss, Pharm.D, BCPS [facility]  [address], [place], [place] [address]  Phone: [phone]; Fax: [phone]

## 2019-01-17 ENCOUNTER — Ambulatory Visit (INDEPENDENT_AMBULATORY_CARE_PROVIDER_SITE_OTHER): Payer: BLUE CROSS/BLUE SHIELD | Admitting: Pharmacist

## 2019-01-17 ENCOUNTER — Encounter: Payer: Self-pay | Admitting: Pharmacist

## 2019-01-17 VITALS — BP 130/90 | HR 73

## 2019-01-17 DIAGNOSIS — I10 Essential (primary) hypertension: Secondary | ICD-10-CM | POA: Diagnosis not present

## 2019-01-17 LAB — BASIC METABOLIC PANEL
BUN/Creatinine Ratio: 13 (ref 9–20)
BUN: 25 mg/dL — AB (ref 6–24)
CALCIUM: 9.5 mg/dL (ref 8.7–10.2)
CHLORIDE: 103 mmol/L (ref 96–106)
CO2: 23 mmol/L (ref 20–29)
Creatinine, Ser: 1.86 mg/dL — ABNORMAL HIGH (ref 0.76–1.27)
GFR calc Af Amer: 47 mL/min/{1.73_m2} — ABNORMAL LOW (ref >59)
GFR calc non Af Amer: 40 mL/min/{1.73_m2} — ABNORMAL LOW (ref >59)
GLUCOSE: 75 mg/dL (ref 65–99)
Potassium: 3.6 mmol/L (ref 3.5–5.2)
Sodium: 142 mmol/L (ref 134–144)

## 2019-01-17 NOTE — Patient Instructions (Addendum)
Continue amlodipine 10mg  daily, metoprolol 100mg  daily and lisinopril 10mg  daily.  Continue checking blood pressure at home. If your bottom number is >90 consistently give me a call.  Call [phone] with any questions or concerns.  Make sure to take your medications prior to your next visit.

## 2019-01-18 ENCOUNTER — Telehealth: Payer: Self-pay | Admitting: Pharmacist

## 2019-01-18 NOTE — Telephone Encounter (Signed)
Called patient, let him know there was a slight bump in his kidney numbers, but overall no need to be concerned. Will continue to monitor closely. Will recheck in about a month. Continue lisinopril.

## 2019-01-29 ENCOUNTER — Telehealth: Payer: Self-pay

## 2019-01-29 NOTE — Telephone Encounter (Signed)
Called and rescheduled a pt due to covid19 concerns pt was compliant

## 2019-01-29 NOTE — Telephone Encounter (Signed)
-----   Message from Awilda Metro, [facility] sent at 01/29/2019  9:41 AM EDT ----- Can move blood pressure visit out 4 weeks

## 2019-01-31 ENCOUNTER — Ambulatory Visit: Payer: BLUE CROSS/BLUE SHIELD

## 2019-02-21 DIAGNOSIS — N529 Male erectile dysfunction, unspecified: Secondary | ICD-10-CM | POA: Diagnosis not present

## 2019-02-21 DIAGNOSIS — I10 Essential (primary) hypertension: Secondary | ICD-10-CM | POA: Diagnosis not present

## 2019-02-23 ENCOUNTER — Telehealth: Payer: Self-pay | Admitting: Pharmacist

## 2019-02-23 NOTE — Telephone Encounter (Signed)
Spoke with patient. Canceled in office visit for Monday, but will complete phone visit Monday around 10:00 as patient does not have his readings available at this time.

## 2019-02-26 ENCOUNTER — Encounter: Payer: Self-pay | Admitting: Pharmacist

## 2019-02-26 ENCOUNTER — Telehealth (INDEPENDENT_AMBULATORY_CARE_PROVIDER_SITE_OTHER): Payer: Self-pay | Admitting: Pharmacist

## 2019-02-26 ENCOUNTER — Ambulatory Visit: Payer: BLUE CROSS/BLUE SHIELD

## 2019-02-26 DIAGNOSIS — I10 Essential (primary) hypertension: Secondary | ICD-10-CM

## 2019-02-26 MED ORDER — LISINOPRIL 20 MG PO TABS: 20.0000 mg | ORAL_TABLET | Freq: Every day | ORAL | 11 refills | Status: DC

## 2019-02-26 NOTE — Progress Notes (Signed)
Patient ID: Bryan Riggs                 DOB: 12-Aug-1966                      MRN: [id number]     HPI: Bryan Riggs is a 53 y.o. male referred by Dr. Katrinka BlazingSmith to HTN clinic. Visit performed via telehealth due to COVID-19. PMH is significant for HTN, HLD, AKI, and reoccurring chest discomfort.   Patient reports feeling well overall. He is under more stress lately as he is in the process of getting divorced. He takes his BP medications between 6 and 7am. Home readings mostly low 130s/upper 80s-low 90s, recent reading of 132/93. He typically works out at [employer] gym but has been walking more recently since the gyms are closed due to COVID-19.  Current HTN meds: amlodipine 10mg  daily, lisinopril 10mg  daily, metoprolol succinate 100mg  daily Previously tried: metoprolol - bradycardia and dizziness, Imdur (headaches), lisinopril-HCTZ (AKI) BP goal: <130/280mmHg  Family History: The patient'sfamily history includes Cancer in his brother, father, and mother; Hypertension in his father.  Social History: No tobacco use, occasional alcohol use.  Diet: Does not add salt to his food or eat frozen food, no soda, occasional tea or coffee.  Exercise: Gym, resistance training.  Home BP readings: 130s/80-90s  Wt Readings from Last 3 Encounters:  11/22/18 242 lb (109.8 kg)  11/14/18 243 lb (110.2 kg)  10/31/18 242 lb 12.8 oz (110.1 kg)   BP Readings from Last 3 Encounters:  01/17/19 130/90  12/25/18 (!) 148/100  11/22/18 (!) 164/100   Pulse Readings from Last 3 Encounters:  01/17/19 73  12/25/18 (!) 59  11/22/18 72    Renal function: CrCl cannot be calculated (Patient's most recent lab result is older than the maximum 21 days allowed.).  Past Medical History:  Diagnosis Date  . Aberrant subclavian artery    RIGHT...PER CT CHEST...07/20/13  . AKI (acute kidney injury) (HCC) 06/2018  . 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 Chest pain  CT ANGIO CHEST 07/20/13   LED TO DISCOVERY OF SINUS OF VALSALVA  ANEURYSM  . Erectile dysfunction    DR. NESI  . History of kidney stones   . Hypertension   . Kidney stone     Current Outpatient Medications on File Prior to Visit  Medication Sig Dispense Refill  . amLODipine (NORVASC) 10 MG tablet Take 1 tablet (10 mg total) by mouth daily. 30 tablet 3  . lisinopril (PRINIVIL,ZESTRIL) 10 MG tablet Take 1 tablet (10 mg total) by mouth daily. 30 tablet 11  . metoprolol succinate (TOPROL-XL) 100 MG 24 hr tablet Take 1 tablet (100 mg total) by mouth daily. Take with or immediately following a meal. 90 tablet 3  . sildenafil (VIAGRA) 100 MG tablet Take 1 tablet (100 mg total) by mouth daily as needed for erectile dysfunction. 10 tablet 6   No current facility-administered medications on file prior to visit.     Allergies  Allergen Reactions  . Sulfasalazine Nausea And Vomiting  . Sulfa Antibiotics Rash and Nausea And Vomiting    Break out, itching  . Viagra [Sildenafil Citrate] Other (See Comments)    FLUSHING      Assessment/Plan:  1. Hypertension - BP readings remain elevated above goal <130/7780mmHg at home. Discussed switching from metoprolol to carvedilol, however pt does not wish to take a BID medication.  Will increase lisinopril to 20mg  daily and continue amlodipine 10mg  daily and metoprolol succinate 100mg  daily. Recheck BMET in 3 weeks at [facility]. Will f/u with pt on the phone to assess BP response and lab results.  Megan E. Supple, PharmD, BCACP, CPP [facility] [address], [place], [place] [address] Phone: [phone]; Fax: [phone] 02/26/2019 10:17 AM

## 2019-02-26 NOTE — Telephone Encounter (Signed)
This encounter was created in error - please disregard.

## 2019-03-28 ENCOUNTER — Other Ambulatory Visit: Payer: BLUE CROSS/BLUE SHIELD | Admitting: *Deleted

## 2019-03-28 ENCOUNTER — Other Ambulatory Visit: Payer: Self-pay

## 2019-03-28 DIAGNOSIS — I10 Essential (primary) hypertension: Secondary | ICD-10-CM | POA: Diagnosis not present

## 2019-03-29 ENCOUNTER — Telehealth: Payer: Self-pay | Admitting: Pharmacist

## 2019-03-29 DIAGNOSIS — I10 Essential (primary) hypertension: Secondary | ICD-10-CM

## 2019-03-29 LAB — BASIC METABOLIC PANEL
BUN/Creatinine Ratio: 13 (ref 9–20)
BUN: 27 mg/dL — ABNORMAL HIGH (ref 6–24)
CO2: 24 mmol/L (ref 20–29)
Calcium: 10 mg/dL (ref 8.7–10.2)
Chloride: 103 mmol/L (ref 96–106)
Creatinine, Ser: 2.03 mg/dL — ABNORMAL HIGH (ref 0.76–1.27)
GFR calc Af Amer: 42 mL/min/{1.73_m2} — ABNORMAL LOW (ref >59)
GFR calc non Af Amer: 36 mL/min/{1.73_m2} — ABNORMAL LOW (ref >59)
Glucose: 75 mg/dL (ref 65–99)
Potassium: 4.3 mmol/L (ref 3.5–5.2)
Sodium: 142 mmol/L (ref 134–144)

## 2019-03-29 NOTE — Telephone Encounter (Signed)
Called pt to follow up with BP readings. He reports feeling well overall, BP has improved to 126/80s since increasing lisinopril dose. SCr did bump slightly from 1.86 to 2.03 since increasing lisinopril. Pt is trying to drink 5-6 bottles of water a day and has not used any NSAIDs recently. WIll continue on current meds and recheck BMET again in 3 weeks to keep an eye on renal function. SCr was 1.68 prior to lisinopril start so he is right around ~15% SCr increase overall. Pt will continue to monitor his BP. Will call pt for HTN follow up after lab work results.

## 2019-04-16 ENCOUNTER — Telehealth: Payer: Self-pay | Admitting: *Deleted

## 2019-04-16 NOTE — Telephone Encounter (Signed)
    COVID-19 Pre-Screening Questions:  . In the past 7 to 10 days have you had a cough,  shortness of breath, headache, congestion, fever (100 or greater) body aches, chills, sore throat, or sudden loss of taste or sense of smell? . Have you been around anyone with known Covid 19. . Have you been around anyone who is awaiting Covid 19 test results in the past 7 to 10 days? . Have you been around anyone who has been exposed to Covid 19, or has mentioned symptoms of Covid 19 within the past 7 to 10 days?  If you have any concerns/questions about symptoms patients report during screening (either on the phone or at threshold). Contact the provider seeing the patient or DOD for further guidance.  If neither are available contact a member of the leadership team.   Contacted patient via telephone call. All Covid 19 questions were answered no. Patient has a mask. KB

## 2019-04-18 ENCOUNTER — Other Ambulatory Visit: Payer: Self-pay

## 2019-04-18 ENCOUNTER — Other Ambulatory Visit: Payer: BLUE CROSS/BLUE SHIELD | Admitting: *Deleted

## 2019-04-18 DIAGNOSIS — I10 Essential (primary) hypertension: Secondary | ICD-10-CM | POA: Diagnosis not present

## 2019-04-18 LAB — BASIC METABOLIC PANEL
BUN/Creatinine Ratio: 11 (ref 9–20)
BUN: 21 mg/dL (ref 6–24)
CO2: 23 mmol/L (ref 20–29)
Calcium: 9.6 mg/dL (ref 8.7–10.2)
Chloride: 101 mmol/L (ref 96–106)
Creatinine, Ser: 1.91 mg/dL — ABNORMAL HIGH (ref 0.76–1.27)
GFR calc Af Amer: 45 mL/min/{1.73_m2} — ABNORMAL LOW (ref >59)
GFR calc non Af Amer: 39 mL/min/{1.73_m2} — ABNORMAL LOW (ref >59)
Glucose: 74 mg/dL (ref 65–99)
Potassium: 3.9 mmol/L (ref 3.5–5.2)
Sodium: 142 mmol/L (ref 134–144)

## 2019-05-23 DIAGNOSIS — M545 Low back pain: Secondary | ICD-10-CM | POA: Diagnosis not present

## 2019-06-13 DIAGNOSIS — N179 Acute kidney failure, unspecified: Secondary | ICD-10-CM | POA: Diagnosis not present

## 2019-06-18 DIAGNOSIS — N183 Chronic kidney disease, stage 3 (moderate): Secondary | ICD-10-CM | POA: Diagnosis not present

## 2019-06-18 DIAGNOSIS — I10 Essential (primary) hypertension: Secondary | ICD-10-CM | POA: Diagnosis not present

## 2019-06-19 ENCOUNTER — Other Ambulatory Visit: Payer: Self-pay

## 2019-06-19 DIAGNOSIS — Z20822 Contact with and (suspected) exposure to covid-19: Secondary | ICD-10-CM

## 2019-06-20 LAB — NOVEL CORONAVIRUS, NAA: SARS-CoV-2, NAA: DETECTED — AB

## 2019-06-29 ENCOUNTER — Telehealth: Payer: Self-pay | Admitting: *Deleted

## 2019-06-29 NOTE — Telephone Encounter (Signed)
Pt called asking if he needs to be retested to go back to work. Gave him the guidelines for getting out if quarantine and to be retested for his job. He has been in quarantine for 10 days and never had a fever. His symptoms have decreased. He has a dry cough now. No headaches, muscle aches or loss of smell and taste now.  Advised that he wait for 14 days to be retested if needed for his job requirement. Retesting too soon could give another positive. He voiced understanding.

## 2019-07-02 ENCOUNTER — Other Ambulatory Visit: Payer: Self-pay

## 2019-07-02 DIAGNOSIS — Z20822 Contact with and (suspected) exposure to covid-19: Secondary | ICD-10-CM

## 2019-07-04 LAB — NOVEL CORONAVIRUS, NAA: SARS-CoV-2, NAA: NOT DETECTED

## 2019-08-23 DIAGNOSIS — I10 Essential (primary) hypertension: Secondary | ICD-10-CM | POA: Diagnosis not present

## 2019-08-23 DIAGNOSIS — N400 Enlarged prostate without lower urinary tract symptoms: Secondary | ICD-10-CM | POA: Diagnosis not present

## 2019-08-23 DIAGNOSIS — E7800 Pure hypercholesterolemia, unspecified: Secondary | ICD-10-CM | POA: Diagnosis not present

## 2019-08-23 DIAGNOSIS — N529 Male erectile dysfunction, unspecified: Secondary | ICD-10-CM | POA: Diagnosis not present

## 2019-09-06 ENCOUNTER — Other Ambulatory Visit: Payer: Self-pay | Admitting: Surgery

## 2019-09-06 DIAGNOSIS — I712 Thoracic aortic aneurysm, without rupture, unspecified: Secondary | ICD-10-CM

## 2019-09-07 IMAGING — US US SOFT TISSUE HEAD/NECK
1 series · 5 of 5 positions shown · non-contrast
Comparison: None.

CLINICAL DATA: 51-year-old male with neck soft tissue swelling

EXAM:
ULTRASOUND OF HEAD/NECK SOFT TISSUES
TECHNIQUE: Ultrasound examination of the head and neck soft tissues was
performed in the area of clinical concern.

[Series 1: us soft tissue head/neck · 0.10mm/px · 5 of 5 slices shown]
[im 1/5]
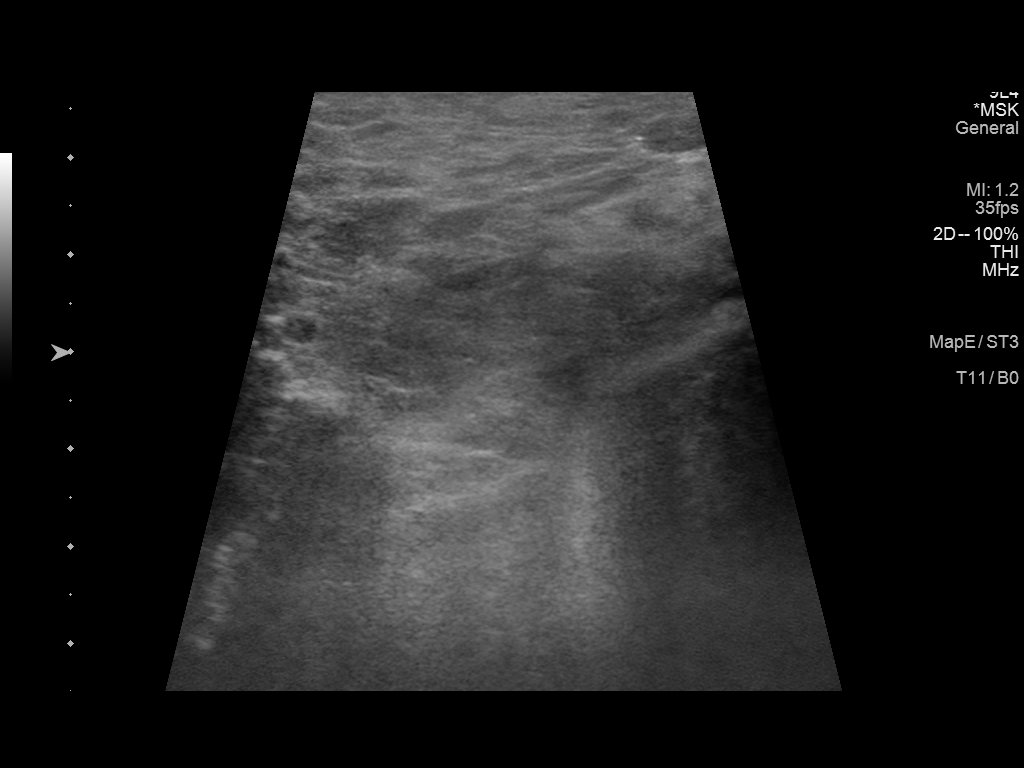
[im 2/5]
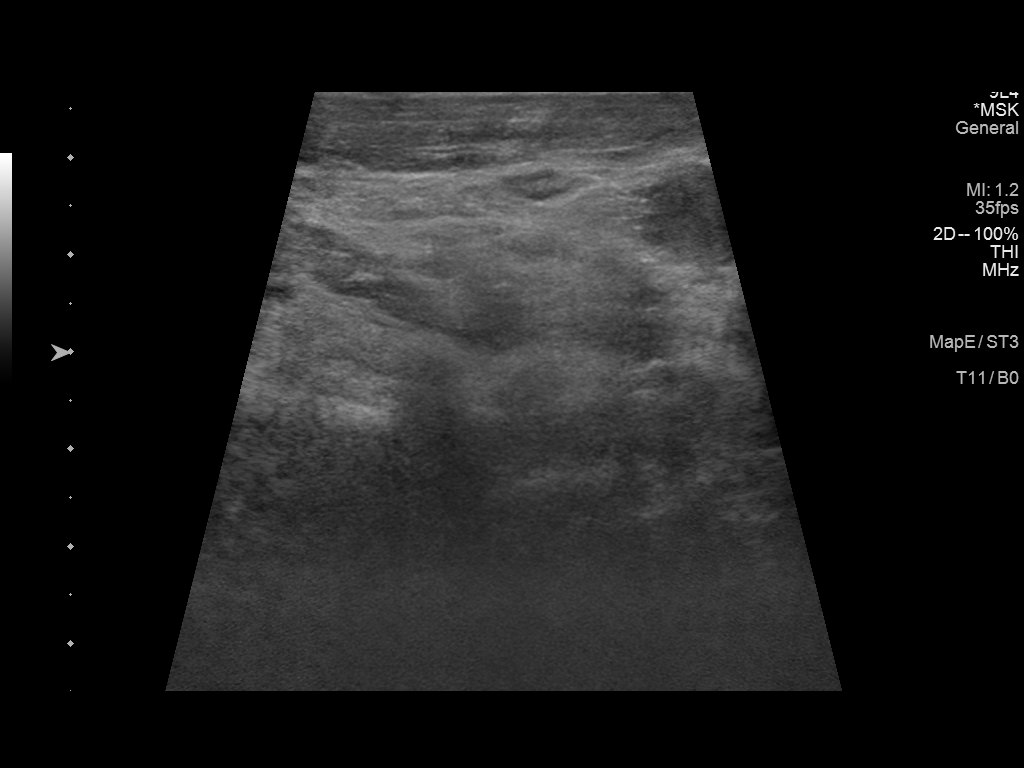
[im 3/5]
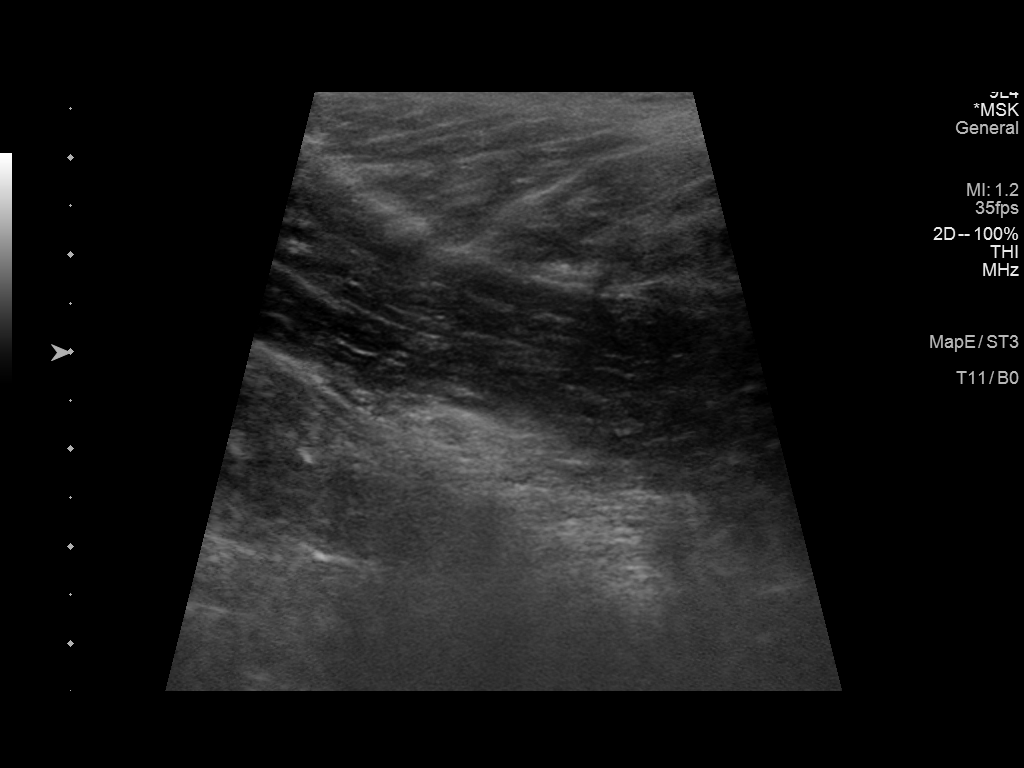
[im 4/5]
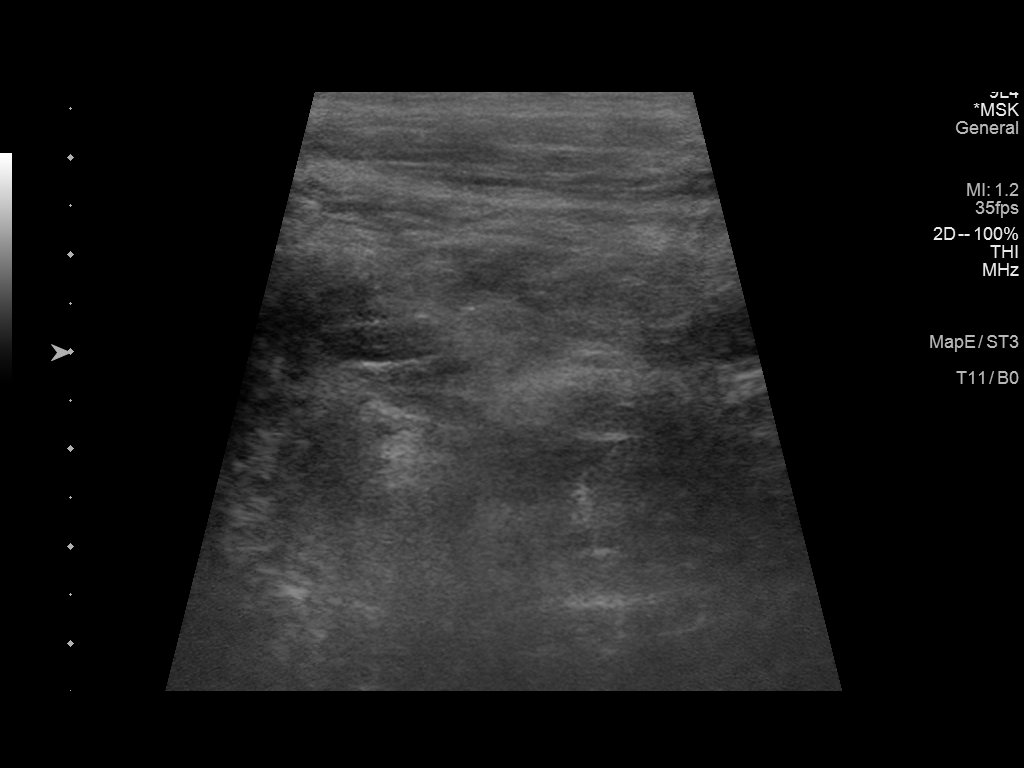
[im 5/5]
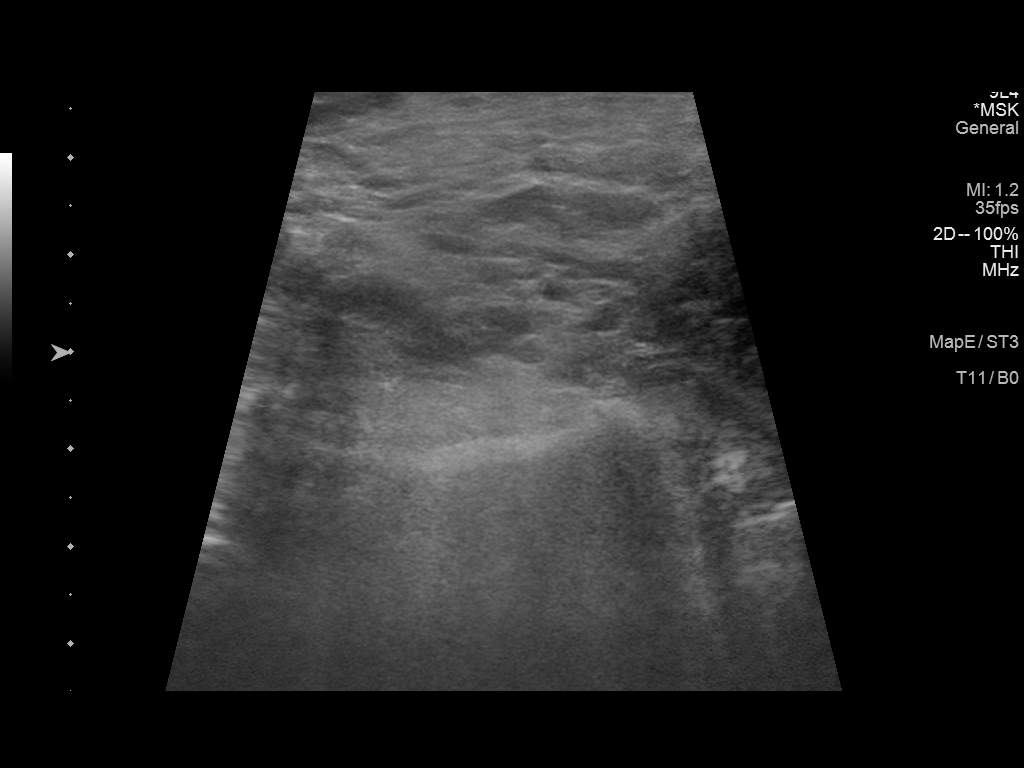

[5 of 5 positions shown; findings below may reference images not displayed]

FINDINGS: Grayscale and color duplex performed in the region of clinical
concern. No focal soft tissue swelling. No lesion identified.
IMPRESSION: Unremarkable sonographic survey in the region of clinical concern

## 2019-09-12 ENCOUNTER — Other Ambulatory Visit: Payer: Self-pay | Admitting: Surgery

## 2019-09-17 ENCOUNTER — Other Ambulatory Visit: Payer: Self-pay | Admitting: Surgery

## 2019-10-03 ENCOUNTER — Ambulatory Visit
Admission: RE | Admit: 2019-10-03 | Discharge: 2019-10-03 | Disposition: A | Discharge status: Home / Self Care | Payer: BC Managed Care – PPO | Source: Ambulatory Visit | Attending: Surgery | Admitting: Surgery

## 2019-10-03 ENCOUNTER — Encounter: Payer: Self-pay | Admitting: Surgery

## 2019-10-03 ENCOUNTER — Other Ambulatory Visit: Payer: Self-pay

## 2019-10-03 ENCOUNTER — Ambulatory Visit: Payer: BC Managed Care – PPO | Admitting: Surgery

## 2019-10-03 VITALS — BP 139/90 | HR 73 | Temp 97.6°F | Resp 20 | Ht 72.0 in | Wt 248.0 lb

## 2019-10-03 DIAGNOSIS — I712 Thoracic aortic aneurysm, without rupture, unspecified: Secondary | ICD-10-CM

## 2019-10-03 MED ORDER — IOPAMIDOL (ISOVUE-370) INJECTION 76%
75.0000 mL | Freq: Once | INTRAVENOUS | Status: AC | PRN
  Administered 2019-10-03: 75 mL via INTRAVENOUS

## 2019-10-03 NOTE — Progress Notes (Signed)
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  This has been stable dating back to 2012.  Since I last saw him 1 year ago he has been feeling well.  He reports occasional episode of left-sided chest discomfort which is not necessarily related to exertion.  He has no shortness of breath.  Current Outpatient Medications  Medication Sig Dispense Refill  . amLODipine (NORVASC) 10 MG tablet Take 1 tablet (10 mg total) by mouth daily. 30 tablet 3  . lisinopril (PRINIVIL,ZESTRIL) 20 MG tablet Take 1 tablet (20 mg total) by mouth daily. 30 tablet 11  . metoprolol succinate (TOPROL-XL) 100 MG 24 hr tablet Take 1 tablet (100 mg total) by mouth daily. Take with or immediately following a meal. 90 tablet 3  . sildenafil (VIAGRA) 100 MG tablet Take 1 tablet (100 mg total) by mouth daily as needed for erectile dysfunction. 10 tablet 6   No current facility-administered medications for this visit.      Physical Exam: BP 139/90   Pulse 73   Temp 97.6 F (36.4 C) (Skin)   Resp 20   Ht 6' (1.829 m)   Wt 248 lb (112.5 kg)   SpO2 96% Comment: RA  BMI 33.63 kg/m    Diagnostic Tests:  CLINICAL DATA:  Thoracic aortic prominence  EXAM: CT ANGIOGRAPHY CHEST WITH CONTRAST  TECHNIQUE: Multidetector CT imaging of the chest was performed using the standard protocol during bolus administration of intravenous contrast. Multiplanar CT image reconstructions and MIPs were obtained to evaluate the vascular anatomy.  CONTRAST:  69mL ISOVUE-370 IOPAMIDOL (ISOVUE-370) INJECTION 76%  COMPARISON:  September 27, 2018  FINDINGS: Cardiovascular: The measured transverse diameter at the sinuses of Valsalva measures 4.6 cm, unchanged from prior study. The sinotubular junction is not effaced. The ascending thoracic aortic diameter measures 3.9 x 3.8 cm. The descending thoracic aorta at the main pulmonary  outflow tract level measures 2.8 x 2.7 cm in diameter. There is no evident thoracic aortic dissection. The great vessels which are visualized are patent. Note that the right innominate and left common carotid arteries arise as a common trunk, an anatomic variant. The left vertebral artery arises directly from the aortic arch, an anatomic variant. 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 There is no pericardial effusion or pericardial thickening. There is a prominent hemiazygous vein. There is no demonstrable pulmonary embolus.  Mediastinum/Nodes: Thyroid appears unremarkable. No evident thoracic adenopathy. No esophageal lesions are evident.  Lungs/Pleura: No edema or consolidation. No pleural effusions. There is a stable 2 mm nodular opacity abutting the pleura in the anterior segment right upper lobe seen on axial slice 44 series 7.  Upper Abdomen: Visualized upper abdominal structures appear unremarkable.  Musculoskeletal: There are no blastic or lytic bone lesions. No chest wall lesions are evident.  Review of the MIP images confirms the above findings.  IMPRESSION: 1. Stable prominence at the sinuses of Valsalva with a measured transverse diameter of 4.6 cm. No dissection evident.  2. Anatomic variants arising from the aortic arch, stable. 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  3.  No demonstrable pulmonary embolus.  4. Stable 2 mm nodular opacity right upper lobe. No lung edema or consolidation. No pleural effusions.  5.  No evident thoracic adenopathy.  Electronically Signed   By: Bretta Bang III M.D.   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  This is unchanged dating back to 2012.  I reviewed the CTA images with him and  answered his questions.  I stressed the importance of continued good blood pressure control and preventing further enlargement and acute aortic dissection.  Since this has been stable since 2012 I think it is reasonable to wait 2 years to do a follow-up scan.  He is in agreement with that.  He has some intermittent left-sided chest discomfort which does not sound anginal and I do not think it is related to his aneurysm.  He is followed by Dr. Verdis Prime for his cardiology care but said that he missed his most recent appointment and will reschedule that.  Plan:  He will return to see me in 2 years with a CTA of the chest.  I spent 15 minutes performing this established patient evaluation and > 50% of this time was spent face to face counseling and coordinating the care of this patient's aortic aneurysm.    Alleen Borne, MD [facility] [phone]

## 2019-11-26 ENCOUNTER — Ambulatory Visit: Payer: BC Managed Care – PPO | Attending: Internal Medicine

## 2019-11-26 DIAGNOSIS — Z20822 Contact with and (suspected) exposure to covid-19: Secondary | ICD-10-CM | POA: Diagnosis not present

## 2019-11-27 LAB — NOVEL CORONAVIRUS, NAA: SARS-CoV-2, NAA: NOT DETECTED

## 2019-12-18 ENCOUNTER — Emergency Department (HOSPITAL_COMMUNITY): Payer: BC Managed Care – PPO

## 2019-12-18 ENCOUNTER — Encounter (HOSPITAL_COMMUNITY): Payer: Self-pay | Admitting: Emergency Medicine

## 2019-12-18 ENCOUNTER — Observation Stay (HOSPITAL_COMMUNITY)
Admission: EM | Admit: 2019-12-18 | Discharge: 2019-12-19 | Disposition: A | Discharge status: Home / Self Care | Payer: BC Managed Care – PPO | Attending: Cardiology | Admitting: Cardiology

## 2019-12-18 ENCOUNTER — Other Ambulatory Visit: Payer: Self-pay

## 2019-12-18 DIAGNOSIS — I160 Hypertensive urgency: Secondary | ICD-10-CM | POA: Diagnosis not present

## 2019-12-18 DIAGNOSIS — Q2549 Other congenital malformations of aorta: Secondary | ICD-10-CM | POA: Diagnosis not present

## 2019-12-18 DIAGNOSIS — I719 Aortic aneurysm of unspecified site, without rupture: Secondary | ICD-10-CM

## 2019-12-18 DIAGNOSIS — I129 Hypertensive chronic kidney disease with stage 1 through stage 4 chronic kidney disease, or unspecified chronic kidney disease: Secondary | ICD-10-CM | POA: Insufficient documentation

## 2019-12-18 DIAGNOSIS — Z20822 Contact with and (suspected) exposure to covid-19: Secondary | ICD-10-CM | POA: Diagnosis not present

## 2019-12-18 DIAGNOSIS — I708 Atherosclerosis of other arteries: Secondary | ICD-10-CM | POA: Insufficient documentation

## 2019-12-18 DIAGNOSIS — I10 Essential (primary) hypertension: Secondary | ICD-10-CM | POA: Diagnosis present

## 2019-12-18 DIAGNOSIS — Z882 Allergy status to sulfonamides status: Secondary | ICD-10-CM | POA: Insufficient documentation

## 2019-12-18 DIAGNOSIS — Q2543 Congenital aneurysm of aorta: Secondary | ICD-10-CM | POA: Diagnosis not present

## 2019-12-18 DIAGNOSIS — R079 Chest pain, unspecified: Secondary | ICD-10-CM | POA: Diagnosis not present

## 2019-12-18 DIAGNOSIS — Z79899 Other long term (current) drug therapy: Secondary | ICD-10-CM | POA: Insufficient documentation

## 2019-12-18 DIAGNOSIS — R200 Anesthesia of skin: Secondary | ICD-10-CM | POA: Diagnosis not present

## 2019-12-18 DIAGNOSIS — Z888 Allergy status to other drugs, medicaments and biological substances status: Secondary | ICD-10-CM | POA: Diagnosis not present

## 2019-12-18 DIAGNOSIS — E785 Hyperlipidemia, unspecified: Secondary | ICD-10-CM | POA: Diagnosis not present

## 2019-12-18 DIAGNOSIS — I119 Hypertensive heart disease without heart failure: Secondary | ICD-10-CM

## 2019-12-18 DIAGNOSIS — N1831 Chronic kidney disease, stage 3a: Secondary | ICD-10-CM

## 2019-12-18 DIAGNOSIS — M25512 Pain in left shoulder: Secondary | ICD-10-CM | POA: Diagnosis not present

## 2019-12-18 DIAGNOSIS — R072 Precordial pain: Secondary | ICD-10-CM | POA: Diagnosis not present

## 2019-12-18 DIAGNOSIS — I77810 Thoracic aortic ectasia: Secondary | ICD-10-CM | POA: Diagnosis not present

## 2019-12-18 LAB — BASIC METABOLIC PANEL
Anion gap: 9 (ref 5–15)
BUN: 19 mg/dL (ref 6–20)
CO2: 25 mmol/L (ref 22–32)
Calcium: 9.2 mg/dL (ref 8.9–10.3)
Chloride: 106 mmol/L (ref 98–111)
Creatinine, Ser: 1.59 mg/dL — ABNORMAL HIGH (ref 0.61–1.24)
GFR calc Af Amer: 56 mL/min — ABNORMAL LOW (ref >60)
GFR calc non Af Amer: 48 mL/min — ABNORMAL LOW (ref >60)
Glucose, Bld: 91 mg/dL (ref 70–99)
Potassium: 3.6 mmol/L (ref 3.5–5.1)
Sodium: 140 mmol/L (ref 135–145)

## 2019-12-18 LAB — CBC
HCT: 53 % — ABNORMAL HIGH (ref 39.0–52.0)
HCT: 50.9 % (ref 39.0–52.0)
Hemoglobin: 17.8 g/dL — ABNORMAL HIGH (ref 13.0–17.0)
Hemoglobin: 17.4 g/dL — ABNORMAL HIGH (ref 13.0–17.0)
MCH: 29.8 pg (ref 26.0–34.0)
MCH: 29.7 pg (ref 26.0–34.0)
MCHC: 33.6 g/dL (ref 30.0–36.0)
MCHC: 34.2 g/dL (ref 30.0–36.0)
MCV: 88.8 fL (ref 80.0–100.0)
MCV: 86.9 fL (ref 80.0–100.0)
Platelets: 211 10*3/uL (ref 150–400)
Platelets: 209 10*3/uL (ref 150–400)
RBC: 5.97 MIL/uL — ABNORMAL HIGH (ref 4.22–5.81)
RBC: 5.86 MIL/uL — ABNORMAL HIGH (ref 4.22–5.81)
RDW: 12.9 % (ref 11.5–15.5)
RDW: 12.8 % (ref 11.5–15.5)
WBC: 3 10*3/uL — ABNORMAL LOW (ref 4.0–10.5)
WBC: 2.8 10*3/uL — ABNORMAL LOW (ref 4.0–10.5)
nRBC: 0 % (ref 0.0–0.2)
nRBC: 0 % (ref 0.0–0.2)

## 2019-12-18 LAB — MAGNESIUM: Magnesium: 2.2 mg/dL (ref 1.7–2.4)

## 2019-12-18 LAB — TSH: TSH: 0.96 u[IU]/mL (ref 0.350–4.500)

## 2019-12-18 LAB — HEMOGLOBIN A1C
Hgb A1c MFr Bld: 5.3 % (ref 4.8–5.6)
Mean Plasma Glucose: 105.41 mg/dL

## 2019-12-18 LAB — CREATININE, SERUM
Creatinine, Ser: 1.66 mg/dL — ABNORMAL HIGH (ref 0.61–1.24)
GFR calc Af Amer: 53 mL/min — ABNORMAL LOW (ref >60)
GFR calc non Af Amer: 46 mL/min — ABNORMAL LOW (ref >60)

## 2019-12-18 LAB — HIV ANTIBODY (ROUTINE TESTING W REFLEX): HIV Screen 4th Generation wRfx: NONREACTIVE

## 2019-12-18 LAB — PROTIME-INR
INR: 1 (ref 0.8–1.2)
Prothrombin Time: 13 seconds (ref 11.4–15.2)

## 2019-12-18 LAB — SARS CORONAVIRUS 2 (TAT 6-24 HRS): SARS Coronavirus 2: NEGATIVE

## 2019-12-18 LAB — TROPONIN I (HIGH SENSITIVITY)
Troponin I (High Sensitivity): 6 ng/L (ref <18)
Troponin I (High Sensitivity): 6 ng/L (ref <18)

## 2019-12-18 MED ORDER — LABETALOL HCL 5 MG/ML IV SOLN
10.0000 mg | Freq: Once | INTRAVENOUS | Status: AC
  Administered 2019-12-18: 10 mg via INTRAVENOUS
  Filled 2019-12-18: qty 4

## 2019-12-18 MED ORDER — AMLODIPINE BESYLATE 10 MG PO TABS
10.0000 mg | ORAL_TABLET | Freq: Every day | ORAL | Status: DC
  Administered 2019-12-18 – 2019-12-19 (×2): 10 mg via ORAL
  Filled 2019-12-18 (×2): qty 1

## 2019-12-18 MED ORDER — LISINOPRIL 20 MG PO TABS
20.0000 mg | ORAL_TABLET | Freq: Every day | ORAL | Status: DC
  Administered 2019-12-18 – 2019-12-19 (×2): 20 mg via ORAL
  Filled 2019-12-18 (×2): qty 1

## 2019-12-18 MED ORDER — ACETAMINOPHEN 325 MG PO TABS: 650.0000 mg | ORAL_TABLET | ORAL | Status: DC | PRN

## 2019-12-18 MED ORDER — ONDANSETRON HCL 4 MG/2ML IJ SOLN: 4.0000 mg | Freq: Four times a day (QID) | INTRAMUSCULAR | Status: DC | PRN

## 2019-12-18 MED ORDER — ALPRAZOLAM 0.25 MG PO TABS: 0.2500 mg | ORAL_TABLET | Freq: Two times a day (BID) | ORAL | Status: DC | PRN

## 2019-12-18 MED ORDER — IOHEXOL 350 MG/ML SOLN
150.0000 mL | Freq: Once | INTRAVENOUS | Status: AC | PRN
  Administered 2019-12-18: 150 mL via INTRAVENOUS

## 2019-12-18 MED ORDER — SODIUM CHLORIDE 0.9% FLUSH
3.0000 mL | Freq: Once | INTRAVENOUS | Status: AC
  Administered 2019-12-18: 3 mL via INTRAVENOUS

## 2019-12-18 MED ORDER — SODIUM CHLORIDE 0.9% FLUSH
3.0000 mL | Freq: Two times a day (BID) | INTRAVENOUS | Status: DC
  Administered 2019-12-18: 3 mL via INTRAVENOUS

## 2019-12-18 MED ORDER — SODIUM CHLORIDE 0.9% FLUSH: 3.0000 mL | INTRAVENOUS | Status: DC | PRN

## 2019-12-18 MED ORDER — ASPIRIN 300 MG RE SUPP: 300.0000 mg | RECTAL | Status: AC

## 2019-12-18 MED ORDER — SODIUM CHLORIDE 0.9 % IV SOLN: 250.0000 mL | INTRAVENOUS | Status: DC | PRN

## 2019-12-18 MED ORDER — HYDROCHLOROTHIAZIDE 25 MG PO TABS
25.0000 mg | ORAL_TABLET | Freq: Every day | ORAL | Status: DC
  Administered 2019-12-18: 25 mg via ORAL
  Filled 2019-12-18: qty 1

## 2019-12-18 MED ORDER — ATORVASTATIN CALCIUM 40 MG PO TABS
40.0000 mg | ORAL_TABLET | Freq: Every day | ORAL | Status: DC
  Filled 2019-12-18: qty 1

## 2019-12-18 MED ORDER — HEPARIN SODIUM (PORCINE) 5000 UNIT/ML IJ SOLN
5000.0000 [IU] | Freq: Three times a day (TID) | INTRAMUSCULAR | Status: DC
  Administered 2019-12-19: 5000 [IU] via SUBCUTANEOUS
  Filled 2019-12-18 (×3): qty 1

## 2019-12-18 MED ORDER — METOPROLOL SUCCINATE ER 100 MG PO TB24
100.0000 mg | ORAL_TABLET | Freq: Every day | ORAL | Status: DC
  Administered 2019-12-18: 100 mg via ORAL
  Filled 2019-12-18: qty 4

## 2019-12-18 MED ORDER — ASPIRIN 81 MG PO CHEW
324.0000 mg | CHEWABLE_TABLET | ORAL | Status: AC
  Administered 2019-12-18: 324 mg via ORAL
  Filled 2019-12-18: qty 4

## 2019-12-18 MED ORDER — ASPIRIN EC 81 MG PO TBEC
81.0000 mg | DELAYED_RELEASE_TABLET | Freq: Every day | ORAL | Status: DC
  Administered 2019-12-19: 81 mg via ORAL
  Filled 2019-12-18: qty 1

## 2019-12-18 MED ORDER — NITROGLYCERIN 0.4 MG SL SUBL: 0.4000 mg | SUBLINGUAL_TABLET | SUBLINGUAL | Status: DC | PRN

## 2019-12-18 NOTE — ED Notes (Signed)
Pt care endorsed to Michele RN.  

## 2019-12-18 NOTE — ED Notes (Signed)
Pt alert and oriented, pain free at this time.  Denies having CP at any time, sx localized to L arm with shooting pain and tingling to hand.  Sx resolved currently.  No distress, talkative and restful.

## 2019-12-18 NOTE — ED Provider Notes (Signed)
[facility]   CSN: [id number] Arrival date & time: 12/18/19  [id number]   History Chief Complaint  Patient presents with  . Chest Pain   [name] is a 54 y.o. male with past medical history significant for Valsalva aneurysm, HTN who presents for evaluation of left arm pain. Patient states he awoke this morning to left shoulder and arm pain. States CP on Saturday which lasted 1 hours and self resolved. No associated radiate, N/V, diaphoresis. No current CP, SOB, back pain. On arrival to ED states he had pressure to LU chest into axilla which had resolved by my exam. Patient states pain located to left shoulder with radiation into arm. States had brief numbness to hand which he described as sharp and shooting. Numbness resolved PTA. NOoecent injury or trauma. Rated his shoulder pain a 10/10 however no pain at this time. No neck pain or neck stiffness. Mild head gradual onset. No sudden onset thunderclap HA. No blurred vision, vision cuts, facial droop, dysphagia. No fever, chills, dizziness, lightheadedness, CP, SOB, hemoptysis, unilateral weakness, abd pain, dysuria, constipation. Denies additional aggravating or alleviating factors.  ASA at home PTA.   History obtained from patient and past medical records. No interpretor was used.  PCP- [facility] Dr. Clovis RileyMitchell CT surg Dr. Laneta SimmersBartle Cards- Dr. Katrinka BlazingSmith  HPI     Past Medical History:  Diagnosis Date  . Aberrant subclavian artery    RIGHT...PER CT CHEST...07/20/13  . AKI (acute kidney injury) (HCC) 06/2018  . 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 Chest pain CT ANGIO CHEST 07/20/13   LED TO DISCOVERY OF SINUS OF VALSALVA  ANEURYSM  . Erectile dysfunction    DR. NESI  . History of kidney stones   . Hypertension   . Kidney stone     Patient Active Problem List   Diagnosis Date Noted  . HTN, goal below 130/80 12/18/2019  . Acute renal  failure superimposed on stage 3 chronic kidney disease (HCC) 06/21/2018  . AKI (acute kidney injury) (HCC) 06/21/2018  . Depression   . Erectile dysfunction   . Hypertension   . Kidney stone   . Chest pain   . 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 Aberrant subclavian artery     Past Surgical History:  Procedure Laterality Date  . IR FLUORO GUIDE CV LINE RIGHT  06/23/2018  . IR REMOVAL TUN CV CATH W/O FL  06/30/2018  . IR US GUIDE VASC ACCESS RIGHT  06/23/2018  . KNEE ARTHROPLASTY    . NO PAST SURGERIES    . ROTATOR CUFF REPAIR Right        Family History  Problem Relation Age of Onset  . Cancer Mother        BREAST CANCER  . Cancer Father        MUTIPLE MYELOMA  . Hypertension Father   . Cancer Brother        ? TYPE    Social History   Tobacco Use  . Smoking status: Never Smoker  . Smokeless tobacco: Never Used  Substance Use Topics  . Alcohol use: Yes    Comment: RARE  . Drug use: No    Home Medications Prior to Admission medications   Medication Sig Start Date End Date Taking? Authorizing Provider  amLODipine (NORVASC) 10 MG tablet Take 1 tablet (10 mg total) by mouth daily. 03/14/18  Yes Belva Crome, MD  lisinopril (ZESTRIL) 20 MG tablet Take 20 mg by mouth daily.   Yes [provider]  metoprolol (TOPROL-XL) 200 MG 24 hr tablet Take 200 mg by mouth daily. 10/26/19  Yes [provider]  tadalafil (CIALIS) 20 MG tablet Take 10-20 mg by mouth daily. 10/26/19  Yes [provider]    Allergies    Sulfasalazine, Sulfa antibiotics, and Viagra [sildenafil citrate]  Review of Systems   Review of Systems  Constitutional: Negative.   HENT: Negative.   Respiratory: Negative.   Cardiovascular: Positive for chest pain (Saturday, none currently). Negative for palpitations and leg swelling.  Gastrointestinal: Negative.   Genitourinary: Negative.   Musculoskeletal: Negative for back pain, gait problem, joint swelling, neck  pain and neck stiffness.       Left shoulder pain  Skin: Negative.   Neurological: Positive for numbness (Tingling to left arm, none currently) and headaches. Negative for dizziness, tremors, seizures, syncope, facial asymmetry, speech difficulty, weakness and light-headedness.  All other systems reviewed and are negative.   Physical Exam Updated Vital Signs BP (!) 170/126   Pulse 73   Temp 98.1 F (36.7 C)   Resp 16   Ht 5' 11.5" (1.816 m)   Wt 111.1 kg   SpO2 97%   BMI 33.69 kg/m   Physical Exam Vitals and nursing note reviewed.  Constitutional:      General: He is not in acute distress.    Appearance: He is not ill-appearing, toxic-appearing or diaphoretic.  HENT:     Head: Normocephalic and atraumatic.     Jaw: There is normal jaw occlusion.     Right Ear: Tympanic membrane, ear canal and external ear normal. There is no impacted cerumen. No hemotympanum. Tympanic membrane is not injected, scarred, perforated, erythematous, retracted or bulging.     Left Ear: Tympanic membrane, ear canal and external ear normal. There is no impacted cerumen. No hemotympanum. Tympanic membrane is not injected, scarred, perforated, erythematous, retracted or bulging.     Ears:     Comments: No Mastoid tenderness.    Nose:     Comments: Clear rhinorrhea and congestion to bilateral nares.  No sinus tenderness.    Mouth/Throat:     Comments: Posterior oropharynx clear.  Mucous membranes moist.  Tonsils without erythema or exudate.  Uvula midline without deviation.  No evidence of PTA or RPA.  No drooling, dysphasia or trismus.  Phonation normal. Eyes:     Comments: No horizontal, vertical or rotational nystagmus   Neck:     Trachea: Trachea and phonation normal.     Meningeal: Brudzinski's sign and Kernig's sign absent.     Comments: No Neck stiffness or neck rigidity.  No meningismus.  No cervical lymphadenopathy. Cardiovascular:     Rate and Rhythm: Normal rate.     Pulses: Normal pulses.           Radial pulses are 2+ on the right side and 2+ on the left side.       Dorsalis pedis pulses are 2+ on the right side and 2+ on the left side.     Heart sounds: Normal heart sounds.     Comments: No murmurs rubs or gallops. Pulmonary:     Comments: Clear to auscultation bilaterally without wheeze, rhonchi or rales.  No accessory muscle usage.  Able speak in full sentences. Abdominal:     Comments: Soft, nontender without rebound or guarding.  No CVA tenderness.  Musculoskeletal:  Comments: Moves all 4 extremities without difficulty.  Lower extremities without edema, erythema or warmth.  Skin:    Comments: Brisk capillary refill.  No rashes or lesions.  Neurological:     Mental Status: He is alert.     Comments: Mental Status:  Alert, oriented, thought content appropriate. Speech fluent without evidence of aphasia. Able to follow 2 step commands without difficulty.  Cranial Nerves:  II:  Peripheral visual fields grossly normal, pupils equal, round, reactive to light III,IV, VI: ptosis not present, extra-ocular motions intact bilaterally  V,VII: smile symmetric, facial light touch sensation equal VIII: hearing grossly normal bilaterally  IX,X: midline uvula rise  XI: bilateral shoulder shrug equal and strong XII: midline tongue extension  Motor:  5/5 in upper and lower extremities bilaterally including strong and equal grip strength and dorsiflexion/plantar flexion Sensory: Pinprick and light touch normal in all extremities.  Deep Tendon Reflexes: 2+ and symmetric  Cerebellar: normal finger-to-nose with bilateral upper extremities Gait: normal gait and balance CV: distal pulses palpable throughout       ED Results / Procedures / Treatments   Labs (all labs ordered are listed, but only abnormal results are displayed) Labs Reviewed  BASIC METABOLIC PANEL - Abnormal; Notable for the following components:      Result Value   Creatinine, Ser 1.59 (*)    GFR calc non Af  Amer 48 (*)    GFR calc Af Amer 56 (*)    All other components within normal limits  CBC - Abnormal; Notable for the following components:   WBC 3.0 (*)    RBC 5.97 (*)    Hemoglobin 17.8 (*)    HCT 53.0 (*)    All other components within normal limits  SARS CORONAVIRUS 2 (TAT 6-24 HRS)  PROTIME-INR  TROPONIN I (HIGH SENSITIVITY)  TROPONIN I (HIGH SENSITIVITY)  TROPONIN I (HIGH SENSITIVITY)    EKG EKG Interpretation  Date/Time:  Tuesday December 18 2019 06:18:26 EST Ventricular Rate:  78 PR Interval:  240 QRS Duration: 84 QT Interval:  400 QTC Calculation: 456 R Axis:   -35 Text Interpretation: Sinus rhythm with 1st degree A-V block Possible Left atrial enlargement Left axis deviation Left ventricular hypertrophy ( R in aVL , Cornell product ) Inferior infarct , age undetermined Anterior infarct , age undetermined Abnormal ECG When compared to prior, no significant changes seen. No STEMI Confirmed by Theda Belfast ([id number]) on 12/18/2019 8:42:48 AM   Radiology DG Chest 2 View  Result Date: 12/18/2019 CLINICAL DATA:  Chest pain EXAM: CHEST - 2 VIEW COMPARISON:  07/18/2013 FINDINGS: The cardiac silhouette, mediastinal and hilar contours are within normal limits and stable. There is moderate tortuosity of the thoracic aorta. The lungs are clear. No pleural effusions or pulmonary lesions. The bony thorax is intact. IMPRESSION: No acute cardiopulmonary findings. Electronically Signed   By: Rudie Meyer M.D.   On: 12/18/2019 06:41   CT Head Wo Contrast  Result Date: 12/18/2019 CLINICAL DATA:  Left arm and hand numbness EXAM: CT HEAD WITHOUT CONTRAST TECHNIQUE: Contiguous axial images were obtained from the base of the skull through the vertex without intravenous contrast. COMPARISON:  None. FINDINGS: Brain: There is no acute intracranial hemorrhage, mass-effect, or edema. Gray-white differentiation is preserved. There is no extra-axial fluid collection. Ventricles and sulci are within  normal limits in size and configuration. Vascular: Unremarkable. Skull: Calvarium is unremarkable. Sinuses/Orbits: Mild mucosal thickening.  Orbits are unremarkable. Other: None. IMPRESSION: No acute intracranial hemorrhage, mass effect, or  evidence of acute infarction. Electronically Signed   By: Guadlupe Spanish M.D.   On: 12/18/2019 08:32   DG Shoulder Left  Result Date: 12/18/2019 CLINICAL DATA:  Chest pain radiating to shoulder EXAM: LEFT SHOULDER - 2+ VIEW COMPARISON:  None. FINDINGS: Alignment is anatomic. No acute fracture. Joint spaces are preserved. No intrinsic osseous lesion. IMPRESSION: No acute osseous abnormality. Electronically Signed   By: Guadlupe Spanish M.D.   On: 12/18/2019 08:27   CT Angio Chest/Abd/Pel for Dissection W and/or W/WO  Result Date: 12/18/2019 CLINICAL DATA:  Nonspecific chest pain EXAM: CT ANGIOGRAPHY CHEST, ABDOMEN AND PELVIS TECHNIQUE: Multidetector CT imaging through the chest, abdomen and pelvis was performed using the standard protocol during bolus administration of intravenous contrast. Multiplanar reconstructed images and MIPs were obtained and reviewed to evaluate the vascular anatomy. CONTRAST:  OMNIPAQUE IOHEXOL 350 MG/ML SOLN repeat bolus was requested after consultation between technologists and a different radiologist. COMPARISON:  10/03/2019 FINDINGS: CTA CHEST FINDINGS Cardiovascular: Noncontrast phase shows no intramural aortic hematoma. There is also no notable calcification. No aortic dissection. There is a 4 vessel arch branching pattern with aberrant right subclavian artery. Aortic dimensions: *Valve 27 mm *Sinuses of Valsalva 46 mm *Sino-tubular junction 38 mm *Ascending segment 39 mm. The aorta is tortuous. On the first bolus there is good pulmonary artery opacification which is negative for filling defect. 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 There is history of dialysis  catheter. Normal heart size and no pericardial effusion Mediastinum/Nodes: Negative for adenopathy or hematoma. Reticulation of fat anterior to the great vessels is from thymus and stable when compared to prior. No superimposed nodularity. Lungs/Pleura: There is no edema, consolidation, effusion, or pneumothorax. Musculoskeletal: No acute or aggressive finding. Review of the MIP images confirms the above findings. CTA ABDOMEN AND PELVIS FINDINGS VASCULAR Aorta: Tortuous but normal diameter and non atheromatous. No dissection. Celiac: Tortuous vessels without aneurysm or stenosis. SMA: Unremarkable Renals: High origin of the right renal artery. Both renal arteries are smooth and widely patent. IMA: Patent Inflow: Tortuosity without stenosis or atherosclerosis. Veins: Negative Review of the MIP images confirms the above findings. NON-VASCULAR Hepatobiliary: 3 subcentimeter low densities in the liver, likely cystic although densitometry is limited by volume averaging.No evidence of biliary obstruction or stone. Pancreas: Unremarkable. Spleen: Unremarkable. Adrenals/Urinary Tract: Negative adrenals. No hydronephrosis or stone. Simple left renal cystic density. Unremarkable bladder. Stomach/Bowel:  No obstruction. No appendicitis. Vascular/Lymphatic: No acute vascular abnormality. No mass or adenopathy. Reproductive:Symmetrically enlarged prostate Other: No ascites or pneumoperitoneum. Musculoskeletal: No acute abnormalities.  Spondylosis. Review of the MIP images confirms the above findings. IMPRESSION: 1. No acute finding 2. 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 3. Left brachiocephalic vein stenosis Electronically Signed   By: Marnee Spring M.D.   On: 12/18/2019 09:16   Procedures .Critical Care Performed by: Linwood Dibbles, PA-C Authorized by: Linwood Dibbles, PA-C   Critical care provider statement:    Critical care time (minutes):  45   Critical care was necessary to treat or  prevent imminent or life-threatening deterioration of the following conditions:  Circulatory failure (Hypertensive emergency req multiple IV meds and admission)   Critical care was time spent personally by me on the following activities:  Discussions with consultants, evaluation of patient's response to treatment, examination of patient, ordering and performing treatments and interventions, ordering and review of laboratory studies, ordering and review of radiographic  studies, pulse oximetry, re-evaluation of patient's condition, obtaining history from patient or surrogate and review of old charts   (including critical care time)  Medications Ordered in ED Medications  lisinopril (ZESTRIL) tablet 20 mg (20 mg Oral Given 12/18/19 1259)  metoprolol succinate (TOPROL-XL) 24 hr tablet 100 mg (100 mg Oral Given 12/18/19 1258)  hydrochlorothiazide (HYDRODIURIL) tablet 25 mg (25 mg Oral Given 12/18/19 1259)  sodium chloride flush (NS) 0.9 % injection 3 mL (3 mLs Intravenous Given 12/18/19 1010)  iohexol (OMNIPAQUE) 350 MG/ML injection 150 mL (150 mLs Intravenous Contrast Given 12/18/19 0856)  labetalol (NORMODYNE) injection 10 mg (10 mg Intravenous Given 12/18/19 0940)  labetalol (NORMODYNE) injection 10 mg (10 mg Intravenous Given 12/18/19 1009)   ED Course  I have reviewed the triage vital signs and the nursing notes.  Pertinent labs & imaging results that were available during my care of the patient were reviewed by me and considered in my medical decision making (see chart for details).  54 year old presents for evaluation of left shoulder pain. Afebrile, non septic, non ill appearing. Chest pain on Saturday. Non currently. Has not taken his home BP meds. Known valsalva aneurysm. Left shoulder aching into LU chest wall. "Felt deep" Not similar to prior episodes of CP. Intermittent episode of left hand numbness with shooting pain. No neck pain or neck stiffness. No dizziness. Brief HA, denies sudden onset  thunderclap HA. No current weakness or paresthesias. NO CP radiating into back however is significantly hypertensive BP 211/138. Plain for labs, imaging. BP trending down without meds currently 185/131. 2+ radial pulses bilaterally.   Labs and imaging personally reviewed and interpreted: CBC with leukopenia, Hgb 17.8 BMP creatinine at 1.59, GFR 56 similar to previous labs Trop 6 DG chest- tortious thoracic aorta EKG similar to prior. CTA dissection with stable valsalva aneurysm at 28mm CT head negative  0900: Patient reassessed. No current pain. Pending CTA read.  5009: Discussed with patient results of CTA dissection, stable valsalva aneurysm. No current pain. Heart score 4. Will plan for admission for C/P R/O. COVID test ordered. Given 10 Labetalol IV for BP control. Current BP in room 176/130 HR 89.  1005: Patient with continued significant HTN with Labetalol IV. Will give additional dose and reassess.  1012: CONSULT with Dr. Katrinka Blazing with [facility], request Cardiology to admit for HTN urgency anc CP R/O. Will consult with cards.  CONSULT with Trish with Cardiology. They will evaluate patient. BP has trended down with BP meds however still elevated at 167/103/ Patient states BP at home normally 120-130 systolic. Patient will require admission for hypertensive emergency and chest pain rule out.  The patient appears reasonably stabilized for admission considering the current resources, flow, and capabilities available in the ED at this time, and I doubt any other [facility] requiring further screening and/or treatment in the ED prior to admission.  Patient discussed with attending, Dr. Rush Landmark who agrees with above treatment, plan and disposition.    MDM Rules/Calculators/A&P                       Final Clinical Impression(s) / ED Diagnoses Final diagnoses:  Precordial pain  Aneurysm of sinus of Valsalva  Hypertensive urgency    Rx / DC Orders ED Discharge Orders    None       Madeleyn Schwimmer,  Shamaya Kauer A, PA-C 12/18/19 1311    Tegeler, Canary Brim, MD 12/18/19 1616

## 2019-12-18 NOTE — ED Triage Notes (Signed)
Patient reports left chest pain this morning radiating to left shoulder and left arm with hand numbness , mild SOB , denies emesis or diaphoresis . No cough or fever .

## 2019-12-18 NOTE — H&P (Addendum)
Cardiology Admission History and Physical:   Patient ID: Bryan Riggs MRN: [id number]; DOB: [birth year]   Admission date: 12/18/2019  Primary Care Provider: Clovis Riley, L.August Saucer, MD Primary Cardiologist: Lesleigh Noe, MD  Primary Electrophysiologist:  None   Chief Complaint:  Chest pain  Patient Profile:   Bryan Riggs is a 54 y.o. male with ahx of essential hypertension, sinus of Valsalva aneurysm, recurring chest discomfort, and hyperlipidemia, now presenting with chest pain.  History of Present Illness:   Mr. Burbach with above hx presents to ER with HTN and chest pain.  He did not take his hypertensive meds this AM.  Has difficulty with HTN and was being followed by our pharmacy team for HTN.  Goal BP < 130 systolic.   Recently saw Dr. 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  His intermittent chest pain Dr. Laneta Simmers did not believe was due to chest pain.       BP in ER 185/131 to 167/103   Troponin 6 Na 140, K+ 3.6 BUN 19 Cr. 1.59  Hgb 17.8 WBC 3.0 Plts 211 CT head normal CT of chest :  IMPRESSION: 1. No acute finding 2. 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 3. Left brachiocephalic vein stenosis  EKG:  The ECG that was done today with SR and 1st degree AV block PR 240 ms LVH, Q wave III, AVF, V2- V 3 was personally reviewed.   Currently no chest pain at all.  Pt had episode of dizziness yesterday and then today he had chest pain Lt ant chest , lt shoulder with radiation down lt arm and arm felt numb.  Severe enough to bring to hospital, usually he does not need to come.  Did have brief rapid HR on Friday.  None since   meds amlodipine 10, zestril 20, toprol XL 100 mg daily HCTZ 25 and prn cialis.    Last OV BP control 02/2019 BP better at    10/03/19 at 139/90   Heart Pathway Score:     Past Medical History:  Diagnosis Date  . Aberrant subclavian artery    RIGHT...PER  CT CHEST...07/20/13  . AKI (acute kidney injury) (HCC) 06/2018  . 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 Chest pain CT ANGIO CHEST 07/20/13   LED TO DISCOVERY OF SINUS OF VALSALVA  ANEURYSM  . Erectile dysfunction    DR. NESI  . History of kidney stones   . Hypertension   . Kidney stone     Past Surgical History:  Procedure Laterality Date  . IR FLUORO GUIDE CV LINE RIGHT  06/23/2018  . IR REMOVAL TUN CV CATH W/O FL  06/30/2018  . IR US GUIDE VASC ACCESS RIGHT  06/23/2018  . KNEE ARTHROPLASTY    . NO PAST SURGERIES    . ROTATOR CUFF REPAIR Right      Medications Prior to Admission: Prior to Admission medications   Medication Sig Start Date End Date Taking? Authorizing Provider  amLODipine (NORVASC) 10 MG tablet Take 1 tablet (10 mg total) by mouth daily. 03/14/18  Yes Lyn Records, MD  lisinopril (ZESTRIL) 20 MG tablet Take 20 mg by mouth daily.   Yes [provider]  metoprolol (TOPROL-XL) 200 MG 24 hr tablet Take 200 mg by mouth daily. 10/26/19  Yes [provider]  tadalafil (CIALIS) 20 MG tablet Take 10-20 mg by mouth daily. 10/26/19  Yes [provider]     Allergies:    Allergies  Allergen Reactions  . Sulfasalazine Nausea And Vomiting  . Sulfa Antibiotics Rash and Nausea And Vomiting    Break out, itching  . Viagra [Sildenafil Citrate] Other (See Comments)    FLUSHING     Social History:   Social History   Socioeconomic History  . Marital status: Divorced    Spouse name: Not on file  . Number of children: Not on file  . Years of education: Not on file  . Highest education level: Not on file  Occupational History  . Not on file  Tobacco Use  . Smoking status: Never Smoker  . Smokeless tobacco: Never Used  Substance and Sexual Activity  . Alcohol use: Yes    Comment: RARE  . Drug use: No  . Sexual activity: Not on file  Other Topics Concern  . Not on file  Social History  Narrative  . Not on file   Social Determinants of Health   Financial Resource Strain:   . Difficulty of Paying Living Expenses: Not on file  Food Insecurity:   . Worried About Programme researcher, broadcasting/film/videounning Out of Food in the Last Year: Not on file  . Ran Out of Food in the Last Year: Not on file  Transportation Needs:   . Lack of Transportation (Medical): Not on file  . Lack of Transportation (Non-Medical): Not on file  Physical Activity:   . Days of Exercise per Week: Not on file  . Minutes of Exercise per Session: Not on file  Stress:   . Feeling of Stress : Not on file  Social Connections:   . Frequency of Communication with Friends and Family: Not on file  . Frequency of Social Gatherings with Friends and Family: Not on file  . Attends Religious Services: Not on file  . Active Member of Clubs or Organizations: Not on file  . Attends BankerClub or Organization Meetings: Not on file  . Marital Status: Not on file  Intimate Partner Violence:   . Fear of Current or Ex-Partner: Not on file  . Emotionally Abused: Not on file  . Physically Abused: Not on file  . Sexually Abused: Not on file    Family History:   The patient's family history includes Cancer in his brother, father, and mother; Hypertension in his father.    ROS:  Please see the history of present illness.  General:no colds or fevers, no weight changes Skin:no rashes or ulcers HEENT:no blurred vision, no congestion CV:see HPI PUL:see HPI GI:no diarrhea constipation or melena, no indigestion GU:no hematuria, no dysuria, + ED MS:no joint pain, no claudication Neuro:no syncope, no lightheadedness Endo:no diabetes, no thyroid disease  All other ROS reviewed and negative.     Physical Exam/Data:   Vitals:   12/18/19 1015 12/18/19 1030 12/18/19 1035 12/18/19 1045  BP: (!) 169/125 (!) 171/118 (!) 167/119 (!) 167/103  Pulse: 71 71 69 73  Resp: 13 (!) 21 17 18   Temp:      TempSrc:      SpO2: 96% 96% 96% 97%  Weight:      Height:         No intake or output data in the 24 hours ending 12/18/19 1151 Last 3 Weights 12/18/2019 10/03/2019 11/22/2018  Weight (lbs) 245 lb 248 lb 242 lb  Weight (kg) 111.131 kg 112.492 kg 109.77 kg  Body mass index is 33.69 kg/m.  General:  Well nourished, well developed, in no acute distress HEENT: normal Lymph: no adenopathy Neck: no JVD Endocrine:  No thryomegaly Vascular: No carotid bruits; FA pulses 2+ bilaterally without bruits  Cardiac:  normal S1, S2; RRR; no murmur gallup rub or click Lungs:  clear to auscultation bilaterally, no wheezing, rhonchi or rales  Abd: soft, nontender, no hepatomegaly  Ext: no lower ext edema Musculoskeletal:  No deformities, BUE and BLE strength normal and equal Lt shoulder with no pain to palpation  Skin: warm and dry  Neuro:  Alert and oriented X 3 MAE follows commands, no focal abnormalities noted Psych:  Normal affect     Relevant CV Studies: Echo 07/21/17 Study Conclusions   - Left ventricle: The cavity size was normal. There was mild  concentric hypertrophy. Systolic function was normal. The  estimated ejection fraction was in the range of 55% to 60%. Wall  motion was normal; there were no regional wall motion  abnormalities. Doppler parameters are consistent with abnormal  left ventricular relaxation (grade 1 diastolic dysfunction).  - Aortic valve: There was trivial regurgitation.  - Left atrium: The atrium was mildly dilated.  - Right ventricle: The cavity size was mildly dilated. Wall  thickness was normal.   Impressions:   - PA pressure could not be estimated, but 2D findings suggest  possible pulmonary HTN.   Laboratory Data:  High Sensitivity Troponin:   Recent Labs  Lab 12/18/19 0630  TROPONINIHS 6      Chemistry Recent Labs  Lab 12/18/19 0630  NA 140  K 3.6  CL 106  CO2 25  GLUCOSE 91  BUN 19  CREATININE 1.59*  CALCIUM 9.2  GFRNONAA 48*  GFRAA 56*  ANIONGAP 9    No results for input(s):  PROT, ALBUMIN, AST, ALT, ALKPHOS, BILITOT in the last 168 hours. Hematology Recent Labs  Lab 12/18/19 0630  WBC 3.0*  RBC 5.97*  HGB 17.8*  HCT 53.0*  MCV 88.8  MCH 29.8  MCHC 33.6  RDW 12.9  PLT 211   BNPNo results for input(s): BNP, PROBNP in the last 168 hours.  DDimer No results for input(s): DDIMER in the last 168 hours.   Radiology/Studies:  DG Chest 2 View  Result Date: 12/18/2019 CLINICAL DATA:  Chest pain EXAM: CHEST - 2 VIEW COMPARISON:  07/18/2013 FINDINGS: The cardiac silhouette, mediastinal and hilar contours are within normal limits and stable. There is moderate tortuosity of the thoracic aorta. The lungs are clear. No pleural effusions or pulmonary lesions. The bony thorax is intact. IMPRESSION: No acute cardiopulmonary findings. Electronically Signed   By: Marijo Sanes M.D.   On: 12/18/2019 06:41   CT Head Wo Contrast  Result Date: 12/18/2019 CLINICAL DATA:  Left arm and hand numbness EXAM: CT HEAD WITHOUT CONTRAST TECHNIQUE: Contiguous axial images were obtained from the base of the skull through the vertex without intravenous contrast. COMPARISON:  None. FINDINGS: Brain: There is no acute intracranial hemorrhage, mass-effect, or edema. Gray-white differentiation is preserved. There is no extra-axial fluid collection. Ventricles and sulci are within normal limits in size and configuration. Vascular: Unremarkable. Skull: Calvarium is unremarkable. Sinuses/Orbits: Mild mucosal thickening.  Orbits are unremarkable. Other: None. IMPRESSION: No acute intracranial hemorrhage, mass effect, or evidence of acute infarction. Electronically Signed   By: Macy Mis M.D.   On: 12/18/2019 08:32   DG Shoulder Left  Result Date: 12/18/2019 CLINICAL DATA:  Chest pain radiating to shoulder EXAM: LEFT  SHOULDER - 2+ VIEW COMPARISON:  None. FINDINGS: Alignment is anatomic. No acute fracture. Joint spaces are preserved. No intrinsic osseous lesion. IMPRESSION: No acute osseous abnormality.  Electronically Signed   By: Guadlupe Spanish M.D.   On: 12/18/2019 08:27   CT Angio Chest/Abd/Pel for Dissection W and/or W/WO  Result Date: 12/18/2019 CLINICAL DATA:  Nonspecific chest pain EXAM: CT ANGIOGRAPHY CHEST, ABDOMEN AND PELVIS TECHNIQUE: Multidetector CT imaging through the chest, abdomen and pelvis was performed using the standard protocol during bolus administration of intravenous contrast. Multiplanar reconstructed images and MIPs were obtained and reviewed to evaluate the vascular anatomy. CONTRAST:  OMNIPAQUE IOHEXOL 350 MG/ML SOLN repeat bolus was requested after consultation between technologists and a different radiologist. COMPARISON:  10/03/2019 FINDINGS: CTA CHEST FINDINGS Cardiovascular: Noncontrast phase shows no intramural aortic hematoma. There is also no notable calcification. No aortic dissection. There is a 4 vessel arch branching pattern with aberrant right subclavian artery. Aortic dimensions: *Valve 27 mm *Sinuses of Valsalva 46 mm *Sino-tubular junction 38 mm *Ascending segment 39 mm. The aorta is tortuous. On the first bolus there is good pulmonary artery opacification which is negative for filling defect. 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 There is history of dialysis catheter. Normal heart size and no pericardial effusion Mediastinum/Nodes: Negative for adenopathy or hematoma. Reticulation of fat anterior to the great vessels is from thymus and stable when compared to prior. No superimposed nodularity. Lungs/Pleura: There is no edema, consolidation, effusion, or pneumothorax. Musculoskeletal: No acute or aggressive finding. Review of the MIP images confirms the above findings. CTA ABDOMEN AND PELVIS FINDINGS VASCULAR Aorta: Tortuous but normal diameter and non atheromatous. No dissection. Celiac: Tortuous vessels without aneurysm or stenosis. SMA: Unremarkable Renals: High origin of the right  renal artery. Both renal arteries are smooth and widely patent. IMA: Patent Inflow: Tortuosity without stenosis or atherosclerosis. Veins: Negative Review of the MIP images confirms the above findings. NON-VASCULAR Hepatobiliary: 3 subcentimeter low densities in the liver, likely cystic although densitometry is limited by volume averaging.No evidence of biliary obstruction or stone. Pancreas: Unremarkable. Spleen: Unremarkable. Adrenals/Urinary Tract: Negative adrenals. No hydronephrosis or stone. Simple left renal cystic density. Unremarkable bladder. Stomach/Bowel:  No obstruction. No appendicitis. Vascular/Lymphatic: No acute vascular abnormality. No mass or adenopathy. Reproductive:Symmetrically enlarged prostate Other: No ascites or pneumoperitoneum. Musculoskeletal: No acute abnormalities.  Spondylosis. Review of the MIP images confirms the above findings. IMPRESSION: 1. No acute finding 2. 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 3. Left brachiocephalic vein stenosis Electronically Signed   By: Marnee Spring M.D.   On: 12/18/2019 09:16       HEAR Score (for undifferentiated chest pain):    2   Assessment and Plan:   1. HTN uncontrolled, did not take meds this AM here in ER has had amlodipine 10, and 10 mg IV labetalol.  Will give home meds toprol XL 100 mg daily, Zestril and HCTZ..  Needs BP control to prevent aortic dissection with known aortic aneurysm.  Pt forgets to take meds at times -he waits until he eats then forgets   2. Chest pain with no acute EKG changes and neg troponin on first check.  Second pending.  No further chest pain.  May have had remote stress test and no calcification on coronary arteries on chest CTA.   Possible admit to control BP - most likely cause of pain and stress test in AM  Dr. Herbie Baltimore to see.  3.  Hx of aberrant subclavian artery  4. COVID pending   5. CKD-3a   Cr 1.68 is average does have hx of AKI in 2019.  6. Aortic root aneurysm stable  in Nov and today followed by Dr. Laneta Simmers.  4.6 cm.    Severity of Illness: The appropriate patient status for this patient is OBSERVATION. Observation status is judged to be reasonable and necessary in order to provide the required intensity of service to ensure the patient's safety. The patient's presenting symptoms, physical exam findings, and initial radiographic and laboratory data in the context of their medical condition is felt to place them at decreased risk for further clinical deterioration. Furthermore, it is anticipated that the patient will be medically stable for discharge from the hospital within 2 midnights of admission. The following factors support the patient status of observation.   " The patient's presenting symptoms include uncontrolled HTN , and chest pain.  " The physical exam findings include none. " The initial radiographic and laboratory data are negative heart attack,  But BP still elevated.      Signed, Nada Boozer, NP  12/18/2019 11:51 AM   ATTENDING ATTESTATION  I have seen, examined and evaluated the patient this morning along with Nada Boozer, NP.  After reviewing all the available data and chart, we discussed the patients laboratory, study & physical findings as well as symptoms in detail. I agree with her findings, examination as well as impression recommendations as per our discussion.    JAIRE PINKHAM presents the emergency room with accelerated hypertension and chest pain.  Initial troponin levels were negative, will continue to check delta.  Symptoms are concerning for potential coronary ischemia and that it was heavy chest discomfort radiating to the left arm, but it occurred at rest which is somewhat atypical that it had not occurred previously with exertion.  Prolonged episode this morning and negative troponins. He does have a history of hypertension and aortic root dilation that was found to be stable on CT angiogram of the chest.  At this point, I  agree with observation admission to the hospital to treat blood pressure with his home medications and as needed.  If he does rule in with troponins, would start IV heparin and consider cardiac cath, however if enzymes continue to be negative, would plan for Myoview in the morning..  Would opt for Myoview as opposed to coronary CTA with CKD 3A and recent contrast load with chest CTA.  This would avoid a potential third contrast load if cardiac cath is required.  We will reassess in the morning.   Bryan Lemma, M.D., M.S. Interventional Cardiologist   Pager # [phone] Phone # [phone] [address] [place], [place] [address]     For questions or updates, please contact [facility] Please consult [email or web address] for contact info under

## 2019-12-18 NOTE — Progress Notes (Signed)
   12/18/19 2125  Vitals  BP (!) 158/114 (MD notified)  BP Location Left Arm  BP Method Manual  Patient Position (if appropriate) Lying  MEWS Score  MEWS Temp 0  MEWS Systolic 0  MEWS Pulse 0  MEWS RR 0  MEWS LOC 0  MEWS Score 0  MEWS Score Color Green  MD notified. Parameters updated. No PRNs at this time.

## 2019-12-18 NOTE — ED Notes (Signed)
Pt came to the ED per triage complaint. Pt conscious, breathing, and A&Ox4. Pt brought back to [address] via wheelchair. Pt endorses "I had chest pressure and pain shoot down my left arm this morning". Chest rise and fall equally with non-labored breathing. Lungs clear apex to base. Abd soft and non-tender. Pt denies n/v/d  and f/c. Pt endorses chest pain and shortness of breath. Pt endorses pain 8 out of 10 pain that's pressure like. PIVC placed on the LAC with a 20G which had positive blood return and flushed without pain or infiltration. Blood collected, labeled, and sent to lab. Bed in lowest position with call light within reach. Pt on continuous blood pressure, pulse ox, and cardiac monitor. Will continue to monitor. Awaiting MD eval. No distress noted. Pt endorses not taking his AM BP medications.

## 2019-12-19 ENCOUNTER — Observation Stay (HOSPITAL_COMMUNITY): Payer: BC Managed Care – PPO

## 2019-12-19 ENCOUNTER — Ambulatory Visit (HOSPITAL_BASED_OUTPATIENT_CLINIC_OR_DEPARTMENT_OTHER): Payer: BC Managed Care – PPO

## 2019-12-19 DIAGNOSIS — R079 Chest pain, unspecified: Secondary | ICD-10-CM | POA: Diagnosis not present

## 2019-12-19 LAB — BASIC METABOLIC PANEL
Anion gap: 10 (ref 5–15)
BUN: 17 mg/dL (ref 6–20)
CO2: 25 mmol/L (ref 22–32)
Calcium: 9.1 mg/dL (ref 8.9–10.3)
Chloride: 105 mmol/L (ref 98–111)
Creatinine, Ser: 1.52 mg/dL — ABNORMAL HIGH (ref 0.61–1.24)
GFR calc Af Amer: 59 mL/min — ABNORMAL LOW (ref >60)
GFR calc non Af Amer: 51 mL/min — ABNORMAL LOW (ref >60)
Glucose, Bld: 92 mg/dL (ref 70–99)
Potassium: 3.6 mmol/L (ref 3.5–5.1)
Sodium: 140 mmol/L (ref 135–145)

## 2019-12-19 LAB — CBC
HCT: 52.2 % — ABNORMAL HIGH (ref 39.0–52.0)
Hemoglobin: 17.7 g/dL — ABNORMAL HIGH (ref 13.0–17.0)
MCH: 29.8 pg (ref 26.0–34.0)
MCHC: 33.9 g/dL (ref 30.0–36.0)
MCV: 88 fL (ref 80.0–100.0)
Platelets: 185 10*3/uL (ref 150–400)
RBC: 5.93 MIL/uL — ABNORMAL HIGH (ref 4.22–5.81)
RDW: 12.8 % (ref 11.5–15.5)
WBC: 2.8 10*3/uL — ABNORMAL LOW (ref 4.0–10.5)
nRBC: 0 % (ref 0.0–0.2)

## 2019-12-19 LAB — NM MYOCAR MULTI W/SPECT W/WALL MOTION / EF
LV dias vol: 179 mL (ref 62–150)
LV sys vol: 89 mL
MPHR: 166 {beats}/min
Peak HR: 80 {beats}/min
Percent HR: 48 %
Rest HR: 59 {beats}/min
TID: 1.4

## 2019-12-19 LAB — LIPID PANEL
Cholesterol: 155 mg/dL (ref 0–200)
HDL: 34 mg/dL — ABNORMAL LOW (ref >40)
LDL Cholesterol: 103 mg/dL — ABNORMAL HIGH (ref 0–99)
Total CHOL/HDL Ratio: 4.6 RATIO
Triglycerides: 89 mg/dL (ref <150)
VLDL: 18 mg/dL (ref 0–40)

## 2019-12-19 LAB — ECHOCARDIOGRAM COMPLETE
Height: 71.5 in
Weight: 3920 oz

## 2019-12-19 MED ORDER — TECHNETIUM TC 99M TETROFOSMIN IV KIT
30.0000 | PACK | Freq: Once | INTRAVENOUS | Status: AC | PRN
  Administered 2019-12-19: 30 via INTRAVENOUS

## 2019-12-19 MED ORDER — TECHNETIUM TC 99M TETROFOSMIN IV KIT
10.0000 | PACK | Freq: Once | INTRAVENOUS | Status: AC | PRN
  Administered 2019-12-19: 10:00:00 10 via INTRAVENOUS

## 2019-12-19 MED ORDER — CARVEDILOL 25 MG PO TABS: 25.0000 mg | ORAL_TABLET | Freq: Two times a day (BID) | ORAL | 1 refills | Status: DC

## 2019-12-19 MED ORDER — CHLORTHALIDONE 25 MG PO TABS: 25.0000 mg | ORAL_TABLET | Freq: Every day | ORAL | 2 refills | Status: DC

## 2019-12-19 MED ORDER — ATORVASTATIN CALCIUM 40 MG PO TABS: 40.0000 mg | ORAL_TABLET | Freq: Every day | ORAL | 1 refills | Status: DC

## 2019-12-19 MED ORDER — CHLORTHALIDONE 25 MG PO TABS
25.0000 mg | ORAL_TABLET | Freq: Every day | ORAL | Status: DC
  Administered 2019-12-19: 25 mg via ORAL
  Filled 2019-12-19: qty 1

## 2019-12-19 MED ORDER — REGADENOSON 0.4 MG/5ML IV SOLN
INTRAVENOUS | Status: AC
  Filled 2019-12-19: qty 5

## 2019-12-19 MED ORDER — CARVEDILOL 25 MG PO TABS
25.0000 mg | ORAL_TABLET | Freq: Two times a day (BID) | ORAL | Status: DC
  Administered 2019-12-19: 25 mg via ORAL
  Filled 2019-12-19: qty 1

## 2019-12-19 MED ORDER — HYDRALAZINE HCL 25 MG PO TABS: 25.0000 mg | ORAL_TABLET | Freq: Two times a day (BID) | ORAL | 1 refills | Status: DC

## 2019-12-19 MED ORDER — ISOSORBIDE DINITRATE 20 MG PO TABS: 20.0000 mg | ORAL_TABLET | Freq: Two times a day (BID) | ORAL | 1 refills | Status: DC

## 2019-12-19 MED ORDER — REGADENOSON 0.4 MG/5ML IV SOLN
0.4000 mg | Freq: Once | INTRAVENOUS | Status: AC
  Administered 2019-12-19: 0.4 mg via INTRAVENOUS
  Filled 2019-12-19: qty 5

## 2019-12-19 NOTE — Progress Notes (Signed)
   12/19/19 0352  Vitals  Temp 97.6 F (36.4 C)  Temp Source Oral  BP (!) 158/110  MAP (mmHg) 126  BP Location Left Arm  BP Method Automatic  Patient Position (if appropriate) Sitting  Pulse Rate 66  Pulse Rate Source Dinamap  Resp 18  Oxygen Therapy  SpO2 100 %  O2 Device Room Air  Pain Assessment  Pain Scale 0-10  Pain Score 0  MEWS Score  MEWS Temp 0  MEWS Systolic 0  MEWS Pulse 0  MEWS RR 0  MEWS LOC 0  MEWS Score 0  MEWS Score Color Green  Tele called at [phone] to report ST elevation up to 0.2. VS and EKG obtained to confirm changes. Pt alert and oriented, no chest pain reported. Dr. Okey Dupre called and updated of pt status and changes to EKG.

## 2019-12-19 NOTE — Progress Notes (Signed)
  Echocardiogram 2D Echocardiogram has been performed.  Bryan Riggs 12/19/2019, 2:34 PM

## 2019-12-19 NOTE — Progress Notes (Signed)
   [name] presented for a Lexiscan nuclear stress test today.  No immediate complications.  Stress imaging is pending at this time.  Preliminary EKG findings may be listed in the chart, but the stress test result will not be finalized until perfusion imaging is complete.  Joee Iovine David Stall, PA-C  12/19/2019, 11:19 AM

## 2019-12-19 NOTE — Progress Notes (Signed)
Pt's ride here, pt d/c'd with his instructions and belongings. Pt elected to walk out with his family member, declined to go in a wheelchair.

## 2019-12-19 NOTE — Progress Notes (Addendum)
Progress Note  Patient Name: Bryan Riggs Date of Encounter: 12/19/2019  Primary Cardiologist: Lesleigh Noe, MD   Subjective   Patient seen in nuc med.   Inpatient Medications    Scheduled Meds: . amLODipine  10 mg Oral Daily  . aspirin EC  81 mg Oral Daily  . atorvastatin  40 mg Oral q1800  . carvedilol  25 mg Oral BID WC  . chlorthalidone  25 mg Oral Daily  . heparin  5,000 Units Subcutaneous Q8H  . lisinopril  20 mg Oral Daily  . regadenoson      . sodium chloride flush  3 mL Intravenous Q12H   Continuous Infusions: . sodium chloride     PRN Meds: sodium chloride, acetaminophen, ALPRAZolam, nitroGLYCERIN, ondansetron (ZOFRAN) IV, sodium chloride flush   Vital Signs    Vitals:   12/19/19 1048 12/19/19 1106 12/19/19 1107 12/19/19 1109  BP: (!) 184/124 (!) 181/111 (!) 180/113 (!) 170/114  Pulse:      Resp:      Temp:      TempSrc:      SpO2:      Weight:      Height:        Intake/Output Summary (Last 24 hours) at 12/19/2019 1124 Last data filed at 12/18/2019 2133 Gross per 24 hour  Intake 110 ml  Output --  Net 110 ml   Last 3 Weights 12/18/2019 10/03/2019 11/22/2018  Weight (lbs) 245 lb 248 lb 242 lb  Weight (kg) 111.131 kg 112.492 kg 109.77 kg      Telemetry    Not reviewed - Personally Reviewed  ECG    NSR, first degree AV block, PRI 272 ms, nonspecific T wave changes - Personally Reviewed  Physical Exam   GEN: No acute distress.   Neck: No JVD Cardiac: RRR, no murmurs, rubs, or gallops.  Respiratory: Clear to auscultation bilaterally. GI: Soft, nontender, non-distended  MS: No edema; No deformity. Neuro:  Nonfocal  Psych: Normal affect   Labs    High Sensitivity Troponin:   Recent Labs  Lab 12/18/19 0630 12/18/19 1303  TROPONINIHS 6 6      Chemistry Recent Labs  Lab 12/18/19 0630 12/18/19 1656 12/19/19 0701  NA 140  --  140  K 3.6  --  3.6  CL 106  --  105  CO2 25  --  25  GLUCOSE 91  --  92  BUN 19  --  17   CREATININE 1.59* 1.66* 1.52*  CALCIUM 9.2  --  9.1  GFRNONAA 48* 46* 51*  GFRAA 56* 53* 59*  ANIONGAP 9  --  10     Hematology Recent Labs  Lab 12/18/19 0630 12/18/19 1656 12/19/19 0701  WBC 3.0* 2.8* 2.8*  RBC 5.97* 5.86* 5.93*  HGB 17.8* 17.4* 17.7*  HCT 53.0* 50.9 52.2*  MCV 88.8 86.9 88.0  MCH 29.8 29.7 29.8  MCHC 33.6 34.2 33.9  RDW 12.9 12.8 12.8  PLT 211 209 185    BNPNo results for input(s): BNP, PROBNP in the last 168 hours.   DDimer No results for input(s): DDIMER in the last 168 hours.   Radiology    DG Chest 2 View  Result Date: 12/18/2019 CLINICAL DATA:  Chest pain EXAM: CHEST - 2 VIEW COMPARISON:  07/18/2013 FINDINGS: The cardiac silhouette, mediastinal and hilar contours are within normal limits and stable. There is moderate tortuosity of the thoracic aorta. The lungs are clear. No pleural effusions or pulmonary  lesions. The bony thorax is intact. IMPRESSION: No acute cardiopulmonary findings. Electronically Signed   By: Rudie Meyer M.D.   On: 12/18/2019 06:41   CT Head Wo Contrast  Result Date: 12/18/2019 CLINICAL DATA:  Left arm and hand numbness EXAM: CT HEAD WITHOUT CONTRAST TECHNIQUE: Contiguous axial images were obtained from the base of the skull through the vertex without intravenous contrast. COMPARISON:  None. FINDINGS: Brain: There is no acute intracranial hemorrhage, mass-effect, or edema. Gray-white differentiation is preserved. There is no extra-axial fluid collection. Ventricles and sulci are within normal limits in size and configuration. Vascular: Unremarkable. Skull: Calvarium is unremarkable. Sinuses/Orbits: Mild mucosal thickening.  Orbits are unremarkable. Other: None. IMPRESSION: No acute intracranial hemorrhage, mass effect, or evidence of acute infarction. Electronically Signed   By: Guadlupe Spanish M.D.   On: 12/18/2019 08:32   DG Shoulder Left  Result Date: 12/18/2019 CLINICAL DATA:  Chest pain radiating to shoulder EXAM: LEFT SHOULDER  - 2+ VIEW COMPARISON:  None. FINDINGS: Alignment is anatomic. No acute fracture. Joint spaces are preserved. No intrinsic osseous lesion. IMPRESSION: No acute osseous abnormality. Electronically Signed   By: Guadlupe Spanish M.D.   On: 12/18/2019 08:27   CT Angio Chest/Abd/Pel for Dissection W and/or W/WO  Result Date: 12/18/2019 CLINICAL DATA:  Nonspecific chest pain EXAM: CT ANGIOGRAPHY CHEST, ABDOMEN AND PELVIS TECHNIQUE: Multidetector CT imaging through the chest, abdomen and pelvis was performed using the standard protocol during bolus administration of intravenous contrast. Multiplanar reconstructed images and MIPs were obtained and reviewed to evaluate the vascular anatomy. CONTRAST:  OMNIPAQUE IOHEXOL 350 MG/ML SOLN repeat bolus was requested after consultation between technologists and a different radiologist. COMPARISON:  10/03/2019 FINDINGS: CTA CHEST FINDINGS Cardiovascular: Noncontrast phase shows no intramural aortic hematoma. There is also no notable calcification. No aortic dissection. There is a 4 vessel arch branching pattern with aberrant right subclavian artery. Aortic dimensions: *Valve 27 mm *Sinuses of Valsalva 46 mm *Sino-tubular junction 38 mm *Ascending segment 39 mm. The aorta is tortuous. On the first bolus there is good pulmonary artery opacification which is negative for filling defect. 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 There is history of dialysis catheter. Normal heart size and no pericardial effusion Mediastinum/Nodes: Negative for adenopathy or hematoma. Reticulation of fat anterior to the great vessels is from thymus and stable when compared to prior. No superimposed nodularity. Lungs/Pleura: There is no edema, consolidation, effusion, or pneumothorax. Musculoskeletal: No acute or aggressive finding. Review of the MIP images confirms the above findings. CTA ABDOMEN AND PELVIS FINDINGS VASCULAR  Aorta: Tortuous but normal diameter and non atheromatous. No dissection. Celiac: Tortuous vessels without aneurysm or stenosis. SMA: Unremarkable Renals: High origin of the right renal artery. Both renal arteries are smooth and widely patent. IMA: Patent Inflow: Tortuosity without stenosis or atherosclerosis. Veins: Negative Review of the MIP images confirms the above findings. NON-VASCULAR Hepatobiliary: 3 subcentimeter low densities in the liver, likely cystic although densitometry is limited by volume averaging.No evidence of biliary obstruction or stone. Pancreas: Unremarkable. Spleen: Unremarkable. Adrenals/Urinary Tract: Negative adrenals. No hydronephrosis or stone. Simple left renal cystic density. Unremarkable bladder. Stomach/Bowel:  No obstruction. No appendicitis. Vascular/Lymphatic: No acute vascular abnormality. No mass or adenopathy. Reproductive:Symmetrically enlarged prostate Other: No ascites or pneumoperitoneum. Musculoskeletal: No acute abnormalities.  Spondylosis. Review of the MIP images confirms the above findings. IMPRESSION: 1. No acute finding 2. 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   On: 12/18/2019 09:16    Cardiac Studies   Lexiscan pending  Echo results pending   Patient Profile     54 y.o. male with pmh of HTN, sinus of valsalva aneurysm, recurrent chest pain who is being evaluated for chest pain.  Assessment & Plan    HTN, uncontrolled - Patient intermittently forgets to take his meds. At home patient is on toprol 200 mg daily, Zestril 20 mg daily, amlodipine 10 mg, and HCTZ - He needs BP control to prevent Aortic dissection with known aortic aneurysm -  In the hospital patient was started on amlodipine 10 mg daily and lisinopril 20 mg. BB was changed to Coreg 25 mg BID - Pressures still elevated today. Check renal US. Patient likely needs titration of  medications  Chest pain - EKG non acute - Troponin negative - Patient has not had chest pain since arriving to the hospital - Possible CP is from elevated pressures - echo results pending - Lexiscan performed today>>results pending  CKD stage 3a - creatinine 1.52 which is is baseline  For questions or updates, please contact [facility] Please consult [email or web address] for contact info under        Signed, Epifanio Labrador Ninfa Meeker, PA-C  12/19/2019, 11:24 AM

## 2019-12-19 NOTE — Progress Notes (Signed)
Tolerated stress test well 

## 2019-12-19 NOTE — Progress Notes (Signed)
Discharge instructions reviewed with pt.  Copy of instructions given to pt, pt informed his scripts for new meds sent to his pharmacy electronically by MD.

## 2019-12-19 NOTE — Discharge Summary (Signed)
Discharge Summary    Patient ID: Bryan Riggs,  MRN: [id number], DOB/AGE: Mar 31, 1966 54 y.o.  Admit date: 12/18/2019 Discharge date: 12/19/2019  Primary Care Provider: Alroy Dust, L.Dean Primary Cardiologist: Sinclair Grooms, MD  Discharge Diagnoses    Active Problems:   Chest pain   HTN, goal below 130/80   Allergies Allergies  Allergen Reactions  . Sulfasalazine Nausea And Vomiting  . Sulfa Antibiotics Rash and Nausea And Vomiting    Break out, itching  . Viagra [Sildenafil Citrate] Other (See Comments)    FLUSHING    Diagnostic Studies/Procedures    TTE: 12/19/19  Read pending at the time of discharge _____________   History of Present Illness     Bryan Riggs with PMH of HTN, sinus of Valsalva aneurysm, HL who presented to ER with HTN and chest pain.  He did not take his hypertensive meds the morning of admission.  Had difficulty with HTN and was being followed by our pharmacy team for HTN. Goal BP < 505 systolic. Recently saw Dr. 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 BP in ER 185/131 to 167/103.   Labs in the ED showed:  Troponin 6 Na 140, K+ 3.6 BUN 19 Cr. 1.59  Hgb 17.8 WBC 3.0 Plts 211 CT head normal CT of chest :  IMPRESSION: 1. No acute finding 2. 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 3. Left brachiocephalic vein stenosis  EKG:  The ECG that was done in the ER showed SR and 1st degree AV block PR 240 ms LVH, Q wave III, AVF, V2- V 3 was personally reviewed.   No chest pain at the time of assessment in the ED.  Pt had episode of dizziness the day prior to presenting to the ED.  Home meds include amlodipine 10, zestril 20, toprol XL 100 mg daily HCTZ 25 and prn cialis.    Given his symptoms he was admitted with plans for echo and stress testing.   Hospital Course     Follow up labs showed hsTn of 6>>6. No further episodes of chest pain. Underwent  lexiscan myoview that showed a mildly reduced EF of 45-54% and low risk study. Also had an echo was reviewed by Dr. Ellyn Hack prior to discharge which looked stable, but official read was pending. Blood pressures remained elevated therefore his home medications were continued, but added hydralazine 25mg  BID and isordil 20mg  BID. His home HCTZ was transitioned to chlorthalidone 25mg , and Toprol 200mg  daily switched to Coreg 25mg  BID. He is asked to monitor his blood pressures at home and bring to follow up appt. LDL noted at 103 and placed on high dose statin.   Bryan Riggs was seen by Dr. Ellyn Hack and determined stable for discharge home. Follow up in the office has been arranged. Medications are listed below.  _____________  Discharge Vitals Blood pressure (!) 159/116, pulse 69, temperature 97.6 F (36.4 C), temperature source Oral, resp. rate 18, height 5' 11.5" (1.816 m), weight 111.1 kg, SpO2 99 %.  Filed Weights   12/18/19 0625  Weight: 111.1 kg    Labs & Radiologic Studies    CBC Recent Labs    12/18/19 1656 12/19/19 0701  WBC 2.8* 2.8*  HGB 17.4* 17.7*  HCT 50.9 52.2*  MCV 86.9 88.0  PLT 209 397   Basic Metabolic Panel Recent Labs    12/18/19 0630  12/18/19 0630 12/18/19 1656 12/19/19 0701  NA 140  --   --  140  K 3.6  --   --  3.6  CL 106  --   --  105  CO2 25  --   --  25  GLUCOSE 91  --   --  92  BUN 19  --   --  17  CREATININE 1.59*   < > 1.66* 1.52*  CALCIUM 9.2  --   --  9.1  MG  --   --  2.2  --    < > = values in this interval not displayed.   Liver Function Tests No results for input(s): AST, ALT, ALKPHOS, BILITOT, PROT, ALBUMIN in the last 72 hours. No results for input(s): LIPASE, AMYLASE in the last 72 hours. Cardiac Enzymes No results for input(s): CKTOTAL, CKMB, CKMBINDEX, TROPONINI in the last 72 hours. BNP Invalid input(s): POCBNP D-Dimer No results for input(s): DDIMER in the last 72 hours. Hemoglobin A1C Recent Labs    12/18/19 1656    HGBA1C 5.3   Fasting Lipid Panel Recent Labs    12/19/19 0701  CHOL 155  HDL 34*  LDLCALC 103*  TRIG 89  CHOLHDL 4.6   Thyroid Function Tests Recent Labs    12/18/19 1656  TSH 0.960   _____________  DG Chest 2 View  Result Date: 12/18/2019 CLINICAL DATA:  Chest pain EXAM: CHEST - 2 VIEW COMPARISON:  07/18/2013 FINDINGS: The cardiac silhouette, mediastinal and hilar contours are within normal limits and stable. There is moderate tortuosity of the thoracic aorta. The lungs are clear. No pleural effusions or pulmonary lesions. The bony thorax is intact. IMPRESSION: No acute cardiopulmonary findings. Electronically Signed   By: Rudie Meyer M.D.   On: 12/18/2019 06:41   CT Head Wo Contrast  Result Date: 12/18/2019 CLINICAL DATA:  Left arm and hand numbness EXAM: CT HEAD WITHOUT CONTRAST TECHNIQUE: Contiguous axial images were obtained from the base of the skull through the vertex without intravenous contrast. COMPARISON:  None. FINDINGS: Brain: There is no acute intracranial hemorrhage, mass-effect, or edema. Gray-white differentiation is preserved. There is no extra-axial fluid collection. Ventricles and sulci are within normal limits in size and configuration. Vascular: Unremarkable. Skull: Calvarium is unremarkable. Sinuses/Orbits: Mild mucosal thickening.  Orbits are unremarkable. Other: None. IMPRESSION: No acute intracranial hemorrhage, mass effect, or evidence of acute infarction. Electronically Signed   By: Guadlupe Spanish M.D.   On: 12/18/2019 08:32   DG Shoulder Left  Result Date: 12/18/2019 CLINICAL DATA:  Chest pain radiating to shoulder EXAM: LEFT SHOULDER - 2+ VIEW COMPARISON:  None. FINDINGS: Alignment is anatomic. No acute fracture. Joint spaces are preserved. No intrinsic osseous lesion. IMPRESSION: No acute osseous abnormality. Electronically Signed   By: Guadlupe Spanish M.D.   On: 12/18/2019 08:27   CT Angio Chest/Abd/Pel for Dissection W and/or W/WO  Result Date:  12/18/2019 CLINICAL DATA:  Nonspecific chest pain EXAM: CT ANGIOGRAPHY CHEST, ABDOMEN AND PELVIS TECHNIQUE: Multidetector CT imaging through the chest, abdomen and pelvis was performed using the standard protocol during bolus administration of intravenous contrast. Multiplanar reconstructed images and MIPs were obtained and reviewed to evaluate the vascular anatomy. CONTRAST:  OMNIPAQUE IOHEXOL 350 MG/ML SOLN repeat bolus was requested after consultation between technologists and a different radiologist. COMPARISON:  10/03/2019 FINDINGS: CTA CHEST FINDINGS Cardiovascular: Noncontrast phase shows no intramural aortic hematoma. There is also no notable calcification. No aortic dissection. There is a 4 vessel arch branching pattern  with aberrant right subclavian artery. Aortic dimensions: *Valve 27 mm *Sinuses of Valsalva 46 mm *Sino-tubular junction 38 mm *Ascending segment 39 mm. The aorta is tortuous. On the first bolus there is good pulmonary artery opacification which is negative for filling defect. 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 There is history of dialysis catheter. Normal heart size and no pericardial effusion Mediastinum/Nodes: Negative for adenopathy or hematoma. Reticulation of fat anterior to the great vessels is from thymus and stable when compared to prior. No superimposed nodularity. Lungs/Pleura: There is no edema, consolidation, effusion, or pneumothorax. Musculoskeletal: No acute or aggressive finding. Review of the MIP images confirms the above findings. CTA ABDOMEN AND PELVIS FINDINGS VASCULAR Aorta: Tortuous but normal diameter and non atheromatous. No dissection. Celiac: Tortuous vessels without aneurysm or stenosis. SMA: Unremarkable Renals: High origin of the right renal artery. Both renal arteries are smooth and widely patent. IMA: Patent Inflow: Tortuosity without stenosis or atherosclerosis. Veins:  Negative Review of the MIP images confirms the above findings. NON-VASCULAR Hepatobiliary: 3 subcentimeter low densities in the liver, likely cystic although densitometry is limited by volume averaging.No evidence of biliary obstruction or stone. Pancreas: Unremarkable. Spleen: Unremarkable. Adrenals/Urinary Tract: Negative adrenals. No hydronephrosis or stone. Simple left renal cystic density. Unremarkable bladder. Stomach/Bowel:  No obstruction. No appendicitis. Vascular/Lymphatic: No acute vascular abnormality. No mass or adenopathy. Reproductive:Symmetrically enlarged prostate Other: No ascites or pneumoperitoneum. Musculoskeletal: No acute abnormalities.  Spondylosis. Review of the MIP images confirms the above findings. IMPRESSION: 1. No acute finding 2. 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 3. Left brachiocephalic vein stenosis Electronically Signed   By: Marnee Spring M.D.   On: 12/18/2019 09:16   Disposition   Pt is being discharged home today in good condition.  Follow-up Plans & Appointments    Follow-up Information    Rosalio Macadamia, NP Follow up on 01/01/2020.   Specialties: Nurse Practitioner, Interventional Cardiology, Cardiology, Radiology Why: 10:15am for your follow up appt.  Contact information: [address] [place] [address] [phone]          Discharge Instructions    Diet - low sodium heart healthy   Complete by: As directed    Increase activity slowly   Complete by: As directed      Discharge Medications     Medication List    STOP taking these medications   metoprolol 200 MG 24 hr tablet Commonly known as: TOPROL-XL     TAKE these medications   amLODipine 10 MG tablet Commonly known as: NORVASC Take 1 tablet (10 mg total) by mouth daily.   atorvastatin 40 MG tablet Commonly known as: LIPITOR Take 1 tablet (40 mg total) by mouth daily at 6 PM.   carvedilol 25 MG tablet Commonly known as:  COREG Take 1 tablet (25 mg total) by mouth 2 (two) times daily with a meal.   chlorthalidone 25 MG tablet Commonly known as: HYGROTON Take 1 tablet (25 mg total) by mouth daily. Start taking on: December 20, 2019   hydrALAZINE 25 MG tablet Commonly known as: APRESOLINE Take 1 tablet (25 mg total) by mouth 2 (two) times daily.   isosorbide dinitrate 20 MG tablet Commonly known as: ISORDIL Take 1 tablet (20 mg total) by mouth 2 (two) times daily.   lisinopril 20 MG tablet Commonly known as: ZESTRIL Take 20 mg by mouth daily.   tadalafil 20 MG  tablet Commonly known as: CIALIS Take 10-20 mg by mouth daily.       No                               Did the patient have a percutaneous coronary intervention (stent / angioplasty)?:  No.     Outstanding Labs/Studies   FLP/LFTs in 8 weeks.  Duration of Discharge Encounter   Greater than 30 minutes including physician time.  Signed, Laverda Page NP-C 12/19/2019, 4:54 PM

## 2019-12-27 NOTE — Progress Notes (Deleted)
CARDIOLOGY OFFICE NOTE  Date:  12/27/2019    Bryan Riggs Date of Birth: 12/21/1965 Medical Record #[id number]  PCP:  Alroy Dust, L.Marlou Sa, MD  Cardiologist:  Tamala Julian   No chief complaint on file.   History of Present Illness: Bryan Riggs is a 54 y.o. male who presents today for a work in/follow up visit.  Seen for Dr. Tamala Julian.   He has ahx of essential hypertension, sinus of Valsalva aneurysm (followed by Dr. Cyndia Bent), recurring chest discomfort, ED, and hyperlipidemia.  He did have a hospital admission in August 2019 that was felt to be related to depression and anxiety surrounding financial difficulties. He took a large quantity of Aleve, sleeping pills, and 1/2 cup of antifreeze mixed him [facility]. He developed acute kidney injury with creatinine rising to 9.2. He required dialysis temporarily. He was ultimately discharged from the hospital after a 10-day stay.  Last seen by Dr. Tamala Julian in November of 2019. Saw Cecilie Kicks, NP in December of 2019. He had missed his medicines but no other issues. Has been followed in the HTN clinic since.   In the hospital earlier this month - had missed his medicines and BP high. Has seen Dr. Cyndia Bent recently for his aneurysm - stable at 4.6 cm - unchanged from 2012. He had a Myoview - this was low risk with EF 45 to 54%. Several medicine changes occurred. Hydralazine and Isordil added back. Beta blocker changed to Coreg and HCTZ over to Chlorthalidone.   The patient {does/does not:200015} have symptoms concerning for COVID-19 infection (fever, chills, cough, or new shortness of breath).   Comes in today. Here with   Past Medical History:  Diagnosis Date  . Aberrant subclavian artery    RIGHT...PER CT CHEST...07/20/13  . AKI (acute kidney injury) ([place]) 06/2018  . 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 Chest pain CT ANGIO CHEST 07/20/13   LED TO DISCOVERY OF SINUS OF VALSALVA   ANEURYSM  . Erectile dysfunction    DR. NESI  . History of kidney stones   . Hypertension   . Kidney stone     Past Surgical History:  Procedure Laterality Date  . IR FLUORO GUIDE CV LINE RIGHT  06/23/2018  . IR REMOVAL TUN CV CATH W/O FL  06/30/2018  . IR US GUIDE VASC ACCESS RIGHT  06/23/2018  . KNEE ARTHROPLASTY    . NO PAST SURGERIES    . ROTATOR CUFF REPAIR Right      Medications: No outpatient medications have been marked as taking for the 01/01/20 encounter (Appointment) with Burtis Junes, NP.     Allergies: Allergies  Allergen Reactions  . Sulfasalazine Nausea And Vomiting  . Sulfa Antibiotics Rash and Nausea And Vomiting    Break out, itching  . Viagra [Sildenafil Citrate] Other (See Comments)    FLUSHING     Social History: The patient  reports that he has never smoked. He has never used smokeless tobacco. He reports current alcohol use. He reports that he does not use drugs.   Family History: The patient's ***family history includes Cancer in his brother, father, and mother; Hypertension in his father.   Review of Systems: Please see the history of present illness.   All other systems are reviewed and negative.   Physical Exam: VS:  There were no vitals taken for this visit. Marland Kitchen  BMI There is no height or weight on file  to calculate BMI.  Wt Readings from Last 3 Encounters:  10/03/19 248 lb (112.5 kg)  06/30/18 243 lb 12.8 oz (110.6 kg)  10/12/17 255 lb (115.7 kg)    General: Pleasant. Well developed, well nourished and in no acute distress.   HEENT: Normal.  Neck: Supple, no JVD, carotid bruits, or masses noted.  Cardiac: ***Regular rate and rhythm. No murmurs, rubs, or gallops. No edema.  Respiratory:  Lungs are clear to auscultation bilaterally with normal work of breathing.  GI: Soft and nontender.  MS: No deformity or atrophy. Gait and ROM intact.  Skin: Warm and dry. Color is normal.  Neuro:  Strength and sensation are intact and no gross  focal deficits noted.  Psych: Alert, appropriate and with normal affect.   LABORATORY DATA:  EKG:  EKG {ACTION; IS/IS MHD:[id number]} ordered today. This demonstrates ***.  Lab Results  Component Value Date   WBC 2.8 (L) 12/19/2019   HGB 17.7 (H) 12/19/2019   HCT 52.2 (H) 12/19/2019   PLT 185 12/19/2019   GLUCOSE 92 12/19/2019   CHOL 155 12/19/2019   TRIG 89 12/19/2019   HDL 34 (L) 12/19/2019   LDLCALC 103 (H) 12/19/2019   ALT 20 06/23/2018   AST 24 06/20/2018   NA 140 12/19/2019   K 3.6 12/19/2019   CL 105 12/19/2019   CREATININE 1.52 (H) 12/19/2019   BUN 17 12/19/2019   CO2 25 12/19/2019   TSH 0.960 12/18/2019   INR 1.0 12/18/2019   HGBA1C 5.3 12/18/2019     BNP (last 3 results) No results for input(s): BNP in the last 8760 hours.  ProBNP (last 3 results) No results for input(s): PROBNP in the last 8760 hours.   Other Studies Reviewed Today:  ECHO IMPRESSIONS 12/2019   1. Left ventricular ejection fraction, by visual estimation, is 60 to  65%. The left ventricle has normal function. There is mild left  ventricular hypertrophy.  2. Left ventricular diastolic parameters are consistent with Grade I  diastolic dysfunction (impaired relaxation).  3. The left ventricle has no regional wall motion abnormalities.  4. Global right ventricle has normal systolic function.The right  ventricular size is normal. No increase in right ventricular wall  thickness.  5. Left atrial size was normal.  6. Right atrial size was normal.  7. The mitral valve is normal in structure. No evidence of mitral valve  regurgitation.  8. The tricuspid valve is normal in structure. Tricuspid valve  regurgitation is not demonstrated.  9. The aortic valve is normal in structure. Aortic valve regurgitation is  not visualized.  10. The pulmonic valve was grossly normal. Pulmonic valve regurgitation is  not visualized.  11. There is mild dilatation of the aortic root measuring 40 mm.     Myoview Study Result 12/2019   There was no ST segment deviation noted during stress.  The left ventricular ejection fraction is mildly decreased (45-54%).  This is a low risk study.  TID with ratio 1.39. No perfusion defects seen. While TID could represent balanced ischemia, suspect may be due to significant hypertension following vasodilator administration, with BP up to 180/113.      ASSESSMENT AND PLAN:  1.  Hypertension, elevated today though he stated at home his BP is lower, he did miss meds yesterday.  Continue current meds and have him come back in 1-2 weeks to recheck BP.  2.  Aneurysm of aortic sinus of Valsalva - followed by Dr. Laneta Simmers and stable  3. Hx of  aberrant subclavian artery.   Follow up with Dr. Katrinka Blazing in 6 months.    . COVID-19 Education: The signs and symptoms of COVID-19 were discussed with the patient and how to seek care for testing (follow up with PCP or arrange E-visit).  The importance of social distancing, staying at home, hand hygiene and wearing a mask when out in public were discussed today.  Current medicines are reviewed with the patient today.  The patient does not have concerns regarding medicines other than what has been noted above.  The following changes have been made:  See above.  Labs/ tests ordered today include:   No orders of the defined types were placed in this encounter.    Disposition:   FU with *** in {gen number 3-74:827078} {Days to years:10300}.   Patient is agreeable to this plan and will call if any problems develop in the interim.   SignedNorma Fredrickson, NP  12/27/2019 7:28 PM  [facility] [address] [place], [place]  [address] Phone: [phone] Fax: [phone]

## 2019-12-28 DIAGNOSIS — N401 Enlarged prostate with lower urinary tract symptoms: Secondary | ICD-10-CM | POA: Diagnosis not present

## 2019-12-28 DIAGNOSIS — R3911 Hesitancy of micturition: Secondary | ICD-10-CM | POA: Diagnosis not present

## 2019-12-28 DIAGNOSIS — N5201 Erectile dysfunction due to arterial insufficiency: Secondary | ICD-10-CM | POA: Diagnosis not present

## 2019-12-28 DIAGNOSIS — R3912 Poor urinary stream: Secondary | ICD-10-CM | POA: Diagnosis not present

## 2020-01-01 ENCOUNTER — Ambulatory Visit: Payer: BC Managed Care – PPO | Admitting: Nurse Practitioner

## 2020-01-12 ENCOUNTER — Ambulatory Visit: Payer: BC Managed Care – PPO | Attending: Internal Medicine

## 2020-01-12 DIAGNOSIS — Z23 Encounter for immunization: Secondary | ICD-10-CM

## 2020-01-12 NOTE — Progress Notes (Signed)
   Covid-19 Vaccination Clinic  Name:  BHAVESH VAZQUEZ    MRN: [id number] DOB: 19-Nov-1965  01/12/2020  Mr. Hankerson was observed post Covid-19 immunization for 15 minutes without incidence. He was provided with Vaccine Information Sheet and instruction to access the V-Safe system.   Mr. Abercrombie was instructed to call 911 with any severe reactions post vaccine: Marland Kitchen Difficulty breathing  . Swelling of your face and throat  . A fast heartbeat  . A bad rash all over your body  . Dizziness and weakness    Immunizations Administered    Name Date Dose VIS Date Route   Pfizer COVID-19 Vaccine 01/12/2020  1:03 PM 0.3 mL 10/26/2019 Intramuscular   Manufacturer: [employer], [employer]   Lot: [id number]   NDC: [address]-3

## 2020-01-15 ENCOUNTER — Other Ambulatory Visit: Payer: Self-pay | Admitting: Cardiology

## 2020-01-25 DIAGNOSIS — R3911 Hesitancy of micturition: Secondary | ICD-10-CM | POA: Diagnosis not present

## 2020-01-25 DIAGNOSIS — N401 Enlarged prostate with lower urinary tract symptoms: Secondary | ICD-10-CM | POA: Diagnosis not present

## 2020-01-30 DIAGNOSIS — R972 Elevated prostate specific antigen [PSA]: Secondary | ICD-10-CM | POA: Diagnosis not present

## 2020-01-30 DIAGNOSIS — N400 Enlarged prostate without lower urinary tract symptoms: Secondary | ICD-10-CM | POA: Diagnosis not present

## 2020-02-02 ENCOUNTER — Ambulatory Visit: Payer: BC Managed Care – PPO | Attending: Internal Medicine

## 2020-02-02 DIAGNOSIS — Z23 Encounter for immunization: Secondary | ICD-10-CM

## 2020-02-02 NOTE — Progress Notes (Signed)
   Covid-19 Vaccination Clinic  Name:  KAUAN KLOOSTERMAN    MRN: [id number] DOB: [birth year]  02/02/2020  Mr. Capri was observed post Covid-19 immunization for 15 minutes without incident. He was provided with Vaccine Information Sheet and instruction to access the V-Safe system.   Mr. Keadle was instructed to call 911 with any severe reactions post vaccine: Marland Kitchen Difficulty breathing  . Swelling of face and throat  . A fast heartbeat  . A bad rash all over body  . Dizziness and weakness   Immunizations Administered    Name Date Dose VIS Date Route   Pfizer COVID-19 Vaccine 02/02/2020  9:04 AM 0.3 mL 10/26/2019 Intramuscular   Manufacturer: [employer], [employer]   Lot: [id number]   NDC: [address]-0

## 2020-02-09 NOTE — Progress Notes (Signed)
Cardiology Office Note:    Date:  02/11/2020   [name], DOB [birth year], MRN [id number]  PCP:  Aurea Graff.Marlou Sa, MD  Cardiologist:  Sinclair Grooms, MD   Referring MD: Aurea Graff.Marlou Sa, MD   Chief Complaint  Patient presents with  . Thoracic Aortic Aneurysm  . Hypertension    History of Present Illness:    Bryan Riggs is a 54 y.o. male with a hx of essential hypertension, sinus of Valsalva aneurysm, recurring chest discomfort, and hyperlipidemia.  He is doing well.  Had arm and shoulder discomfort involving the left side in February.  Complete evaluation in the hospital was unremarkable.  He has felt well since that time.  No recurrent arm discomfort.  Denies chest pain.  He denies angina.  He is not having orthopnea.  No edema or exertional intolerance.  Denies claudication.  No neurological complaints.  Past Medical History:  Diagnosis Date  . Aberrant subclavian artery    RIGHT...PER CT CHEST...07/20/13  . AKI (acute kidney injury) ([place]) 06/2018  . 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 Chest pain CT ANGIO CHEST 07/20/13   LED TO DISCOVERY OF SINUS OF VALSALVA  ANEURYSM  . Erectile dysfunction    DR. NESI  . History of kidney stones   . Hypertension   . Kidney stone     Past Surgical History:  Procedure Laterality Date  . IR FLUORO GUIDE CV LINE RIGHT  06/23/2018  . IR REMOVAL TUN CV CATH W/O FL  06/30/2018  . IR US GUIDE VASC ACCESS RIGHT  06/23/2018  . KNEE ARTHROPLASTY    . NO PAST SURGERIES    . ROTATOR CUFF REPAIR Right     Current Medications: Current Meds  Medication Sig  . amLODipine (NORVASC) 10 MG tablet Take 1 tablet (10 mg total) by mouth daily.  Marland Kitchen lisinopril (ZESTRIL) 20 MG tablet Take 20 mg by mouth daily.  . metoprolol succinate (TOPROL-XL) 50 MG 24 hr tablet Take 25 mg by mouth daily. Take with or immediately following a meal.  . tadalafil (CIALIS) 20 MG tablet Take 10-20 mg  by mouth daily as needed.      Allergies:   Sulfasalazine, Sulfa antibiotics, and Viagra [sildenafil citrate]   Social History   Socioeconomic History  . Marital status: Divorced    Spouse name: Not on file  . Number of children: Not on file  . Years of education: Not on file  . Highest education level: Not on file  Occupational History  . Not on file  Tobacco Use  . Smoking status: Never Smoker  . Smokeless tobacco: Never Used  Substance and Sexual Activity  . Alcohol use: Yes    Comment: RARE  . Drug use: No  . Sexual activity: Not on file  Other Topics Concern  . Not on file  Social History Narrative  . Not on file   Social Determinants of Health   Financial Resource Strain:   . Difficulty of Paying Living Expenses:   Food Insecurity:   . Worried About Charity fundraiser in the Last Year:   . Arboriculturist in the Last Year:   Transportation Needs:   . Film/video editor (Medical):   Marland Kitchen Lack of Transportation (Non-Medical):   Physical Activity:   . Days of Exercise per Week:   . Minutes of Exercise per Session:   Stress:   .  Feeling of Stress :   Social Connections:   . Frequency of Communication with Friends and Family:   . Frequency of Social Gatherings with Friends and Family:   . Attends Religious Services:   . Active Member of Clubs or Organizations:   . Attends Banker Meetings:   Marland Kitchen Marital Status:      Family History: The patient's family history includes Cancer in his brother, father, and mother; Hypertension in his father.  ROS:   Please see the history of present illness.    Is been out of Zestril for 1 week.  Has not had it today.  This probably accounts for some elevation in today's blood pressure.  All other systems reviewed and are negative.  EKGs/Labs/Other Studies Reviewed:    The following studies were reviewed today:  2D Doppler echocardiogram February 2021: IMPRESSIONS    1. Left ventricular ejection fraction,  by visual estimation, is 60 to  65%. The left ventricle has normal function. There is mild left  ventricular hypertrophy.  2. Left ventricular diastolic parameters are consistent with Grade I  diastolic dysfunction (impaired relaxation).  3. The left ventricle has no regional wall motion abnormalities.  4. Global right ventricle has normal systolic function.The right  ventricular size is normal. No increase in right ventricular wall  thickness.  5. Left atrial size was normal.  6. Right atrial size was normal.  7. The mitral valve is normal in structure. No evidence of mitral valve  regurgitation.  8. The tricuspid valve is normal in structure. Tricuspid valve  regurgitation is not demonstrated.  9. The aortic valve is normal in structure. Aortic valve regurgitation is  not visualized.  10. The pulmonic valve was grossly normal. Pulmonic valve regurgitation is  not visualized.  11. There is mild dilatation of the aortic root measuring 40 mm.   Chest CT with contrast February 2021:  IMPRESSION: 1. No acute finding 2. 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 3. Left brachiocephalic vein stenosis   EKG:  EKG performed February 2021 revealed inferior Q waves unchanged from prior and prominent voltage.  Sinus rhythm.  Recent Labs: 12/18/2019: Magnesium 2.2; TSH 0.960 12/19/2019: BUN 17; Creatinine, Ser 1.52; Hemoglobin 17.7; Platelets 185; Potassium 3.6; Sodium 140  Recent Lipid Panel    Component Value Date/Time   CHOL 155 12/19/2019 0701   TRIG 89 12/19/2019 0701   HDL 34 (L) 12/19/2019 0701   CHOLHDL 4.6 12/19/2019 0701   VLDL 18 12/19/2019 0701   LDLCALC 103 (H) 12/19/2019 0701    Physical Exam:    VS:  BP (!) 140/92   Pulse 64   Ht 5' 11.5" (1.816 m)   Wt 251 lb (113.9 kg)   SpO2 96%   BMI 34.52 kg/m     Wt Readings from Last 3 Encounters:  02/11/20 251 lb (113.9 kg)  10/03/19 248 lb (112.5 kg)  06/30/18 243 lb 12.8 oz (110.6 kg)       GEN: Healthy-appearing. No acute distress HEENT: Normal NECK: No JVD. LYMPHATICS: No lymphadenopathy CARDIAC:  RRR without murmur, gallop, or edema. VASCULAR:  Normal Pulses. No bruits. RESPIRATORY:  Clear to auscultation without rales, wheezing or rhonchi  ABDOMEN: Soft, non-tender, non-distended, No pulsatile mass, MUSCULOSKELETAL: No deformity  SKIN: Warm and dry NEUROLOGIC:  Alert and oriented x 3 PSYCHIATRIC:  Normal affect   ASSESSMENT:    1. 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 Essential hypertension  3. Aberrant subclavian artery   4. Erectile dysfunction due to arterial insufficiency   5. Educated about COVID-19 virus infection    PLAN:    In order of problems listed above:  1. Aneurysm/aorta diameter is stable.  Will be seeing Dr. Laneta Simmers in November. 2. Continue excellent blood pressure control.  Blood pressure is slightly elevated today because Zestril has not been taken for over a week.  Target 130/80 mmHg. 3. Again noted on CT scan performed in February. 4. Not discussed 5. Covid 19 vaccination has been received.  Social distancing is being practiced.  Overall education and awareness concerning primary risk prevention was discussed in detail: LDL less than 70, hemoglobin A1c less than 7, blood pressure target less than 130/80 mmHg, >150 minutes of moderate aerobic activity per week, avoidance of smoking, weight control (via diet and exercise), and continued surveillance/management of/for obstructive sleep apnea.    Medication Adjustments/Labs and Tests Ordered: Current medicines are reviewed at length with the patient today.  Concerns regarding medicines are outlined above.  No orders of the defined types were placed in this encounter.  No orders of the defined types were placed in this encounter.   Patient Instructions  Medication Instructions:  Your physician recommends that you continue on your current medications as directed.  Please refer to the Current Medication list given to you today.  *If you need a refill on your cardiac medications before your next appointment, please call your pharmacy*   Lab Work: None If you have labs (blood work) drawn today and your tests are completely normal, you will receive your results only by: Marland Kitchen MyChart Message (if you have MyChart) OR . A paper copy in the mail If you have any lab test that is abnormal or we need to change your treatment, we will call you to review the results.   Testing/Procedures: None   Follow-Up: At [facility], you and your health needs are our priority.  As part of our continuing mission to provide you with exceptional heart care, we have created designated Provider Care Teams.  These Care Teams include your primary Cardiologist (physician) and Advanced Practice Providers (APPs -  Physician Assistants and Nurse Practitioners) who all work together to provide you with the care you need, when you need it.  We recommend signing up for the patient portal called "MyChart".  Sign up information is provided on this After Visit Summary.  MyChart is used to connect with patients for Virtual Visits (Telemedicine).  Patients are able to view lab/test results, encounter notes, upcoming appointments, etc.  Non-urgent messages can be sent to your provider as well.   To learn more about what you can do with MyChart, go to [email or web address].    Your next appointment:   12 month(s)  The format for your next appointment:   In Person  Provider:   You may see Lesleigh Noe, MD or one of the following Advanced Practice Providers on your designated Care Team:    Norma Fredrickson, NP  Nada Boozer, NP  Georgie Chard, NP    Other Instructions      Signed, Lesleigh Noe, MD  02/11/2020 5:21 PM    [facility]

## 2020-02-11 ENCOUNTER — Encounter: Payer: Self-pay | Admitting: Interventional Cardiology

## 2020-02-11 ENCOUNTER — Other Ambulatory Visit: Payer: Self-pay

## 2020-02-11 ENCOUNTER — Ambulatory Visit (INDEPENDENT_AMBULATORY_CARE_PROVIDER_SITE_OTHER): Payer: BC Managed Care – PPO | Admitting: Interventional Cardiology

## 2020-02-11 VITALS — BP 140/92 | HR 64 | Ht 71.5 in | Wt 251.0 lb

## 2020-02-11 DIAGNOSIS — N5201 Erectile dysfunction due to arterial insufficiency: Secondary | ICD-10-CM | POA: Diagnosis not present

## 2020-02-11 DIAGNOSIS — Q2543 Congenital aneurysm of aorta: Secondary | ICD-10-CM

## 2020-02-11 DIAGNOSIS — I10 Essential (primary) hypertension: Secondary | ICD-10-CM

## 2020-02-11 DIAGNOSIS — Q278 Other specified congenital malformations of peripheral vascular system: Secondary | ICD-10-CM

## 2020-02-11 DIAGNOSIS — Z7189 Other specified counseling: Secondary | ICD-10-CM

## 2020-02-11 NOTE — Patient Instructions (Signed)

## 2020-02-13 ENCOUNTER — Ambulatory Visit: Payer: Self-pay

## 2020-04-08 DIAGNOSIS — Z20822 Contact with and (suspected) exposure to covid-19: Secondary | ICD-10-CM | POA: Diagnosis not present

## 2020-04-30 DIAGNOSIS — N400 Enlarged prostate without lower urinary tract symptoms: Secondary | ICD-10-CM | POA: Diagnosis not present

## 2020-05-07 DIAGNOSIS — N400 Enlarged prostate without lower urinary tract symptoms: Secondary | ICD-10-CM | POA: Diagnosis not present

## 2020-05-07 DIAGNOSIS — R972 Elevated prostate specific antigen [PSA]: Secondary | ICD-10-CM | POA: Diagnosis not present

## 2020-06-18 DIAGNOSIS — R972 Elevated prostate specific antigen [PSA]: Secondary | ICD-10-CM | POA: Diagnosis not present

## 2020-06-25 DIAGNOSIS — R361 Hematospermia: Secondary | ICD-10-CM | POA: Diagnosis not present

## 2020-06-25 DIAGNOSIS — N400 Enlarged prostate without lower urinary tract symptoms: Secondary | ICD-10-CM | POA: Diagnosis not present

## 2020-06-25 DIAGNOSIS — N4232 Atypical small acinar proliferation of prostate: Secondary | ICD-10-CM | POA: Diagnosis not present

## 2020-06-25 DIAGNOSIS — R972 Elevated prostate specific antigen [PSA]: Secondary | ICD-10-CM | POA: Diagnosis not present

## 2020-07-31 IMAGING — CT CT RENAL STONE PROTOCOL
2 of 4 series · 16 of 46 positions shown, 18 images · non-contrast
Comparison: CT scan of October 02, 2012.

CLINICAL DATA: Acute right flank pain.

EXAM:
CT ABDOMEN AND PELVIS WITHOUT CONTRAST
TECHNIQUE: Multidetector CT imaging of the abdomen and pelvis was performed
following the standard protocol without IV contrast.

[Series 3: renal stone 5.0 · axial · 0.85mm/px · z∈[+702,+1152]mm · 13 of 102 slices shown, 15 images]
[im 8/102  soft-tissue]
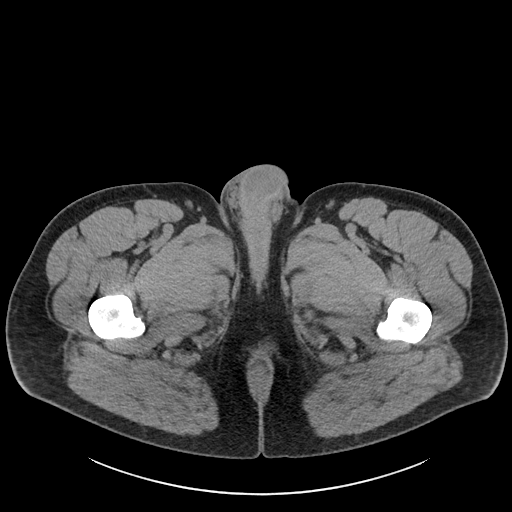
[im 8/102  bone]
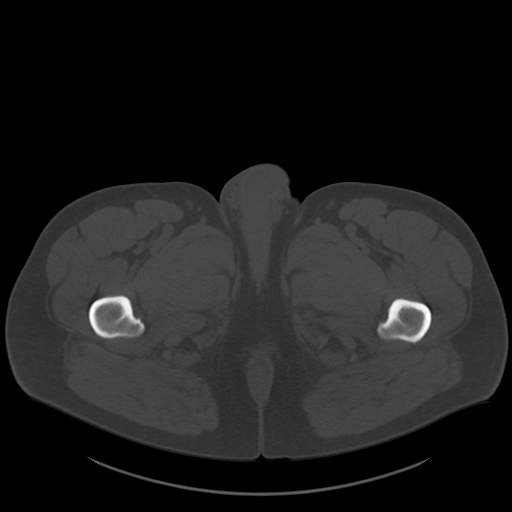
[im 15/102  soft-tissue]
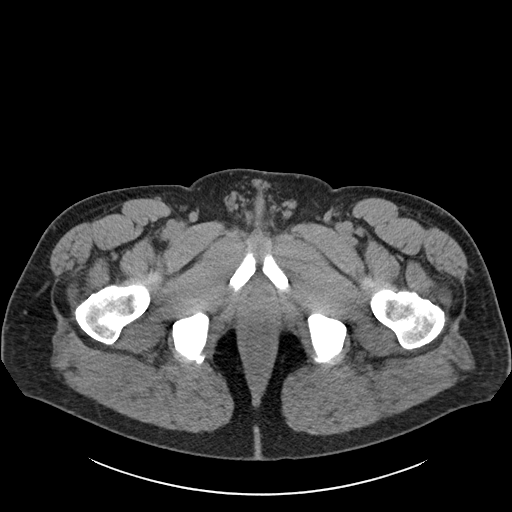
[im 23/102  soft-tissue]
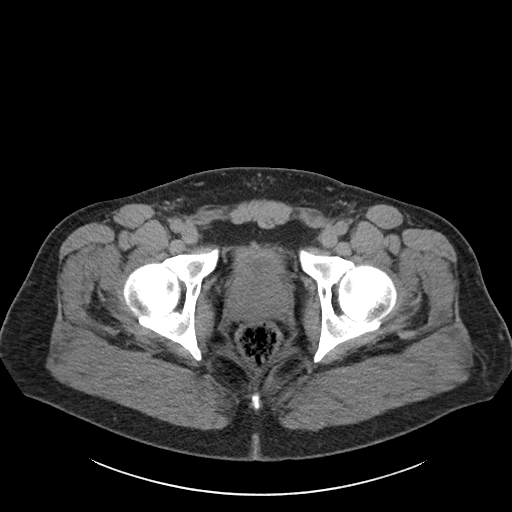
[im 30/102  soft-tissue]
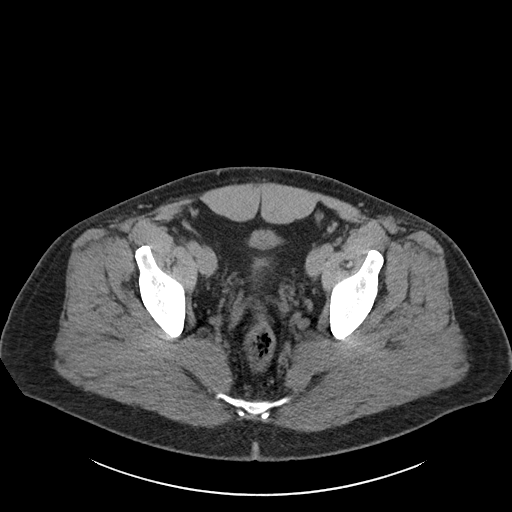
[im 38/102  soft-tissue]
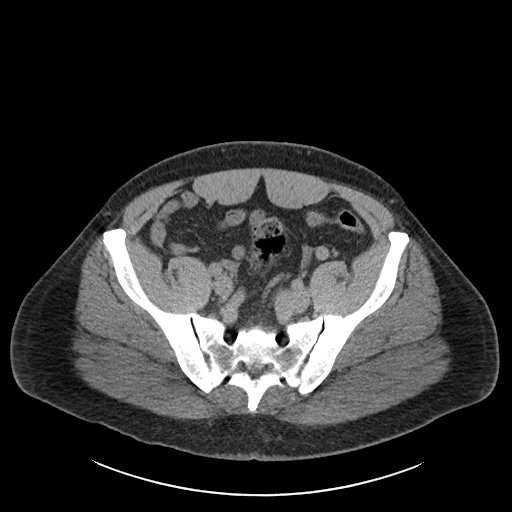
[im 45/102  soft-tissue]
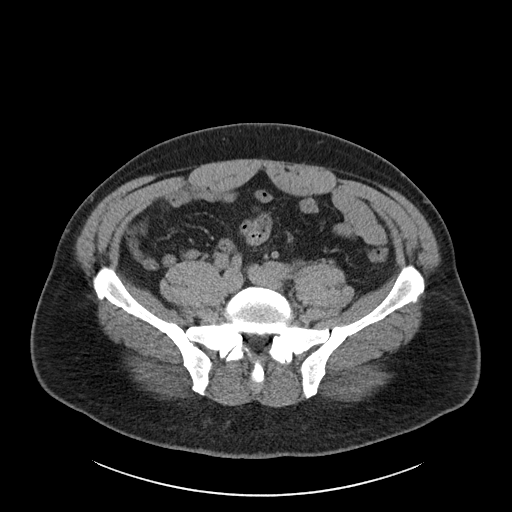
[im 53/102  soft-tissue]
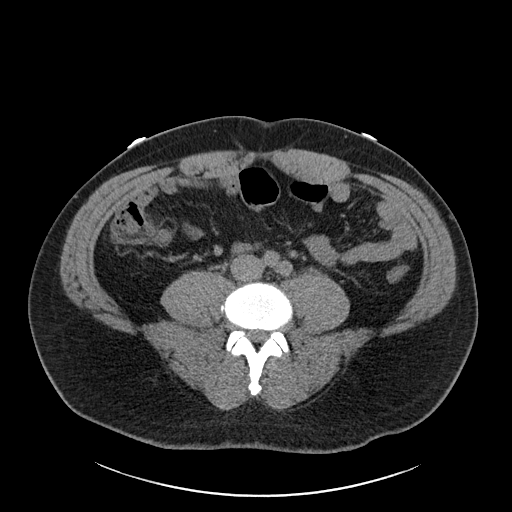
[im 60/102  soft-tissue]
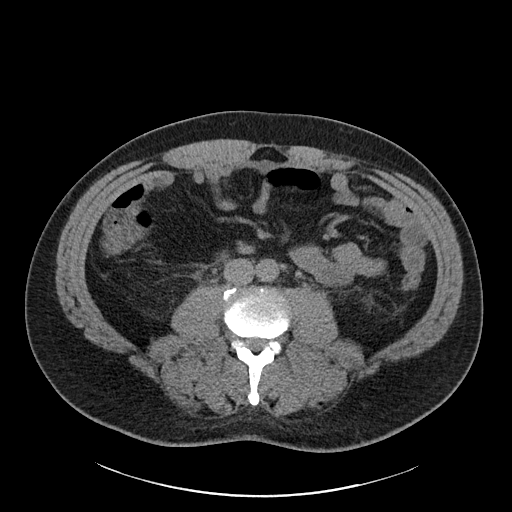
[im 68/102  soft-tissue]
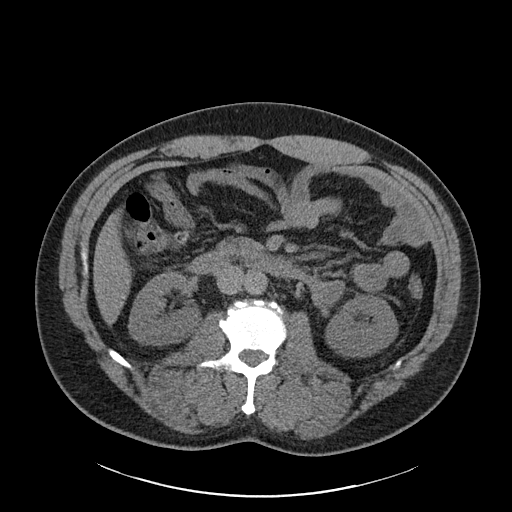
[im 68/102  bone]
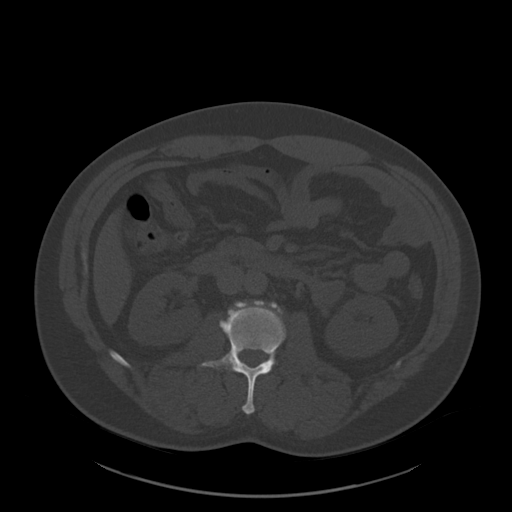
[im 75/102  soft-tissue]
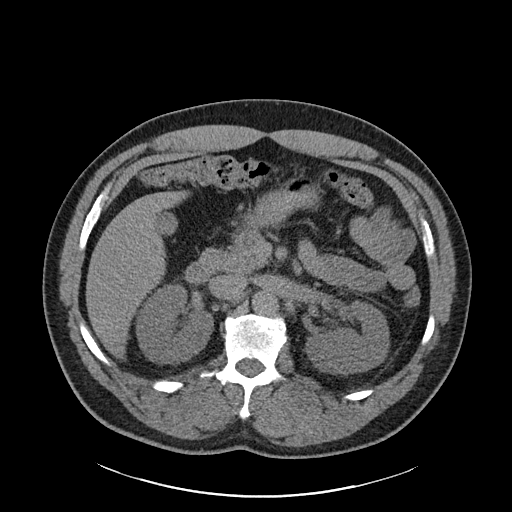
[im 83/102  soft-tissue]
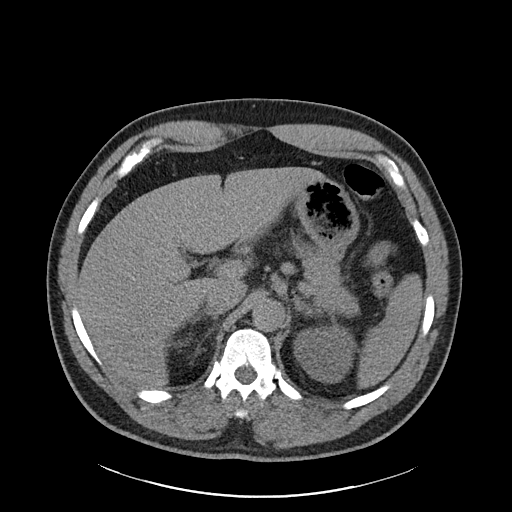
[im 90/102  soft-tissue]
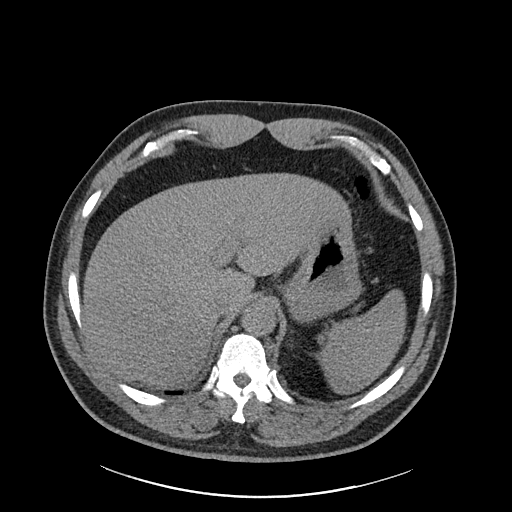
[im 98/102  soft-tissue]
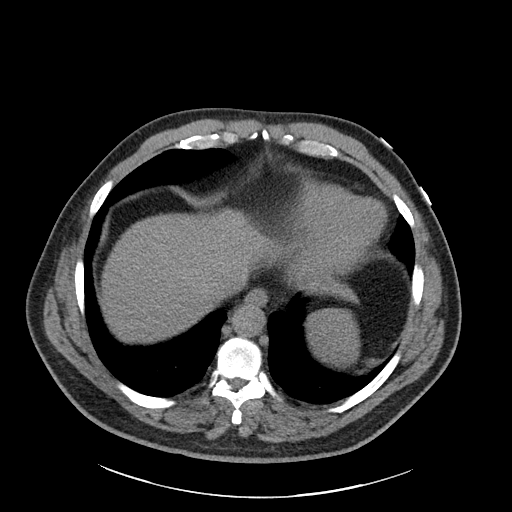

[Series 6: renal stone 3.0 cor · coronal · 0.81mm/px · 3 of 123 slices shown]
[im 41/123  soft-tissue]
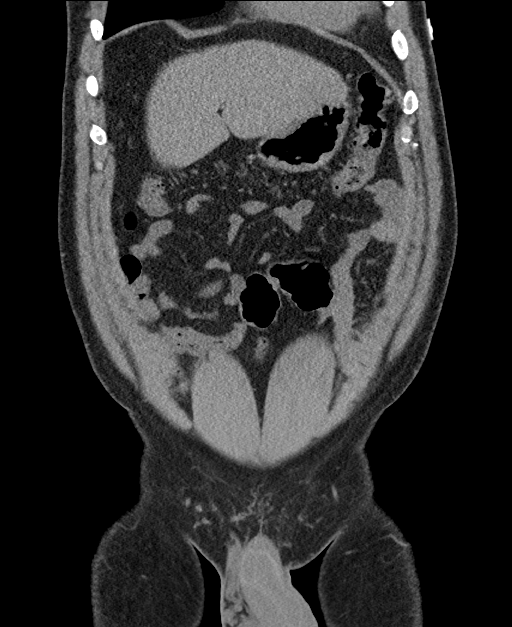
[im 55/123  soft-tissue]
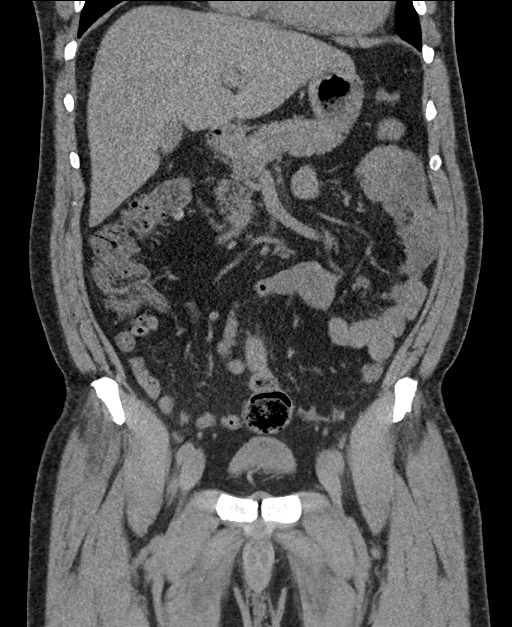
[im 68/123  soft-tissue]
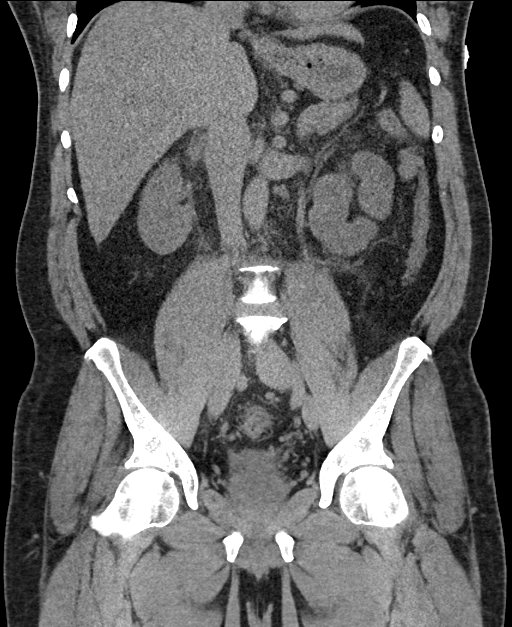

[16 of 46 positions shown; findings below may reference images not displayed]

FINDINGS: Lower chest: No acute abnormality.

Hepatobiliary: No focal liver abnormality is seen. No gallstones,
gallbladder wall thickening, or biliary dilatation.

Pancreas: Unremarkable. No pancreatic ductal dilatation or
surrounding inflammatory changes.

Spleen: Normal in size without focal abnormality.

Adrenals/Urinary Tract: Adrenal glands are unremarkable. Kidneys are
normal, without renal calculi, focal lesion, or hydronephrosis.
Bladder is unremarkable.

Stomach/Bowel: Stomach is within normal limits. Appendix appears
normal. No evidence of bowel wall thickening, distention, or
inflammatory changes.

Vascular/Lymphatic: No significant vascular findings are present. No
enlarged abdominal or pelvic lymph nodes.

Reproductive: Prostate is unremarkable.

Other: No abdominal wall hernia or abnormality. No abdominopelvic
ascites.

Musculoskeletal: No acute or significant osseous findings.
IMPRESSION: No definite abnormality seen in the abdomen or pelvis.

## 2020-08-03 IMAGING — XA IR FLUORO GUIDE CV LINE*R*
1 series · 1 of 1 positions shown · non-contrast
Comparison: none

CLINICAL DATA: Acute renal failure requiring hemodialysis.

[Series 1: fl (-) angio · 1 of 1 slices shown]
[im 1/1]
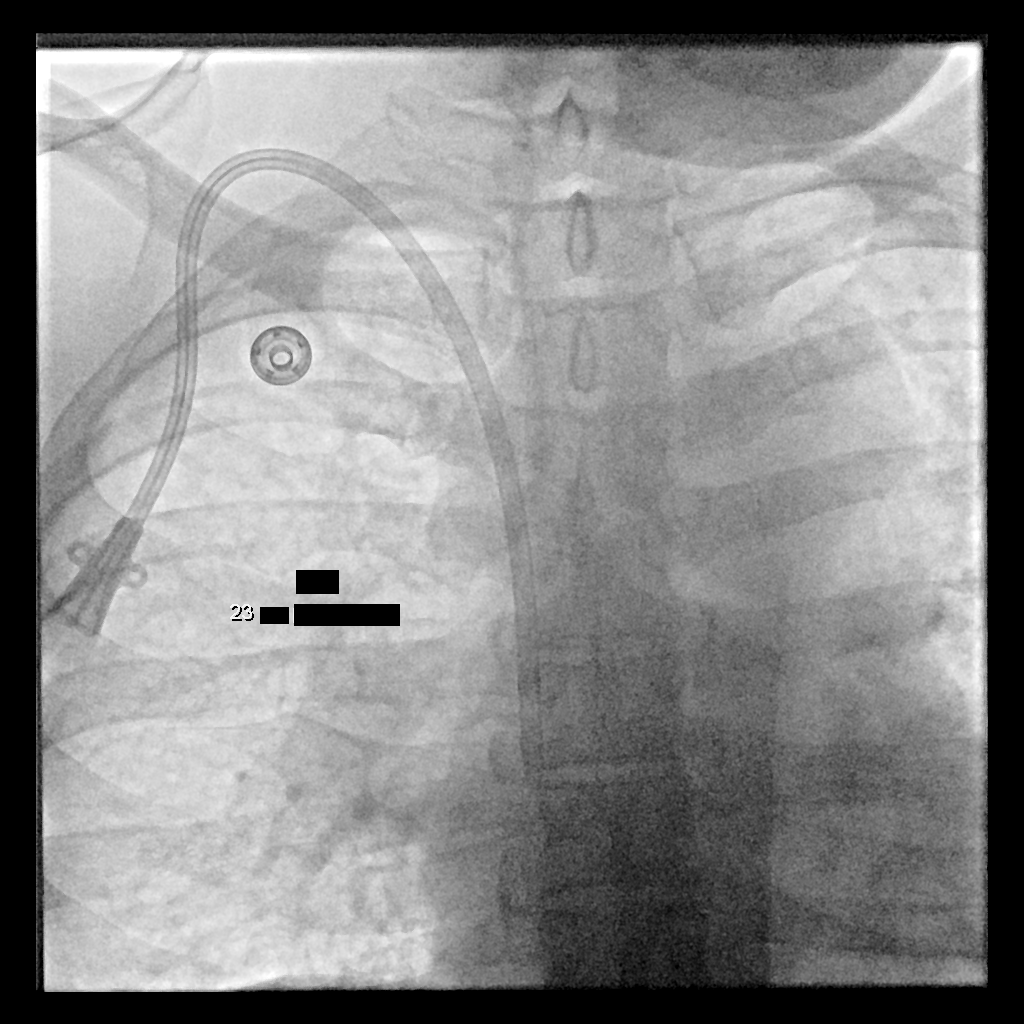

[1 of 1 positions shown; findings below may reference images not displayed]

EXAM:
TUNNELED CENTRAL VENOUS HEMODIALYSIS CATHETER PLACEMENT WITH
ULTRASOUND AND FLUOROSCOPIC GUIDANCE

ANESTHESIA/SEDATION:
1.5 mg IV Versed; 75 mcg IV Fentanyl.

Total Moderate Sedation Time:   25 minutes.

The patient's level of consciousness and physiologic status were
continuously monitored during the procedure by Radiology nursing.

MEDICATIONS:
2 g IV Ancef.

FLUOROSCOPY TIME:  30 seconds.  6.6 mGy.

PROCEDURE:
The procedure, risks, benefits, and alternatives were explained to
the patient. Questions regarding the procedure were encouraged and
answered. The patient understands and consents to the procedure. A
timeout was performed prior to initiating the procedure.

The right neck and chest were prepped with chlorhexidine in a
sterile fashion, and a sterile drape was applied covering the
operative field. Maximum barrier sterile technique with sterile
gowns and gloves were used for the procedure. Local anesthesia was
provided with 1% lidocaine.

Ultrasound was used to confirm patency of the right internal jugular
vein. After creating a small venotomy incision, a 21 gauge needle
was advanced into the right internal jugular vein under direct,
real-time ultrasound guidance. Ultrasound image documentation was
performed. After securing guidewire access, an 8 Fr dilator was
placed. A J-wire was kinked to measure appropriate catheter length.

A Palindrome tunneled hemodialysis catheter measuring 23 cm from tip
to cuff was chosen for placement. This was tunneled in a retrograde
fashion from the chest wall to the venotomy incision.

At the venotomy, serial dilatation was performed and a 16 Fr
peel-away sheath was placed over a guidewire. The catheter was then
placed through the sheath and the sheath removed. Final catheter
positioning was confirmed and documented with a fluoroscopic spot
image. The catheter was aspirated, flushed with saline, and injected
with appropriate volume heparin dwells.

The venotomy incision was closed with subcuticular 4-0 Vicryl.
Dermabond was applied to the incision. The catheter exit site was
secured with 0-Prolene retention sutures.

COMPLICATIONS:
None.  No pneumothorax.
FINDINGS: After catheter placement, the tip lies in the right atrium. The
catheter aspirates normally and is ready for immediate use.
IMPRESSION: Placement of tunneled hemodialysis catheter via the right internal
jugular vein. The catheter tip lies in the right atrium. The
catheter is ready for immediate use.

## 2020-09-03 DIAGNOSIS — I10 Essential (primary) hypertension: Secondary | ICD-10-CM | POA: Diagnosis not present

## 2020-09-03 DIAGNOSIS — N529 Male erectile dysfunction, unspecified: Secondary | ICD-10-CM | POA: Diagnosis not present

## 2020-09-03 DIAGNOSIS — E7800 Pure hypercholesterolemia, unspecified: Secondary | ICD-10-CM | POA: Diagnosis not present

## 2020-09-10 ENCOUNTER — Other Ambulatory Visit: Payer: BC Managed Care – PPO

## 2020-09-10 DIAGNOSIS — N183 Chronic kidney disease, stage 3 unspecified: Secondary | ICD-10-CM | POA: Diagnosis not present

## 2020-09-10 DIAGNOSIS — I129 Hypertensive chronic kidney disease with stage 1 through stage 4 chronic kidney disease, or unspecified chronic kidney disease: Secondary | ICD-10-CM | POA: Diagnosis not present

## 2020-09-22 ENCOUNTER — Telehealth: Payer: Self-pay | Admitting: Interventional Cardiology

## 2020-09-22 MED ORDER — AMLODIPINE BESYLATE 10 MG PO TABS: 10.0000 mg | ORAL_TABLET | Freq: Every day | ORAL | 1 refills | Status: DC

## 2020-09-22 NOTE — Telephone Encounter (Signed)
Pt's medication was sent to pt's pharmacy as requested. Confirmation received.  °

## 2020-09-22 NOTE — Telephone Encounter (Signed)
*  STAT* If patient is at the pharmacy, call can be transferred to refill team.   1. Which medications need to be refilled? (please list name of each medication and dose if known) amLODipine (NORVASC) 10 MG tablet  2. Which pharmacy/location (including street and city if local pharmacy) is medication to be sent to? [facility] - [address] [place], [place], [place] [address]  3. Do they need a 30 day or 90 day supply? 90 day supply

## 2020-09-29 ENCOUNTER — Telehealth: Payer: Self-pay | Admitting: Interventional Cardiology

## 2020-09-29 ENCOUNTER — Other Ambulatory Visit: Payer: Self-pay

## 2020-09-29 MED ORDER — METOPROLOL SUCCINATE ER 50 MG PO TB24: 25.0000 mg | ORAL_TABLET | Freq: Every day | ORAL | 1 refills | Status: DC

## 2020-09-29 NOTE — Telephone Encounter (Signed)
*  STAT* If patient is at the pharmacy, call can be transferred to refill team.   1. Which medications need to be refilled? (please list name of each medication and dose if known) metoprolol succinate (TOPROL-XL) 50 MG 24 hr tablet   2. Which pharmacy/location (including street and city if local pharmacy) is medication to be sent to?  [id number] - [place], [place] - [address]    3. Do they need a 30 day or 90 day supply? 90  

## 2020-11-25 DIAGNOSIS — Z1152 Encounter for screening for COVID-19: Secondary | ICD-10-CM | POA: Diagnosis not present

## 2020-12-10 DIAGNOSIS — I10 Essential (primary) hypertension: Secondary | ICD-10-CM | POA: Diagnosis not present

## 2020-12-10 DIAGNOSIS — N183 Chronic kidney disease, stage 3 unspecified: Secondary | ICD-10-CM | POA: Diagnosis not present

## 2021-03-04 DIAGNOSIS — E7800 Pure hypercholesterolemia, unspecified: Secondary | ICD-10-CM | POA: Diagnosis not present

## 2021-03-04 DIAGNOSIS — I10 Essential (primary) hypertension: Secondary | ICD-10-CM | POA: Diagnosis not present

## 2021-03-04 DIAGNOSIS — N529 Male erectile dysfunction, unspecified: Secondary | ICD-10-CM | POA: Diagnosis not present

## 2021-03-24 ENCOUNTER — Other Ambulatory Visit: Payer: Self-pay | Admitting: Interventional Cardiology

## 2021-04-03 ENCOUNTER — Telehealth: Payer: Self-pay | Admitting: Interventional Cardiology

## 2021-04-03 NOTE — Telephone Encounter (Signed)
Called pt to inform him that his medication was already at his pharmacy. I called his pharmacy as well and they stated that pt's medication was ready to be picked up. I advised the pt that if he has any other problems, questions or concerns, to give our office a call back. Pt verbalized understanding.

## 2021-04-03 NOTE — Telephone Encounter (Signed)
*  STAT* If patient is at the pharmacy, call can be transferred to refill team.   1. Which medications need to be refilled? (please list name of each medication and dose if known)  amLODipine (NORVASC) 10 MG tablet 2. Which pharmacy/location (including street and city if local pharmacy) is medication to be sent to?[id number] - [place], [place] - [address]  3. Do they need a 30 day or 90 day supply? 90 day supply   PT is all out of this medication.He also states the medication needs a PA

## 2021-05-11 ENCOUNTER — Other Ambulatory Visit: Payer: Self-pay

## 2021-05-11 NOTE — Telephone Encounter (Signed)
Received a call from the patient requesting a refill on all his medication. He states he was told by the pharmacy that we have denied all his medication.   Reviewed the patients chart and he has not been seen since 01/2020. I advised patient that I would send a message to Dr Michaelle Copas nurse about the refill request but he also needs to schedule a follow up appointment. He states he called not to long ago and Dr Katrinka Blazing did not have any openings until September or October. I advised patient to at least get an appointment scheduled. He voiced understanding and I transferred him to the back main line and advised him to pick the option to schedule a follow up appointment.

## 2021-05-11 NOTE — Telephone Encounter (Signed)
Cialis needs to come from PCP.  All others are ok to fill until time of his appt once made.

## 2021-05-12 NOTE — Telephone Encounter (Signed)
Pt has an upcoming appt in Novermber 2022 with Dr. Katrinka Blazing. Does pt need to be seen sooner or will Dr. Katrinka Blazing refill medications until appt time in November 2022? Please address

## 2021-05-13 MED ORDER — LISINOPRIL 20 MG PO TABS: 20.0000 mg | ORAL_TABLET | Freq: Every day | ORAL | 1 refills | Status: DC

## 2021-05-13 MED ORDER — METOPROLOL SUCCINATE ER 50 MG PO TB24: 25.0000 mg | ORAL_TABLET | Freq: Every day | ORAL | 1 refills | Status: DC

## 2021-05-13 MED ORDER — AMLODIPINE BESYLATE 10 MG PO TABS: 10.0000 mg | ORAL_TABLET | Freq: Every day | ORAL | 1 refills | Status: AC

## 2021-05-13 NOTE — Telephone Encounter (Signed)
This question was answered in the previous message

## 2021-07-16 ENCOUNTER — Telehealth: Payer: Self-pay | Admitting: Interventional Cardiology

## 2021-07-16 DIAGNOSIS — R002 Palpitations: Secondary | ICD-10-CM

## 2021-07-16 NOTE — Telephone Encounter (Signed)
For the last 2-3 days, pt has been having intermittent palps a few times a day.  Episodes last 5-10 minutes and resolve with deep breathing and relaxation.  Denies CP or dizziness.  Gets a little SOB when bending over.  Does not feel anxious prior to the episodes.  Does admit that he has not been sleeping well the last few nights.  Hasn't checked vitals when this is occurring.  Currently on Metoprolol Succinate 25mg  QD.  Mentioned a monitor if needed and pt agreeable.  Advised I will send message to Dr. for review and advisement.

## 2021-07-16 NOTE — Telephone Encounter (Signed)
Patient c/o Palpitations:  High priority if patient c/o lightheadedness, shortness of breath, or chest pain  How long have you had palpitations/irregular HR/ Afib? Are you having the symptoms now? 2-3 days, not now  Are you currently experiencing lightheadedness, SOB or CP? no  Do you have a history of afib (atrial fibrillation) or irregular heart rhythm? no  Have you checked your BP or HR? (document readings if available): no   Are you experiencing any other symptoms? No   Patient states he has felt fluttering in his chest for the past 2-3 days and is not sure if it is anxiety. He says he has no other symptoms.

## 2021-07-17 NOTE — Telephone Encounter (Signed)
Spoke with Dr. Katrinka Blazing and he would like for pt to wear a 30 day monitor for palps.  Spoke with pt and made him aware.  Pt agreeable to plan and appreciative for the call.

## 2021-08-02 ENCOUNTER — Ambulatory Visit (INDEPENDENT_AMBULATORY_CARE_PROVIDER_SITE_OTHER): Payer: BC Managed Care – PPO

## 2021-08-02 DIAGNOSIS — R002 Palpitations: Secondary | ICD-10-CM

## 2021-08-19 ENCOUNTER — Other Ambulatory Visit: Payer: Self-pay | Admitting: *Deleted

## 2021-08-19 DIAGNOSIS — I7121 Aneurysm of the ascending aorta, without rupture: Secondary | ICD-10-CM

## 2021-09-27 NOTE — Progress Notes (Signed)
Cardiology Office Note:    Date:  09/28/2021   [name], DOB September 30, 1966, MRN [id number]  PCP:  Asencion Gowda.August Saucer, MD  Cardiologist:  Lesleigh Noe, MD   Referring MD: Asencion Gowda.August Saucer, MD   Chief Complaint  Patient presents with   Thoracic Aortic Aneurysm   Hypertension    History of Present Illness:    Bryan Riggs is a 55 y.o. male with a hx of essential hypertension, sinus of Valsalva aneurysm, recurring chest discomfort, and hyperlipidemia.  Failed to get CTA scheduled for 09/19/2021.  He was contacted today about rescheduling for December.  He will see Dr. Clovis Riley later this year or early next year and likely have blood work done.  He is followed by nephrology.  Cannot remember the provider's name.  Kidney function has been stable.  From cardiac standpoint, he has had palpitations.  We recommended a monitor for palpitations in September.  He wore the monitor for a couple weeks but noted that the palpitations began slowing down.  He stopped wearing it but still has it at home.Marland Kitchen  He had rare PVCs.  No sustained arrhythmia was noted.  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 NESI   History of kidney stones    Hypertension    Kidney stone     Past Surgical History:  Procedure Laterality Date   IR FLUORO GUIDE CV LINE RIGHT  06/23/2018   IR REMOVAL TUN CV CATH W/O FL  06/30/2018   IR US GUIDE VASC ACCESS RIGHT  06/23/2018   KNEE ARTHROPLASTY     NO PAST SURGERIES     ROTATOR CUFF REPAIR Right     Current Medications: Current Meds  Medication Sig   amLODipine (NORVASC) 10 MG tablet Take 1 tablet (10 mg total) by mouth daily. Please keep  upcoming appt with Dr. Katrinka Blazing in November 2022 before anymore refills. Thank you Final attempt   lisinopril-hydrochlorothiazide (ZESTORETIC) 20-25 MG tablet Take 1 tablet by mouth daily.   metoprolol succinate (TOPROL-XL) 50 MG 24 hr tablet Take 0.5 tablets (25 mg total) by mouth daily. Take with or immediately following a meal.   tadalafil (CIALIS) 20 MG tablet Take 10-20 mg by mouth daily as needed.    [DISCONTINUED] lisinopril (ZESTRIL) 20 MG tablet Take 1 tablet (20 mg total) by mouth daily.     Allergies:   Sulfasalazine, Sulfa antibiotics, and Viagra [sildenafil citrate]   Social History   Socioeconomic History   Marital status: Divorced    Spouse name: Not on file   Number of children: Not on file   Years of education: Not on file   Highest education level: Not on file  Occupational History   Not on file  Tobacco Use   Smoking status: Never   Smokeless tobacco: Never  Vaping Use   Vaping Use: Never used  Substance and Sexual Activity   Alcohol use: Yes    Comment: RARE   Drug use: No   Sexual activity: Not on file  Other Topics Concern   Not on file  Social History Narrative   Not on file  Social Determinants of Health   Financial Resource Strain: Not on file  Food Insecurity: Not on file  Transportation Needs: Not on file  Physical Activity: Not on file  Stress: Not on file  Social Connections: Not on file     Family History: The patient's family history includes Cancer in his brother, father, and mother; Hypertension in his father.  ROS:   Please see the history of present illness.    He is not sleeping well.  He denies chest pain.  Palpitations have significantly improved.  His CTA of the chest has been rescheduled.  All other systems reviewed and are negative.  EKGs/Labs/Other Studies Reviewed:    The following studies were reviewed today: AORTIC CTA 12/18/2019: IMPRESSION: 1. No acute finding 2. Dilated aortic root with 46 mm diameter sinuses of  Valsalva. 3. Left brachiocephalic vein stenosis   CARDIAC MONITOR 09/04/2021: Study Highlights  NSR with 1 degree AVB, with ave HR 71.5 bpm No atrial fibrillation Rare PVC's  EKG:  EKG normal sinus rhythm, left atrial abnormality, poor R wave progression V1 through V3.  Nonspecific T wave flattening.  Recent Labs: No results found for requested labs within last 8760 hours.  Recent Lipid Panel    Component Value Date/Time   CHOL 155 12/19/2019 0701   TRIG 89 12/19/2019 0701   HDL 34 (L) 12/19/2019 0701   CHOLHDL 4.6 12/19/2019 0701   VLDL 18 12/19/2019 0701   LDLCALC 103 (H) 12/19/2019 0701    Physical Exam:    VS:  BP 122/84   Pulse 66   Ht 6' (1.829 m)   Wt 260 lb (117.9 kg)   SpO2 94%   BMI 35.26 kg/m     Wt Readings from Last 3 Encounters:  09/28/21 260 lb (117.9 kg)  02/11/20 251 lb (113.9 kg)  10/03/19 248 lb (112.5 kg)     GEN: Overweight with BMI of 35. No acute distress HEENT: Normal NECK: No JVD. LYMPHATICS: No lymphadenopathy CARDIAC: No murmur. RRR no gallop, or edema. VASCULAR:  Normal Pulses. No bruits. RESPIRATORY:  Clear to auscultation without rales, wheezing or rhonchi  ABDOMEN: Soft, non-tender, non-distended, No pulsatile mass, MUSCULOSKELETAL: No deformity  SKIN: Warm and dry NEUROLOGIC:  Alert and oriented x 3 PSYCHIATRIC:  Normal affect   ASSESSMENT:    1. 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 Essential hypertension   3. Palpitations   4. Erectile dysfunction due to arterial insufficiency    PLAN:    In order of problems listed above:  Being comanaged with thoracic surgery.  He is on adequate blood pressure therapy that includes lisinopril HCTZ 20/12.5 mg/day and Toprol-XL 25 mg/day.  He is also on Norvasc 10 mg/day.  He tells me that Dr. Sharee Pimple office called today and his imaging study has been rescheduled for early December. Low-salt diet, weight loss, and aerobic activity encouraged Monitor revealed  occasional PVCs.  No atrial fibrillation or sustained tacky arrhythmias. We discussed the potential impact of hyperlipidemia on erectile function.  His most recent LDL in 2021 was 103.  Perhaps we should consider low-dose statin therapy.  Overall education and awareness concerning secondary risk prevention was discussed in detail: LDL less than 70, hemoglobin A1c less than 7, blood pressure target less than 130/80 mmHg, >150 minutes of moderate aerobic activity per week, avoidance of smoking, weight control (via diet and exercise), and continued surveillance/management of/for obstructive sleep apnea.      Medication Adjustments/Labs and Tests Ordered:  Current medicines are reviewed at length with the patient today.  Concerns regarding medicines are outlined above.  Orders Placed This Encounter  Procedures   EKG 12-Lead   No orders of the defined types were placed in this encounter.   Patient Instructions  Medication Instructions:  Your physician recommends that you continue on your current medications as directed. Please refer to the Current Medication list given to you today.  *If you need a refill on your cardiac medications before your next appointment, please call your pharmacy*   Lab Work: None  If you have labs (blood work) drawn today and your tests are completely normal, you will receive your results only by: MyChart Message (if you have MyChart) OR A paper copy in the mail If you have any lab test that is abnormal or we need to change your treatment, we will call you to review the results.   Testing/Procedures: None   Follow-Up: At [facility], you and your health needs are our priority.  As part of our continuing mission to provide you with exceptional heart care, we have created designated Provider Care Teams.  These Care Teams include your primary Cardiologist (physician) and Advanced Practice Providers (APPs -  Physician Assistants and Nurse Practitioners) who  all work together to provide you with the care you need, when you need it.  We recommend signing up for the patient portal called "MyChart".  Sign up information is provided on this After Visit Summary.  MyChart is used to connect with patients for Virtual Visits (Telemedicine).  Patients are able to view lab/test results, encounter notes, upcoming appointments, etc.  Non-urgent messages can be sent to your provider as well.   To learn more about what you can do with MyChart, go to [email or web address].    Your next appointment:   9-12 month(s)  The format for your next appointment:   In Person  Provider:   Lesleigh Noe, MD          Signed, Lesleigh Noe, MD  09/28/2021 5:09 PM    [facility]

## 2021-09-28 ENCOUNTER — Ambulatory Visit: Payer: BC Managed Care – PPO | Admitting: Interventional Cardiology

## 2021-09-28 ENCOUNTER — Encounter: Payer: Self-pay | Admitting: Interventional Cardiology

## 2021-09-28 ENCOUNTER — Other Ambulatory Visit: Payer: Self-pay

## 2021-09-28 VITALS — BP 122/84 | HR 66 | Ht 72.0 in | Wt 260.0 lb

## 2021-09-28 DIAGNOSIS — R002 Palpitations: Secondary | ICD-10-CM | POA: Diagnosis not present

## 2021-09-28 DIAGNOSIS — N5201 Erectile dysfunction due to arterial insufficiency: Secondary | ICD-10-CM

## 2021-09-28 DIAGNOSIS — Q2543 Congenital aneurysm of aorta: Secondary | ICD-10-CM | POA: Diagnosis not present

## 2021-09-28 DIAGNOSIS — I10 Essential (primary) hypertension: Secondary | ICD-10-CM

## 2021-09-28 NOTE — Patient Instructions (Signed)
Medication Instructions:  Your physician recommends that you continue on your current medications as directed. Please refer to the Current Medication list given to you today.  *If you need a refill on your cardiac medications before your next appointment, please call your pharmacy*   Lab Work: None  If you have labs (blood work) drawn today and your tests are completely normal, you will receive your results only by: MyChart Message (if you have MyChart) OR A paper copy in the mail If you have any lab test that is abnormal or we need to change your treatment, we will call you to review the results.   Testing/Procedures: None   Follow-Up: At [facility], you and your health needs are our priority.  As part of our continuing mission to provide you with exceptional heart care, we have created designated Provider Care Teams.  These Care Teams include your primary Cardiologist (physician) and Advanced Practice Providers (APPs -  Physician Assistants and Nurse Practitioners) who all work together to provide you with the care you need, when you need it.  We recommend signing up for the patient portal called "MyChart".  Sign up information is provided on this After Visit Summary.  MyChart is used to connect with patients for Virtual Visits (Telemedicine).  Patients are able to view lab/test results, encounter notes, upcoming appointments, etc.  Non-urgent messages can be sent to your provider as well.   To learn more about what you can do with MyChart, go to [email or web address].    Your next appointment:   9-12 month(s)  The format for your next appointment:   In Person  Provider:   Lesleigh Noe, MD

## 2021-10-07 ENCOUNTER — Ambulatory Visit
Admission: RE | Admit: 2021-10-07 | Discharge: 2021-10-07 | Disposition: A | Discharge status: Home / Self Care | Payer: BC Managed Care – PPO | Source: Ambulatory Visit | Attending: Surgery | Admitting: Surgery

## 2021-10-07 ENCOUNTER — Encounter: Payer: BC Managed Care – PPO | Admitting: Surgery

## 2021-10-07 DIAGNOSIS — I7121 Aneurysm of the ascending aorta, without rupture: Secondary | ICD-10-CM

## 2021-10-07 DIAGNOSIS — I712 Thoracic aortic aneurysm, without rupture, unspecified: Secondary | ICD-10-CM | POA: Diagnosis not present

## 2021-10-07 MED ORDER — IOPAMIDOL (ISOVUE-370) INJECTION 76%
50.0000 mL | Freq: Once | INTRAVENOUS | Status: AC | PRN
  Administered 2021-10-07: 50 mL via INTRAVENOUS

## 2021-10-14 ENCOUNTER — Other Ambulatory Visit: Payer: Self-pay

## 2021-10-14 ENCOUNTER — Ambulatory Visit: Payer: BC Managed Care – PPO | Admitting: Surgery

## 2021-10-14 ENCOUNTER — Encounter: Payer: Self-pay | Admitting: Surgery

## 2021-10-14 VITALS — BP 135/85 | HR 70 | Resp 20 | Ht 72.0 in | Wt 262.0 lb

## 2021-10-14 DIAGNOSIS — I7121 Aneurysm of the ascending aorta, without rupture: Secondary | ICD-10-CM

## 2021-10-14 NOTE — Progress Notes (Signed)
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  I last saw him in November 2020.  He has continued to feel well without any change to his medical history.  He denies any chest or back pain.  Current Outpatient Medications  Medication Sig Dispense Refill   amLODipine (NORVASC) 10 MG tablet Take 1 tablet (10 mg total) by mouth daily. Please keep upcoming appt with Dr. Katrinka Blazing in November 2022 before anymore refills. Thank you Final attempt 90 tablet 1   lisinopril-hydrochlorothiazide (ZESTORETIC) 20-25 MG tablet Take 1 tablet by mouth daily.     metoprolol succinate (TOPROL-XL) 50 MG 24 hr tablet Take 0.5 tablets (25 mg total) by mouth daily. Take with or immediately following a meal. 45 tablet 1   tadalafil (CIALIS) 20 MG tablet Take 10-20 mg by mouth daily as needed.      No current facility-administered medications for this visit.     Physical Exam: BP 135/85 (BP Location: Left Arm, Patient Position: Sitting)   Pulse 70   Resp 20   Ht 6' (1.829 m)   Wt 262 lb (118.8 kg)   SpO2 92% Comment: RA  BMI 35.53 kg/m  He looks well. Cardiac exam shows regular rate and rhythm with normal heart sounds.  There is no murmur. Lungs are clear.   Diagnostic Tests:  Narrative & Impression  CLINICAL DATA:  Follow-up of aneurysmal dilatation of the thoracic aorta.   EXAM: CT ANGIOGRAPHY CHEST WITH CONTRAST   TECHNIQUE: Multidetector CT imaging of the chest was performed using the standard protocol during bolus administration of intravenous contrast. Multiplanar CT image reconstructions and MIPs were obtained to evaluate the vascular anatomy.   CONTRAST:  80mL ISOVUE-370 IOPAMIDOL (ISOVUE-370) INJECTION 76%   COMPARISON:  10/03/2019, 12/18/2019 and additional prior studies.   FINDINGS: Cardiovascular: Measurement aortic root diameter at the level of the sinuses of Valsalva is variable due to cusp asymmetry but  ranges from approximately 4.3-4.7 cm and is felt to be stable dating back to a study on 07/20/2013. The ascending thoracic aorta remains of normal caliber and measures approximately 3.8 cm in maximum diameter. The aortic arch measures 3.5-2.8 cm. The descending thoracic aorta measures 2.5 cm. There is no evidence of aortic dissection. 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   The heart size is within normal limits. No pericardial fluid identified. No significant calcified coronary artery plaque is visualized. Central pulmonary arteries are of normal caliber.   Mediastinum/Nodes: No enlarged mediastinal, hilar, or axillary lymph nodes. Thyroid gland, trachea, and esophagus demonstrate no significant findings.   Lungs/Pleura: There is no evidence of pulmonary edema, consolidation, pneumothorax, nodule or pleural fluid.   Upper Abdomen: No acute abnormality.   Musculoskeletal: No chest wall abnormality. No acute or significant osseous findings.   Review of the MIP images confirms the above findings.   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 This is stable since 2014.   Aortic aneurysm NOS (ICD10-I71.9).     Electronically Signed   By: Irish Lack M.D.   On: 10/07/2021 09:09      Impression: He has a stable 4.7 cm aortic root aneurysm which is unchanged dating back to 2014.  This is well below the surgical  threshold of 5.5 cm in a patient with a normal trileaflet aortic valve.  I reviewed the CTA images with him and answered his questions.  I stressed the importance of continued good blood pressure control in preventing further enlargement and acute aortic dissection.  I advised him against doing any heavy lifting that may require a Valsalva  maneuver.  I also advised him against taking quinolone antibiotics that have been associated with enlargement of aortic aneurysms.   Plan:  He will return to see me in 2 years with a CTA of the chest/aorta with gating.  I spent 20 minutes performing this established patient evaluation and > 50% of this time was spent face to face counseling and coordinating the care of this patient's aortic aneurysm.    Alleen Borne, MD [facility] [phone]

## 2021-10-28 DIAGNOSIS — Z0000 Encounter for general adult medical examination without abnormal findings: Secondary | ICD-10-CM | POA: Diagnosis not present

## 2021-10-28 DIAGNOSIS — Z23 Encounter for immunization: Secondary | ICD-10-CM | POA: Diagnosis not present

## 2021-10-28 DIAGNOSIS — E7800 Pure hypercholesterolemia, unspecified: Secondary | ICD-10-CM | POA: Diagnosis not present

## 2021-11-18 DIAGNOSIS — N183 Chronic kidney disease, stage 3 unspecified: Secondary | ICD-10-CM | POA: Diagnosis not present

## 2021-11-18 DIAGNOSIS — I10 Essential (primary) hypertension: Secondary | ICD-10-CM | POA: Diagnosis not present

## 2022-01-05 DIAGNOSIS — N3943 Post-void dribbling: Secondary | ICD-10-CM | POA: Diagnosis not present

## 2022-01-05 DIAGNOSIS — R351 Nocturia: Secondary | ICD-10-CM | POA: Diagnosis not present

## 2022-01-05 DIAGNOSIS — N401 Enlarged prostate with lower urinary tract symptoms: Secondary | ICD-10-CM | POA: Diagnosis not present

## 2022-01-05 DIAGNOSIS — R972 Elevated prostate specific antigen [PSA]: Secondary | ICD-10-CM | POA: Diagnosis not present

## 2022-04-28 DIAGNOSIS — N529 Male erectile dysfunction, unspecified: Secondary | ICD-10-CM | POA: Diagnosis not present

## 2022-04-28 DIAGNOSIS — I10 Essential (primary) hypertension: Secondary | ICD-10-CM | POA: Diagnosis not present

## 2022-04-28 DIAGNOSIS — R972 Elevated prostate specific antigen [PSA]: Secondary | ICD-10-CM | POA: Diagnosis not present

## 2022-04-28 DIAGNOSIS — E7800 Pure hypercholesterolemia, unspecified: Secondary | ICD-10-CM | POA: Diagnosis not present

## 2022-07-17 ENCOUNTER — Other Ambulatory Visit: Payer: Self-pay | Admitting: Interventional Cardiology

## 2022-10-20 NOTE — Progress Notes (Unsigned)
Office Visit    Patient Name: Bryan Riggs Date of Encounter: 10/21/2022  Primary Care Provider:  Clovis Riley, L.August Saucer, MD Primary Cardiologist:  Lesleigh Noe, MD Primary Electrophysiologist: None  Chief Complaint    Bryan Riggs is a 56 y.o. male with PMH of sinus of Valsalva aortic root aneurysm, HTN, HLD who presents today for annual follow-up.  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 NESI   History of kidney stones    Hypertension    Kidney stone    Past Surgical History:  Procedure Laterality Date   IR FLUORO GUIDE CV LINE RIGHT  06/23/2018   IR REMOVAL TUN CV CATH W/O FL  06/30/2018   IR US GUIDE VASC ACCESS RIGHT  06/23/2018   KNEE ARTHROPLASTY     NO PAST SURGERIES     ROTATOR CUFF REPAIR Right     Allergies  Allergies  Allergen Reactions   Sulfasalazine Nausea And Vomiting   Sulfa Antibiotics Rash and Nausea And Vomiting    Break out, itching   Viagra [Sildenafil Citrate] Other (See Comments)    FLUSHING     History of Present Illness    Bryan Riggs  is a 56 year old male with the above mention past medical history who presents today for annual follow-up of hypertension and medication management.  Bryan Riggs was initially seen by Dr. Katrinka Blazing in 2015 after experiencing chest discomfort and elevated blood pressure.  He was previously seen by Dr. Laneta Simmers for thoracic aortic aneurysm that was seen via chest CTA and 07/2013.  Aneurysm showed dilation of sinus of valsalva diameter of 48 mm. 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 He had an echo on 08/10/2013 that showed mild  dilatation of the aortic sinuses to 46 mm with normal tricuspid aortic valve function and normal LV. An echo in 2012 reportedly showed a sinus diameter of 46 mm.  He was seen in 12/2019 for complaint of chest pain in the ED.  EKG shows sinus rhythm with first-degree AV block and LVH with Q waves and lead Riggs, aVF, V2 and VRecently saw Dr. 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 BP in ER 185/131 to 167/103.  He underwent Lexiscan Myoview that showed a mild reduced EF of 45-50% and low restudy.  Blood pressure medications were adjusted and hydralazine was added with HCTZ transition to chlorthalidone.  In addition his Toprol was switched to carvedilol for better GDMT.  During follow-up visit patient had excellent blood pressure control and no changes were made to medication regimen.  He was last seen by Dr. Katrinka Blazing 09/2021 and aneurysm was stable measuring at 4.7 cm.  From a cardiac perspective he endorsed palpitations and wore an event monitor that showed occasional PVCs but no arrhythmia or concerns.  Patient's blood pressure remained adequately controlled and consideration was made for low-dose statin due to LDL of 103.  Bryan Riggs presents today for annual follow-up for coronary artery disease.  Since  last being seen in the office patient reports he has been doing well from cardiac perspective and has no new complaints today.  His blood pressure today was initially elevated at 120/96 and was 126/84 on recheck.  Heart rate was 74 bpm and he reports no sustained palpitations since his previous visit.  He did note last week 2 episodes of palpitations that improved with rest and drinking water.  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  He endorses a poor diet currently and eats fast food mainly.  He was  encouraged to increase his consumption of whole grains and low-fat meats and dairy.  He is scheduled to see Dr. Laneta Simmers next month for follow-up.  Patient denies chest pain, palpitations, dyspnea, PND, orthopnea, nausea, vomiting, dizziness, syncope, edema, weight gain, or early satiety.   Home Medications    Current Outpatient Medications  Medication Sig Dispense Refill   amLODipine (NORVASC) 10 MG tablet Take 1 tablet (10 mg total) by mouth daily. Please keep upcoming appt with Dr. Katrinka Blazing in November 2022 before anymore refills. Thank you Final attempt 90 tablet 1   lisinopril-hydrochlorothiazide (ZESTORETIC) 20-25 MG tablet Take 1 tablet by mouth daily.     metoprolol succinate (TOPROL-XL) 50 MG 24 hr tablet TAKE 1/2 TABLET(25 MG) BY MOUTH DAILY WITH OR IMMEDIATELY FOLLOWING A MEAL 45 tablet 0   tadalafil (CIALIS) 20 MG tablet Take 10-20 mg by mouth daily as needed.      No current facility-administered medications for this visit.     Review of Systems  Please see the history of present illness.    (+) Deconditioning (+) Palpitations  All other systems reviewed and are otherwise negative except as noted above.  Physical Exam    Wt Readings from Last 3 Encounters:  10/21/22 267 lb (121.1 kg)  10/14/21 262 lb (118.8 kg)  09/28/21 260 lb (117.9 kg)   VS: Vitals:   10/21/22 0854 10/21/22 1142  BP: (!) 120/96 126/84  Pulse: 74   SpO2: 94%   ,Body mass index is 36.21 kg/m.  Constitutional:      Appearance: Healthy appearance. Not in distress.  Neck:     Vascular: JVD normal.  Pulmonary:     Effort: Pulmonary effort is normal.     Breath sounds: No wheezing. No rales. Diminished in the bases Cardiovascular:     Normal rate. Regular rhythm. Normal S1. Normal S2.      Murmurs: There is no murmur.  Edema:    Peripheral edema absent.  Abdominal:     Palpations: Abdomen is soft non tender. There is no hepatomegaly.  Skin:    General: Skin is warm and dry.  Neurological:      General: No focal deficit present.     Mental Status: Alert and oriented to person, place and time.     Cranial Nerves: Cranial nerves are intact.  EKG/LABS/Other Studies Reviewed    ECG personally reviewed by me today -sinus rhythm with first-degree AVB and rate of 74 bpm with no acute changes consistent with previous EKG.  Lab Results  Component Value Date   WBC 2.8 (L) 12/19/2019   HGB 17.7 (H) 12/19/2019   HCT 52.2 (H) 12/19/2019   MCV 88.0 12/19/2019   PLT 185 12/19/2019   Lab Results  Component Value Date   CREATININE 1.52 (H) 12/19/2019   BUN  17 12/19/2019   NA 140 12/19/2019   K 3.6 12/19/2019   CL 105 12/19/2019   CO2 25 12/19/2019   Lab Results  Component Value Date   ALT 20 06/23/2018   AST 24 06/20/2018   ALKPHOS 76 06/20/2018   BILITOT 1.4 (H) 06/20/2018   Lab Results  Component Value Date   CHOL 155 12/19/2019   HDL 34 (L) 12/19/2019   LDLCALC 103 (H) 12/19/2019   TRIG 89 12/19/2019   CHOLHDL 4.6 12/19/2019    Lab Results  Component Value Date   HGBA1C 5.3 12/18/2019    Assessment & Plan    1.  Essential hypertension: -Patient's blood pressure today was initially elevated at 120/96 and was 126/84 on recheck. -He is currently eating fast foods and is not participating in exercise weekly.  He was encouraged to check blood pressures over the next 2 weeks and if elevated we will increase his Zestoretic to 40-25 mg. -We discussed the importance of maintaining good blood pressure especially in preventing progression of his aneurysm. -Continue Zestoretic 20-25 mg and Toprol-XL 25 mg daily  2.  Aneurysm of aortic sinus of Valsalva: -Most recent CT of the chest completed 09/2021 with stable aneurysm and patient followed up with Dr. Laneta Simmers 09/2021.  He is scheduled to have a follow-up next month for repeat CT.  Aneurysm was shown to be well below surgical threshold with blood pressure control was stressed.  3.  Chest pain: -Today patient reports no chest  pain or recurrence since previous visit. -He was encouraged to increase physical activity to at least 150 minutes/week  4.  CKD stage Riggs: -He is currently followed by [facility] and sees Dr. Marisue Humble -Most recent creatinine was 1.63 on 11/2021   Disposition: Follow-up with Bryan Records III, MD or APP in 12 months    Medication Adjustments/Labs and Tests Ordered: Current medicines are reviewed at length with the patient today.  Concerns regarding medicines are outlined above.   Signed, Napoleon Form, Leodis Rains, NP 10/21/2022, 11:50 AM [facility]  Note:  This document was prepared using Dragon voice recognition software and may include unintentional dictation errors.

## 2022-10-21 ENCOUNTER — Encounter: Payer: Self-pay | Admitting: Nurse Practitioner

## 2022-10-21 ENCOUNTER — Ambulatory Visit: Payer: BC Managed Care – PPO | Attending: Nurse Practitioner | Admitting: Nurse Practitioner

## 2022-10-21 VITALS — BP 126/84 | HR 74 | Ht 72.0 in | Wt 267.0 lb

## 2022-10-21 DIAGNOSIS — R079 Chest pain, unspecified: Secondary | ICD-10-CM | POA: Diagnosis not present

## 2022-10-21 DIAGNOSIS — I10 Essential (primary) hypertension: Secondary | ICD-10-CM | POA: Diagnosis not present

## 2022-10-21 DIAGNOSIS — I130 Hypertensive heart and chronic kidney disease with heart failure and stage 1 through stage 4 chronic kidney disease, or unspecified chronic kidney disease: Secondary | ICD-10-CM | POA: Diagnosis not present

## 2022-10-21 DIAGNOSIS — Q2543 Congenital aneurysm of aorta: Secondary | ICD-10-CM | POA: Diagnosis not present

## 2022-10-21 NOTE — Patient Instructions (Signed)
Medication Instructions:  Your physician recommends that you continue on your current medications as directed. Please refer to the Current Medication list given to you today. *If you need a refill on your cardiac medications before your next appointment, please call your pharmacy*   Lab Work: None ordered   Testing/Procedures: None ordered   Follow-Up: At [facility], you and your health needs are our priority.  As part of our continuing mission to provide you with exceptional heart care, we have created designated Provider Care Teams.  These Care Teams include your primary Cardiologist (physician) and Advanced Practice Providers (APPs -  Physician Assistants and Nurse Practitioners) who all work together to provide you with the care you need, when you need it.  We recommend signing up for the patient portal called "MyChart".  Sign up information is provided on this After Visit Summary.  MyChart is used to connect with patients for Virtual Visits (Telemedicine).  Patients are able to view lab/test results, encounter notes, upcoming appointments, etc.  Non-urgent messages can be sent to your provider as well.   To learn more about what you can do with MyChart, go to [email or web address].    Your next appointment:   12 month(s)  The format for your next appointment:   In Person  Provider:   TO BE DETERMINED    Other Instructions   Important Information About Sugar

## 2022-10-26 ENCOUNTER — Other Ambulatory Visit: Payer: Self-pay

## 2022-10-26 MED ORDER — METOPROLOL SUCCINATE ER 50 MG PO TB24: ORAL_TABLET | ORAL | 3 refills | Status: DC

## 2022-12-06 DIAGNOSIS — N183 Chronic kidney disease, stage 3 unspecified: Secondary | ICD-10-CM | POA: Diagnosis not present

## 2022-12-16 DIAGNOSIS — I129 Hypertensive chronic kidney disease with stage 1 through stage 4 chronic kidney disease, or unspecified chronic kidney disease: Secondary | ICD-10-CM | POA: Diagnosis not present

## 2022-12-16 DIAGNOSIS — N183 Chronic kidney disease, stage 3 unspecified: Secondary | ICD-10-CM | POA: Diagnosis not present

## 2022-12-27 DIAGNOSIS — R3912 Poor urinary stream: Secondary | ICD-10-CM | POA: Diagnosis not present

## 2022-12-27 DIAGNOSIS — N401 Enlarged prostate with lower urinary tract symptoms: Secondary | ICD-10-CM | POA: Diagnosis not present

## 2023-01-03 DIAGNOSIS — R3912 Poor urinary stream: Secondary | ICD-10-CM | POA: Diagnosis not present

## 2023-01-03 DIAGNOSIS — R350 Frequency of micturition: Secondary | ICD-10-CM | POA: Diagnosis not present

## 2023-01-03 DIAGNOSIS — N401 Enlarged prostate with lower urinary tract symptoms: Secondary | ICD-10-CM | POA: Diagnosis not present

## 2023-01-03 DIAGNOSIS — N4232 Atypical small acinar proliferation of prostate: Secondary | ICD-10-CM | POA: Diagnosis not present

## 2023-02-18 DIAGNOSIS — N401 Enlarged prostate with lower urinary tract symptoms: Secondary | ICD-10-CM | POA: Diagnosis not present

## 2023-02-18 DIAGNOSIS — R6882 Decreased libido: Secondary | ICD-10-CM | POA: Diagnosis not present

## 2023-02-18 DIAGNOSIS — R350 Frequency of micturition: Secondary | ICD-10-CM | POA: Diagnosis not present

## 2023-03-10 DIAGNOSIS — M25362 Other instability, left knee: Secondary | ICD-10-CM | POA: Diagnosis not present

## 2023-03-24 ENCOUNTER — Ambulatory Visit (HOSPITAL_COMMUNITY)
Admission: EM | Admit: 2023-03-24 | Discharge: 2023-03-24 | Disposition: A | Discharge status: Home / Self Care | Payer: BC Managed Care – PPO | Attending: Family Medicine | Admitting: Family Medicine

## 2023-03-24 ENCOUNTER — Encounter (HOSPITAL_COMMUNITY): Payer: Self-pay | Admitting: *Deleted

## 2023-03-24 ENCOUNTER — Other Ambulatory Visit: Payer: Self-pay

## 2023-03-24 DIAGNOSIS — H01001 Unspecified blepharitis right upper eyelid: Secondary | ICD-10-CM

## 2023-03-24 MED ORDER — BACITRACIN-POLYMYXIN B 500-10000 UNIT/GM OP OINT: 1.0000 | TOPICAL_OINTMENT | Freq: Two times a day (BID) | OPHTHALMIC | 0 refills | Status: DC

## 2023-03-24 NOTE — ED Provider Notes (Signed)
[facility]    CSN: [id number] Arrival date & time: 03/24/23  4098      History   Chief Complaint Chief Complaint  Patient presents with   Eye Problem    HPI Bryan Riggs is a 57 y.o. male.   Patient is here for right eyelid swelling.  He noted several days ago the eyelid was tender, slightly swollen. Woke up today it is worse.  Using a warm compress with help.   The eye is itchy a bit.  No blurry vision.        Past Medical History:  Diagnosis Date   Aberrant subclavian artery    RIGHT...PER CT CHEST...07/20/13   AKI (acute kidney injury) (HCC) 06/2018   Aneurysm of aortic sinus of Valsalva without rupture    per CT ANGIO CHEST 07/20/13.Marland Kitchen..48mm   Chest pain CT ANGIO CHEST 07/20/13   LED TO DISCOVERY OF SINUS OF VALSALVA  ANEURYSM   Erectile dysfunction    DR. 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 Authorizing Provider  amLODipine (NORVASC) 10 MG tablet Take 1 tablet (10 mg total) by mouth daily. Please keep upcoming appt with Dr. Katrinka Blazing in November 2022 before anymore refills. Thank you Final attempt 05/13/21   Lyn Records, MD  lisinopril-hydrochlorothiazide (ZESTORETIC)  20-25 MG tablet Take 1 tablet by mouth daily. 07/29/21   [provider]  metoprolol succinate (TOPROL-XL) 50 MG 24 hr tablet TAKE 1/2 TABLET(25 MG) BY MOUTH DAILY WITH OR IMMEDIATELY FOLLOWING A MEAL 10/26/22   Lyn Records, MD  tadalafil (CIALIS) 20 MG tablet Take 10-20 mg by mouth daily as needed.  10/26/19   [provider]    Family History Family History  Problem Relation Age of Onset   Cancer Mother        BREAST CANCER   Cancer Father        MUTIPLE MYELOMA   Hypertension Father    Cancer Brother        ? TYPE    Social History Social History   Tobacco Use   Smoking status: Never   Smokeless tobacco: Never  Vaping Use   Vaping Use: Never used  Substance Use Topics   Alcohol use: Yes    Comment: RARE   Drug use: No     Allergies   Sulfasalazine, Sulfa antibiotics, and Viagra [sildenafil citrate]   Review of Systems Review of Systems  Constitutional: Negative.   HENT: Negative.  Respiratory: Negative.    Cardiovascular: Negative.   Gastrointestinal: Negative.   Musculoskeletal: Negative.      Physical Exam Triage Vital Signs ED Triage Vitals  Enc Vitals Group     BP 03/24/23 0840 124/87     Pulse Rate 03/24/23 0840 71     Resp 03/24/23 0840 18     Temp 03/24/23 0840 98.2 F (36.8 C)     Temp src --      SpO2 03/24/23 0840 92 %     Weight --      Height --      Head Circumference --      Peak Flow --      Pain Score 03/24/23 0837 6     Pain Loc --      Pain Edu? --      Excl. in GC? --    No data found.  Updated Vital Signs BP 124/87   Pulse 71   Temp 98.2 F (36.8 C)   Resp 18   SpO2 92%   Visual Acuity Right Eye Distance:   Left Eye Distance:   Bilateral Distance:    Right Eye Near:   Left Eye Near:    Bilateral Near:     Physical Exam Constitutional:      Appearance: Normal appearance.  Eyes:     Comments: The right upper eyelid is red, swollen, tender;  the conjuctiva is injected;  slight  irritation to the lateral sclera;  no drainage noted.   Cardiovascular:     Rate and Rhythm: Normal rate.  Pulmonary:     Effort: Pulmonary effort is normal.  Neurological:     General: No focal deficit present.     Mental Status: He is alert.  Psychiatric:        Mood and Affect: Mood normal.      UC Treatments / Results  Labs (all labs ordered are listed, but only abnormal results are displayed) Labs Reviewed - No data to display  EKG   Radiology No results found.  Procedures Procedures (including critical care time)  Medications Ordered in UC Medications - No data to display  Initial Impression / Assessment and Plan / UC Course  I have reviewed the triage vital signs and the nursing notes.  Pertinent labs & imaging results that were available during my care of the patient were reviewed by me and considered in my medical decision making (see chart for details).  Final Clinical Impressions(s) / UC Diagnoses   Final diagnoses:  Blepharitis of right upper eyelid, unspecified type     Discharge Instructions      You were diagnosed with blepharitis today, an infection of the eyelid.  I have sent out an eye ointment to use twice/day x 5 days. You may continue warm compresses as well.  Please return if not improving.     ED Prescriptions     Medication Sig Dispense Auth. Provider   bacitracin-polymyxin b (POLYSPORIN) ophthalmic ointment Place 1 Application into the right eye every 12 (twelve) hours. apply to eye every 12 hours while awake 3.5 g Jannifer Franklin, MD      PDMP not reviewed this encounter.   Jannifer Franklin, MD 03/24/23 [phone]

## 2023-03-24 NOTE — ED Triage Notes (Signed)
Pt reports he may have had a stye on RT eye maybe since SAt . Pt's upper eye lid is swollen with slight drainage that increased yesterday.

## 2023-03-24 NOTE — Discharge Instructions (Addendum)
You were diagnosed with blepharitis today, an infection of the eyelid.  I have sent out an eye ointment to use twice/day x 5 days. You may continue warm compresses as well.  Please return if not improving.

## 2023-03-25 DIAGNOSIS — H00013 Hordeolum externum right eye, unspecified eyelid: Secondary | ICD-10-CM | POA: Diagnosis not present

## 2023-03-31 ENCOUNTER — Telehealth: Payer: Self-pay

## 2023-03-31 ENCOUNTER — Telehealth: Payer: Self-pay | Admitting: *Deleted

## 2023-03-31 DIAGNOSIS — M25362 Other instability, left knee: Secondary | ICD-10-CM | POA: Diagnosis not present

## 2023-03-31 NOTE — Telephone Encounter (Signed)
Pt has been scheduled for tele pre op appt 04/06/23 @ 9:40. Med rec and consent are done.

## 2023-03-31 NOTE — Telephone Encounter (Signed)
Primary Cardiologist:Henry W Leia Alf, MD (Inactive)   Preoperative team, please contact this patient and set up a phone call appointment for further preoperative risk assessment. Please obtain consent and complete medication review. Thank you for your help.   I confirm that guidance regarding antiplatelet and oral anticoagulation therapy has been completed and, if necessary, noted below (none requested).   Levi Aland, NP-C  03/31/2023, 4:48 PM [address] Office [phone] Fax [phone]

## 2023-03-31 NOTE — Telephone Encounter (Signed)
Pt has been scheduled for tele pre op appt 04/06/23 @ 9:40. Med rec and consent are done.     Patient Consent for Virtual Visit        Bryan Riggs has provided verbal consent on 03/31/2023 for a virtual visit (video or telephone).   CONSENT FOR VIRTUAL VISIT FOR:  [name]  By participating in this virtual visit I agree to the following:  I hereby voluntarily request, consent and authorize [facility] and its employed or contracted physicians, physician assistants, nurse practitioners or other licensed health care professionals (the Practitioner), to provide me with telemedicine health care services (the "Services") as deemed necessary by the treating Practitioner. I acknowledge and consent to receive the Services by the Practitioner via telemedicine. I understand that the telemedicine visit will involve communicating with the Practitioner through live audiovisual communication technology and the disclosure of certain medical information by electronic transmission. I acknowledge that I have been given the opportunity to request an in-person assessment or other available alternative prior to the telemedicine visit and am voluntarily participating in the telemedicine visit.  I understand that I have the right to withhold or withdraw my consent to the use of telemedicine in the course of my care at any time, without affecting my right to future care or treatment, and that the Practitioner or I may terminate the telemedicine visit at any time. I understand that I have the right to inspect all information obtained and/or recorded in the course of the telemedicine visit and may receive copies of available information for a reasonable fee.  I understand that some of the potential risks of receiving the Services via telemedicine include:  Delay or interruption in medical evaluation due to technological equipment failure or disruption; Information transmitted may not be sufficient  (e.g. poor resolution of images) to allow for appropriate medical decision making by the Practitioner; and/or  In rare instances, security protocols could fail, causing a breach of personal health information.  Furthermore, I acknowledge that it is my responsibility to provide information about my medical history, conditions and care that is complete and accurate to the best of my ability. I acknowledge that Practitioner's advice, recommendations, and/or decision may be based on factors not within their control, such as incomplete or inaccurate data provided by me or distortions of diagnostic images or specimens that may result from electronic transmissions. I understand that the practice of medicine is not an exact science and that Practitioner makes no warranties or guarantees regarding treatment outcomes. I acknowledge that a copy of this consent can be made available to me via my patient portal [facility] MyChart), or I can request a printed copy by calling the office of [facility].    I understand that my insurance will be billed for this visit.   I have read or had this consent read to me. I understand the contents of this consent, which adequately explains the benefits and risks of the Services being provided via telemedicine.  I have been provided ample opportunity to ask questions regarding this consent and the Services and have had my questions answered to my satisfaction. I give my informed consent for the services to be provided through the use of telemedicine in my medical care

## 2023-03-31 NOTE — Telephone Encounter (Signed)
   Pre-operative Risk Assessment    Patient Name: BERLE VOLLRATH  DOB: 06-20-1966 MRN: [id number]      Request for Surgical Clearance    Procedure:   Lt Partial Knee Replacement   Date of Surgery:  Clearance TBD                                 Surgeon:  Weber Cooks, MD Surgeon's Group or Practice Name:  [facility] Phone number:  [phone] Fax number:  [phone]   Type of Clearance Requested:   - Medical    Type of Anesthesia:  Spinal   Additional requests/questions:   N/A  SignedStevan Born   03/31/2023, 3:09 PM

## 2023-04-06 ENCOUNTER — Ambulatory Visit: Payer: BC Managed Care – PPO | Attending: Cardiovascular Disease | Admitting: Nurse Practitioner

## 2023-04-06 DIAGNOSIS — Z01810 Encounter for preprocedural cardiovascular examination: Secondary | ICD-10-CM

## 2023-04-06 NOTE — Progress Notes (Signed)
Virtual Visit via Telephone Note   Because of Bryan Riggs's co-morbid illnesses, he is at least at moderate risk for complications without adequate follow up.  This format is felt to be most appropriate for this patient at this time.  The patient did not have access to video technology/had technical difficulties with video requiring transitioning to audio format only (telephone).  All issues noted in this document were discussed and addressed.  No physical exam could be performed with this format.  Please refer to the patient's chart for his consent to telehealth for [facility].  Evaluation Performed:  Preoperative cardiovascular risk assessment _____________   Date:  04/06/2023   Patient [name], DOB 10-26-1966, MRN [id number] Patient Location:  Home Provider location:   Office  Primary Care Provider:  Clovis Riley, L.August Saucer, MD Primary Cardiologist:  Lesleigh Noe, MD (Inactive)  Chief Complaint / Patient Profile   57 y.o. y/o male with a h/o sinus of Valsalva aortic root aneurysm, hypertension, hyperlipidemia, and CKD stage III who is pending left partial knee replacement with Dr. Weber Cooks of [facility] and presents today for telephonic preoperative cardiovascular risk assessment.  History of Present Illness    Bryan Riggs is a 57 y.o. male who presents via audio/video conferencing for a telehealth visit today.  Pt was last seen in cardiology clinic on 10/21/2022 by Robin Searing, PA.  At that time Bryan Riggs was doing well.  The patient is now pending procedure as outlined above. Since his last visit, he has done well from a cardiac standpoint.  His aortic root aneurysm has been monitored by CT surgery.  He is due for routine repeat CTA chest/aorta in 09/2023 per Dr. Laneta Simmers.  He denies chest pain, palpitations, dyspnea, pnd, orthopnea, n, v, dizziness, syncope, edema, weight gain, or early satiety. All other systems  reviewed and are otherwise negative except as noted above.   Past Medical History    Past Medical History:  Diagnosis Date   Aberrant subclavian artery    RIGHT...PER CT CHEST...07/20/13   AKI (acute kidney injury) (HCC) 06/2018   Aneurysm of aortic sinus of Valsalva without rupture    per CT ANGIO CHEST 07/20/13.Marland Kitchen..48mm   Chest pain CT ANGIO CHEST 07/20/13   LED TO DISCOVERY OF SINUS OF VALSALVA  ANEURYSM   Erectile dysfunction    DR. NESI   History of kidney stones    Hypertension    Kidney stone    Past Surgical History:  Procedure Laterality Date   IR FLUORO GUIDE CV LINE RIGHT  06/23/2018   IR REMOVAL TUN CV CATH W/O FL  06/30/2018   IR US GUIDE VASC ACCESS RIGHT  06/23/2018   KNEE ARTHROPLASTY     NO PAST SURGERIES     ROTATOR CUFF REPAIR Right     Allergies  Allergies  Allergen Reactions   Sulfasalazine Nausea And Vomiting   Sulfa Antibiotics Rash and Nausea And Vomiting    Break out, itching   Viagra [Sildenafil Citrate] Other (See Comments)    FLUSHING     Home Medications    Prior to Admission medications   Medication Sig Start Date End Date Taking? Authorizing Provider  amLODipine (NORVASC) 10 MG tablet Take 1 tablet (10 mg total) by mouth daily. Please keep upcoming appt with Dr. Katrinka Blazing in November 2022 before anymore refills. Thank you Final attempt 05/13/21   Lyn Records, MD  bacitracin-polymyxin b (POLYSPORIN) ophthalmic ointment Place  1 Application into the right eye every 12 (twelve) hours. apply to eye every 12 hours while awake 03/24/23   Piontek, Denny Peon, MD  lisinopril-hydrochlorothiazide (ZESTORETIC) 20-25 MG tablet Take 1 tablet by mouth daily. 07/29/21   [provider]  metoprolol succinate (TOPROL-XL) 50 MG 24 hr tablet TAKE 1/2 TABLET(25 MG) BY MOUTH DAILY WITH OR IMMEDIATELY FOLLOWING A MEAL 10/26/22   Lyn Records, MD  tadalafil (CIALIS) 20 MG tablet Take 10-20 mg by mouth daily as needed.  10/26/19   [provider]    Physical  Exam    Vital Signs:  Bryan Riggs does not have vital signs available for review today.  Given telephonic nature of communication, physical exam is limited. AAOx3. NAD. Normal affect.  Speech and respirations are unlabored.  Accessory Clinical Findings    None  Assessment & Plan    1.  Preoperative Cardiovascular Risk Assessment:  According to the Revised Cardiac Risk Index (RCRI), his Perioperative Risk of Major Cardiac Event is (%): 0.4. His Functional Capacity in METs is: 8.97 according to the Duke Activity Status Index (DASI). Therefore, based on ACC/AHA guidelines, patient would be at acceptable risk for the planned procedure without further cardiovascular testing.  The patient was advised that if he develops new symptoms prior to surgery to contact our office to arrange for a follow-up visit, and he verbalized understanding.  A copy of this note will be routed to requesting surgeon.  Time:   Today, I have spent 5 minutes with the patient with telehealth technology discussing medical history, symptoms, and management plan.     Joylene Grapes, NP  04/06/2023, 9:51 AM

## 2023-05-05 DIAGNOSIS — M1712 Unilateral primary osteoarthritis, left knee: Secondary | ICD-10-CM | POA: Diagnosis not present

## 2023-05-05 DIAGNOSIS — M25362 Other instability, left knee: Secondary | ICD-10-CM | POA: Diagnosis not present

## 2023-05-05 DIAGNOSIS — M25562 Pain in left knee: Secondary | ICD-10-CM | POA: Diagnosis not present

## 2023-05-05 DIAGNOSIS — Z01812 Encounter for preprocedural laboratory examination: Secondary | ICD-10-CM | POA: Diagnosis not present

## 2023-06-08 DIAGNOSIS — Z23 Encounter for immunization: Secondary | ICD-10-CM | POA: Diagnosis not present

## 2023-06-08 DIAGNOSIS — Z0000 Encounter for general adult medical examination without abnormal findings: Secondary | ICD-10-CM | POA: Diagnosis not present

## 2023-06-17 DIAGNOSIS — M1712 Unilateral primary osteoarthritis, left knee: Secondary | ICD-10-CM | POA: Diagnosis not present

## 2023-08-17 DIAGNOSIS — Z23 Encounter for immunization: Secondary | ICD-10-CM | POA: Diagnosis not present

## 2023-08-24 ENCOUNTER — Other Ambulatory Visit: Payer: Self-pay | Admitting: Surgery

## 2023-08-24 DIAGNOSIS — Q2543 Congenital aneurysm of aorta: Secondary | ICD-10-CM

## 2023-08-31 ENCOUNTER — Encounter (HOSPITAL_COMMUNITY): Payer: Self-pay | Admitting: Cardiology

## 2023-08-31 ENCOUNTER — Other Ambulatory Visit: Payer: Self-pay | Admitting: Surgery

## 2023-08-31 DIAGNOSIS — Q2543 Congenital aneurysm of aorta: Secondary | ICD-10-CM

## 2023-09-05 ENCOUNTER — Encounter: Payer: Self-pay | Admitting: Surgery

## 2023-10-02 ENCOUNTER — Emergency Department (HOSPITAL_COMMUNITY): Payer: BC Managed Care – PPO

## 2023-10-02 ENCOUNTER — Emergency Department (HOSPITAL_COMMUNITY)
Admission: EM | Admit: 2023-10-02 | Discharge: 2023-10-02 | Disposition: A | Discharge status: Home / Self Care | Payer: BC Managed Care – PPO | Attending: Emergency Medicine | Admitting: Emergency Medicine

## 2023-10-02 ENCOUNTER — Other Ambulatory Visit: Payer: Self-pay

## 2023-10-02 ENCOUNTER — Encounter (HOSPITAL_COMMUNITY): Payer: Self-pay

## 2023-10-02 DIAGNOSIS — N400 Enlarged prostate without lower urinary tract symptoms: Secondary | ICD-10-CM | POA: Diagnosis not present

## 2023-10-02 DIAGNOSIS — J9811 Atelectasis: Secondary | ICD-10-CM | POA: Diagnosis not present

## 2023-10-02 DIAGNOSIS — R1030 Lower abdominal pain, unspecified: Secondary | ICD-10-CM

## 2023-10-02 DIAGNOSIS — I10 Essential (primary) hypertension: Secondary | ICD-10-CM | POA: Insufficient documentation

## 2023-10-02 DIAGNOSIS — R109 Unspecified abdominal pain: Secondary | ICD-10-CM | POA: Diagnosis not present

## 2023-10-02 DIAGNOSIS — R0989 Other specified symptoms and signs involving the circulatory and respiratory systems: Secondary | ICD-10-CM | POA: Diagnosis not present

## 2023-10-02 DIAGNOSIS — E876 Hypokalemia: Secondary | ICD-10-CM | POA: Insufficient documentation

## 2023-10-02 DIAGNOSIS — Z79899 Other long term (current) drug therapy: Secondary | ICD-10-CM | POA: Insufficient documentation

## 2023-10-02 DIAGNOSIS — R1033 Periumbilical pain: Secondary | ICD-10-CM | POA: Insufficient documentation

## 2023-10-02 DIAGNOSIS — K5730 Diverticulosis of large intestine without perforation or abscess without bleeding: Secondary | ICD-10-CM | POA: Diagnosis not present

## 2023-10-02 DIAGNOSIS — K3580 Unspecified acute appendicitis: Secondary | ICD-10-CM | POA: Diagnosis not present

## 2023-10-02 DIAGNOSIS — R1110 Vomiting, unspecified: Secondary | ICD-10-CM | POA: Insufficient documentation

## 2023-10-02 LAB — URINALYSIS, ROUTINE W REFLEX MICROSCOPIC
Bacteria, UA: NONE SEEN
Bilirubin Urine: NEGATIVE
Glucose, UA: NEGATIVE mg/dL
Ketones, ur: NEGATIVE mg/dL
Leukocytes,Ua: NEGATIVE
Nitrite: NEGATIVE
Protein, ur: NEGATIVE mg/dL
Specific Gravity, Urine: 1.013 (ref 1.005–1.030)
pH: 5 (ref 5.0–8.0)

## 2023-10-02 LAB — CBC
HCT: 52.9 % — ABNORMAL HIGH (ref 39.0–52.0)
Hemoglobin: 17.7 g/dL — ABNORMAL HIGH (ref 13.0–17.0)
MCH: 29.4 pg (ref 26.0–34.0)
MCHC: 33.5 g/dL (ref 30.0–36.0)
MCV: 87.7 fL (ref 80.0–100.0)
Platelets: 214 10*3/uL (ref 150–400)
RBC: 6.03 MIL/uL — ABNORMAL HIGH (ref 4.22–5.81)
RDW: 13.2 % (ref 11.5–15.5)
WBC: 5.6 10*3/uL (ref 4.0–10.5)
nRBC: 0 % (ref 0.0–0.2)

## 2023-10-02 LAB — COMPREHENSIVE METABOLIC PANEL
ALT: 30 U/L (ref 0–44)
AST: 27 U/L (ref 15–41)
Albumin: 4.1 g/dL (ref 3.5–5.0)
Alkaline Phosphatase: 73 U/L (ref 38–126)
Anion gap: 12 (ref 5–15)
BUN: 15 mg/dL (ref 6–20)
CO2: 24 mmol/L (ref 22–32)
Calcium: 9.4 mg/dL (ref 8.9–10.3)
Chloride: 102 mmol/L (ref 98–111)
Creatinine, Ser: 1.33 mg/dL — ABNORMAL HIGH (ref 0.61–1.24)
GFR, Estimated: >60 mL/min (ref >60)
Glucose, Bld: 119 mg/dL — ABNORMAL HIGH (ref 70–99)
Potassium: 2.8 mmol/L — ABNORMAL LOW (ref 3.5–5.1)
Sodium: 138 mmol/L (ref 135–145)
Total Bilirubin: 0.4 mg/dL (ref <1.2)
Total Protein: 7.5 g/dL (ref 6.5–8.1)

## 2023-10-02 LAB — LIPASE, BLOOD: Lipase: 23 U/L (ref 11–51)

## 2023-10-02 LAB — TROPONIN I (HIGH SENSITIVITY): Troponin I (High Sensitivity): 6 ng/L (ref <18)

## 2023-10-02 MED ORDER — ONDANSETRON 4 MG PO TBDP: ORAL_TABLET | ORAL | 0 refills | Status: DC

## 2023-10-02 MED ORDER — POTASSIUM CHLORIDE 10 MEQ/100ML IV SOLN
10.0000 meq | Freq: Once | INTRAVENOUS | Status: AC
Start: 2023-10-02 — End: 2023-10-02
  Administered 2023-10-02: 10 meq via INTRAVENOUS
  Filled 2023-10-02: qty 100

## 2023-10-02 MED ORDER — OXYCODONE-ACETAMINOPHEN 5-325 MG PO TABS
1.0000 | ORAL_TABLET | ORAL | Status: DC | PRN
  Administered 2023-10-02: 1 via ORAL
  Filled 2023-10-02: qty 1

## 2023-10-02 MED ORDER — ONDANSETRON 4 MG PO TBDP
4.0000 mg | ORAL_TABLET | Freq: Once | ORAL | Status: AC | PRN
  Administered 2023-10-02: 4 mg via ORAL
  Filled 2023-10-02: qty 1

## 2023-10-02 MED ORDER — POTASSIUM CHLORIDE CRYS ER 20 MEQ PO TBCR: 20.0000 meq | EXTENDED_RELEASE_TABLET | Freq: Every day | ORAL | 0 refills | Status: DC

## 2023-10-02 MED ORDER — PANTOPRAZOLE SODIUM 40 MG IV SOLR
40.0000 mg | Freq: Once | INTRAVENOUS | Status: AC
  Administered 2023-10-02: 40 mg via INTRAVENOUS
  Filled 2023-10-02: qty 10

## 2023-10-02 MED ORDER — HYDROMORPHONE HCL 1 MG/ML IJ SOLN
0.5000 mg | Freq: Once | INTRAMUSCULAR | Status: AC
  Administered 2023-10-02: 0.5 mg via INTRAVENOUS
  Filled 2023-10-02: qty 1

## 2023-10-02 MED ORDER — IOHEXOL 350 MG/ML SOLN
90.0000 mL | Freq: Once | INTRAVENOUS | Status: AC | PRN
  Administered 2023-10-02: 90 mL via INTRAVENOUS

## 2023-10-02 MED ORDER — DICYCLOMINE HCL 20 MG PO TABS: ORAL_TABLET | ORAL | 0 refills | Status: DC

## 2023-10-02 MED ORDER — SODIUM CHLORIDE 0.9 % IV BOLUS
1000.0000 mL | Freq: Once | INTRAVENOUS | Status: AC
  Administered 2023-10-02: 1000 mL via INTRAVENOUS

## 2023-10-02 NOTE — ED Triage Notes (Signed)
Reports severe abd pain that started yesterday. Complains of burning sensation in epigastric stomach region. Reports vomiting x 3 times at home.  Last he ate precooked hamburger last night.  LBM this am.

## 2023-10-02 NOTE — ED Provider Notes (Signed)
[place] EMERGENCY DEPARTMENT AT [facility] Provider Note   CSN: [id number] Arrival date & time: 10/02/23  1829     History {Add pertinent medical, surgical, social history, OB history to HPI:1} Chief Complaint  Patient presents with   Abdominal Pain    Bryan Riggs is a 57 y.o. male.  Patient complains of abdominal pain and vomiting since yesterday.  No fever no chills  The history is provided by the patient and medical records. No language interpreter was used.  Abdominal Pain Pain location:  Periumbilical Pain quality: aching   Pain radiates to:  Does not radiate Pain severity:  Moderate Onset quality:  Sudden Timing:  Constant Progression:  Waxing and waning Chronicity:  New Context: not alcohol use   Relieved by:  Nothing Worsened by:  Nothing Associated symptoms: vomiting   Associated symptoms: no chest pain, no cough, no diarrhea, no fatigue and no hematuria        Home Medications Prior to Admission medications   Medication Sig Start Date End Date Taking? Authorizing Provider  dicyclomine (BENTYL) 20 MG tablet Take 1 every 6-8 hours for abdominal cramping 10/02/23  Yes Bethann Berkshire, MD  ondansetron (ZOFRAN-ODT) 4 MG disintegrating tablet 4mg  ODT q4 hours prn nausea/vomit 10/02/23  Yes Bethann Berkshire, MD  amLODipine (NORVASC) 10 MG tablet Take 1 tablet (10 mg total) by mouth daily. Please keep upcoming appt with Dr. Katrinka Blazing in November 2022 before anymore refills. Thank you Final attempt 05/13/21   Lyn Records, MD  bacitracin-polymyxin b (POLYSPORIN) ophthalmic ointment Place 1 Application into the right eye every 12 (twelve) hours. apply to eye every 12 hours while awake 03/24/23   Piontek, Denny Peon, MD  lisinopril-hydrochlorothiazide (ZESTORETIC) 20-25 MG tablet Take 1 tablet by mouth daily. 07/29/21   [provider]  metoprolol succinate (TOPROL-XL) 50 MG 24 hr tablet TAKE 1/2 TABLET(25 MG) BY MOUTH DAILY WITH OR IMMEDIATELY FOLLOWING A  MEAL 10/26/22   Lyn Records, MD  tadalafil (CIALIS) 20 MG tablet Take 10-20 mg by mouth daily as needed.  10/26/19   [provider]      Allergies    Sulfasalazine, Sulfa antibiotics, and Viagra [sildenafil citrate]    Review of Systems   Review of Systems  Constitutional:  Negative for appetite change and fatigue.  HENT:  Negative for congestion, ear discharge and sinus pressure.   Eyes:  Negative for discharge.  Respiratory:  Negative for cough.   Cardiovascular:  Negative for chest pain.  Gastrointestinal:  Positive for abdominal pain and vomiting. Negative for diarrhea.  Genitourinary:  Negative for frequency and hematuria.  Musculoskeletal:  Negative for back pain.  Skin:  Negative for rash.  Neurological:  Negative for seizures and headaches.  Psychiatric/Behavioral:  Negative for hallucinations.     Physical Exam Updated Vital Signs BP (!) 165/97 (BP Location: Right Arm)   Pulse 82   Temp 98.3 F (36.8 C)   Resp 20   Ht 6' (1.829 m)   Wt 121.1 kg   SpO2 98%   BMI 36.21 kg/m  Physical Exam Vitals and nursing note reviewed.  Constitutional:      Appearance: He is well-developed.  HENT:     Head: Normocephalic.     Mouth/Throat:     Mouth: Mucous membranes are moist.  Eyes:     General: No scleral icterus.    Conjunctiva/sclera: Conjunctivae normal.  Neck:     Thyroid: No thyromegaly.  Cardiovascular:     Rate  and Rhythm: Normal rate and regular rhythm.     Heart sounds: No murmur heard.    No friction rub. No gallop.  Pulmonary:     Breath sounds: No stridor. No wheezing or rales.  Chest:     Chest wall: No tenderness.  Abdominal:     General: There is no distension.     Tenderness: There is abdominal tenderness. There is no rebound.     Comments: Mild periumbilical tenderness  Musculoskeletal:        General: Normal range of motion.     Cervical back: Neck supple.  Lymphadenopathy:     Cervical: No cervical adenopathy.  Skin:     Findings: No erythema or rash.  Neurological:     Mental Status: He is alert and oriented to person, place, and time.     Motor: No abnormal muscle tone.     Coordination: Coordination normal.  Psychiatric:        Behavior: Behavior normal.     ED Results / Procedures / Treatments   Labs (all labs ordered are listed, but only abnormal results are displayed) Labs Reviewed  COMPREHENSIVE METABOLIC PANEL - Abnormal; Notable for the following components:      Result Value   Potassium 2.8 (*)    Glucose, Bld 119 (*)    Creatinine, Ser 1.33 (*)    All other components within normal limits  CBC - Abnormal; Notable for the following components:   RBC 6.03 (*)    Hemoglobin 17.7 (*)    HCT 52.9 (*)    All other components within normal limits  URINALYSIS, ROUTINE W REFLEX MICROSCOPIC - Abnormal; Notable for the following components:   Hgb urine dipstick SMALL (*)    All other components within normal limits  LIPASE, BLOOD  TROPONIN I (HIGH SENSITIVITY)  TROPONIN I (HIGH SENSITIVITY)    EKG None  Radiology CT ABDOMEN PELVIS W CONTRAST  Result Date: 10/02/2023 CLINICAL DATA:  One day history of severe abdominal pain with burning sensation in the epigastric area associated with vomiting EXAM: CT ABDOMEN AND PELVIS WITH CONTRAST TECHNIQUE: Multidetector CT imaging of the abdomen and pelvis was performed using the standard protocol following bolus administration of intravenous contrast. RADIATION DOSE REDUCTION: This exam was performed according to the departmental dose-optimization program which includes automated exposure control, adjustment of the mA and/or kV according to patient size and/or use of iterative reconstruction technique. CONTRAST:  90mL OMNIPAQUE IOHEXOL 350 MG/ML SOLN COMPARISON:  CTA abdomen and pelvis dated 12/18/2019 FINDINGS: Lower chest: No focal consolidation or pulmonary nodule in the lung bases. No pleural effusion or pneumothorax demonstrated. Partially imaged  heart size is normal. Hepatobiliary: Scattered subcentimeter hypodensities, too small to characterize. No intra or extrahepatic biliary ductal dilation. Normal gallbladder. Pancreas: No focal lesions or main ductal dilation. Spleen: Normal in size without focal abnormality. Adrenals/Urinary Tract: No adrenal nodules. No suspicious renal mass or hydronephrosis. Punctate nonobstructing right upper pole stone. No focal bladder wall thickening. Stomach/Bowel: Normal appearance of the stomach. No evidence of bowel wall thickening, distention, or inflammatory changes. Colonic diverticulosis without acute diverticulitis. Slight increase in size of the appendiceal tip measuring 9 mm (3:53), previously 7 mm, upstream of a small focus of intraluminal hyperattenuation (3:55). Vascular/Lymphatic: No significant vascular findings are present. No enlarged abdominal or pelvic lymph nodes. Reproductive: Enlargement of the prostate with median lobe hypertrophy. Other: No free fluid, fluid collection, or free air. Musculoskeletal: No acute or abnormal lytic or blastic osseous lesions. Multilevel  degenerative changes of the partially imaged thoracic and lumbar spine. IMPRESSION: 1. Slight increase in size of the appendiceal tip measuring 9 mm, previously 7 mm, upstream of a small focus of intraluminal hyperattenuation. Findings are equivocal for early acute appendicitis. 2. Otherwise, no acute infectious/inflammatory finding in the abdomen or pelvis. 3. Punctate nonobstructing right upper pole stone. 4. Colonic diverticulosis without acute diverticulitis. 5. Enlargement of the prostate with median lobe hypertrophy. Electronically Signed   By: Agustin Cree M.D.   On: 10/02/2023 12:45   DG Chest 2 View  Result Date: 10/02/2023 CLINICAL DATA:  Abdominal pain EXAM: CHEST - 2 VIEW COMPARISON:  12/18/2019 FINDINGS: The heart size and mediastinal contours are within normal limits. Slightly low lung volumes with mild streaky bibasilar  atelectasis. Lungs are otherwise clear. No pleural effusion or pneumothorax. The visualized skeletal structures are unremarkable. IMPRESSION: Slightly low lung volumes with mild bibasilar atelectasis. Electronically Signed   By: Duanne Guess D.O.   On: 10/02/2023 10:50    Procedures Procedures  {Document cardiac monitor, telemetry assessment procedure when appropriate:1}  Medications Ordered in ED Medications  oxyCODONE-acetaminophen (PERCOCET/ROXICET) 5-325 MG per tablet 1 tablet (1 tablet Oral Given 10/02/23 1021)  ondansetron (ZOFRAN-ODT) disintegrating tablet 4 mg (4 mg Oral Given 10/02/23 1022)  sodium chloride 0.9 % bolus 1,000 mL (0 mLs Intravenous Stopped 10/02/23 1401)  pantoprazole (PROTONIX) injection 40 mg (40 mg Intravenous Given 10/02/23 1150)  HYDROmorphone (DILAUDID) injection 0.5 mg (0.5 mg Intravenous Given 10/02/23 1151)  potassium chloride 10 mEq in 100 mL IVPB (0 mEq Intravenous Stopped 10/02/23 1401)  iohexol (OMNIPAQUE) 350 MG/ML injection 90 mL (90 mLs Intravenous Contrast Given 10/02/23 1207)    ED Course/ Medical Decision Making/ A&P   {Patient does not look toxic CBC shows white count 5.6, CT of abdomen does not show acute appendicitis.  There is some question of early appendicitis.  I spoke with general surgery and they reviewed the films and felt like the patient did not have appendicitis and could be discharged home with close follow-up Click here for ABCD2, HEART and other calculatorsREFRESH Note before signing :1}                              Medical Decision Making Amount and/or Complexity of Data Reviewed Labs: ordered. Radiology: ordered.  Risk Prescription drug management.   Abdominal pain and vomiting.  Patient is placed on Bentyl and Zofran.  He is also given potassium for his hypokalemia and he will follow-up with his PCP in the next couple days  {Document critical care time when appropriate:1} {Document review of labs and clinical decision  tools ie heart score, Chads2Vasc2 etc:1}  {Document your independent review of radiology images, and any outside records:1} {Document your discussion with family members, caretakers, and with consultants:1} {Document social determinants of health affecting pt's care:1} {Document your decision making why or why not admission, treatments were needed:1} Final Clinical Impression(s) / ED Diagnoses Final diagnoses:  Lower abdominal pain    Rx / DC Orders ED Discharge Orders          Ordered    dicyclomine (BENTYL) 20 MG tablet        10/02/23 1403    ondansetron (ZOFRAN-ODT) 4 MG disintegrating tablet        10/02/23 1403

## 2023-10-02 NOTE — Discharge Instructions (Signed)
Take liquids only for the next couple days.  Follow-up with your family doctor for recheck Monday or Tuesday.  If the pain becomes much worse return to the emergency department for further evaluation

## 2023-10-04 ENCOUNTER — Ambulatory Visit
Admission: RE | Admit: 2023-10-04 | Discharge: 2023-10-04 | Disposition: A | Discharge status: Home / Self Care | Payer: BC Managed Care – PPO | Source: Ambulatory Visit | Attending: Surgery | Admitting: Surgery

## 2023-10-04 DIAGNOSIS — Q2543 Congenital aneurysm of aorta: Secondary | ICD-10-CM | POA: Diagnosis not present

## 2023-10-04 MED ORDER — IOPAMIDOL (ISOVUE-370) INJECTION 76%
500.0000 mL | Freq: Once | INTRAVENOUS | Status: AC | PRN
  Administered 2023-10-04: 75 mL via INTRAVENOUS

## 2023-10-12 ENCOUNTER — Ambulatory Visit (INDEPENDENT_AMBULATORY_CARE_PROVIDER_SITE_OTHER): Payer: BC Managed Care – PPO | Admitting: Surgery

## 2023-10-12 ENCOUNTER — Encounter: Payer: Self-pay | Admitting: Surgery

## 2023-10-12 VITALS — BP 138/87 | HR 82 | Resp 18 | Ht 72.0 in | Wt 264.0 lb

## 2023-10-12 DIAGNOSIS — Q2543 Congenital aneurysm of aorta: Secondary | ICD-10-CM

## 2023-10-12 NOTE — Progress Notes (Signed)
HPI:  The patient is a 57 year old gentleman who has been followed with a stable 4.6 to 4.7 cm aortic root aneurysm dating back to 2012.  I last saw him on 10/14/2021 at which time CT scan showed the measurement to be 4.7 cm.  A 2D echocardiogram in February 2021 showed a normal trileaflet aortic valve without insufficiency.  He continues to feel well overall but is still trying to recover from a left knee replacement.  Current Outpatient Medications  Medication Sig Dispense Refill   amLODipine (NORVASC) 10 MG tablet Take 1 tablet (10 mg total) by mouth daily. Please keep upcoming appt with Dr. Katrinka Blazing in November 2022 before anymore refills. Thank you Final attempt 90 tablet 1   lisinopril-hydrochlorothiazide (ZESTORETIC) 20-25 MG tablet Take 1 tablet by mouth daily.     metoprolol succinate (TOPROL-XL) 50 MG 24 hr tablet TAKE 1/2 TABLET(25 MG) BY MOUTH DAILY WITH OR IMMEDIATELY FOLLOWING A MEAL 45 tablet 3   tadalafil (CIALIS) 20 MG tablet Take 10-20 mg by mouth daily as needed.      No current facility-administered medications for this visit.     Physical Exam: BP 138/87 (BP Location: Right Arm, Patient Position: Sitting)   Pulse 82   Resp 18   Ht 6' (1.829 m)   Wt 264 lb (119.7 kg)   SpO2 95% Comment: RA  BMI 35.80 kg/m  He looks well. Cardiac exam shows a regular rate and rhythm with normal heart sounds.  There is no murmur. Lungs are clear.  Diagnostic Tests:  Narrative & Impression  CLINICAL DATA:  History of dilated aortic root with sinus of Valsalva aneurysm.   EXAM: CT ANGIOGRAPHY CHEST WITH CONTRAST   TECHNIQUE: Multidetector CT imaging of the chest was performed using the standard protocol during bolus administration of intravenous contrast. Multiplanar CT image reconstructions and MIPs were obtained to evaluate the vascular anatomy.   RADIATION DOSE REDUCTION: This exam was performed according to the departmental dose-optimization program which includes  automated exposure control, adjustment of the mA and/or kV according to patient size and/or use of iterative reconstruction technique.   CONTRAST:  75mL ISOVUE-370 IOPAMIDOL (ISOVUE-370) INJECTION 76%   COMPARISON:  10/07/2021   FINDINGS: Cardiovascular: Stable aortic measurements since the prior study with dilated aortic root again measuring approximately 4.5-4.7 cm at the level of the sinuses of Valsalva. The ascending thoracic aorta measures up to 3.8 cm. The aortic arch measures 3.5 cm proximally and 2.8 cm distally. The descending thoracic aorta measures 2.6 cm. No evidence of aortic dissection. 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   Stable and normal heart size. No pericardial fluid identified. No significant calcified coronary artery plaque. Central pulmonary arteries are normal in caliber.   Mediastinum/Nodes: No enlarged mediastinal, hilar, or axillary lymph nodes. Thyroid gland, trachea, and esophagus demonstrate no significant findings.   Lungs/Pleura: Mild new scarring/atelectasis at the right lung base. There is no evidence of pulmonary edema, consolidation, pneumothorax, nodule or pleural fluid.   Upper Abdomen: No acute abnormality.   Musculoskeletal: No chest wall abnormality. No acute or significant osseous findings.   Review of the MIP images confirms the above findings.   IMPRESSION: 1. Stable aortic measurements since the prior study with dilated aortic root again measuring approximately 4.5-4.7 cm at the level of the sinuses of Valsalva. 2. 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 3. Mild new scarring/atelectasis at the right lung base.   Aortic aneurysm NOS (ICD10-I71.9).     Electronically Signed   By: Irish Lack M.D.   On: 10/04/2023 12:26      Impression:  He has a stable 4.5 to 4.7 cm aortic root aneurysm which is essentially unchanged dating back to 2012.  He has a normal trileaflet aortic valve without insufficiency.  His aneurysm is still well below the surgical threshold of 5.5 cm.  I reviewed the CT images with him and answered all of his questions.  I stressed the importance of continued good blood pressure control in preventing further enlargement and acute aortic dissection.  I advised him against doing any heavy lifting that may require a Valsalva maneuver and could suddenly raise his blood pressure to high levels.  Plan: I will plan to see him back in 2 years with a CTA of the chest for aortic surveillance.  I spent 15 minutes performing this established patient evaluation and > 50% of this time was spent face to face counseling and coordinating the care of this patient's aortic aneurysm.    Alleen Borne, MD [facility] [phone]

## 2023-12-08 DIAGNOSIS — M1712 Unilateral primary osteoarthritis, left knee: Secondary | ICD-10-CM | POA: Diagnosis not present

## 2023-12-28 ENCOUNTER — Encounter (HOSPITAL_COMMUNITY): Payer: Self-pay | Admitting: *Deleted

## 2023-12-28 ENCOUNTER — Ambulatory Visit (HOSPITAL_COMMUNITY)
Admission: EM | Admit: 2023-12-28 | Discharge: 2023-12-28 | Disposition: A | Discharge status: Home / Self Care | Payer: BC Managed Care – PPO | Attending: Physician Assistant | Admitting: Physician Assistant

## 2023-12-28 DIAGNOSIS — J069 Acute upper respiratory infection, unspecified: Secondary | ICD-10-CM | POA: Diagnosis not present

## 2023-12-28 LAB — POCT INFLUENZA A/B
Influenza A, POC: NEGATIVE
Influenza B, POC: NEGATIVE

## 2023-12-28 NOTE — ED Triage Notes (Signed)
Pt states he has body aches, cough, eyes tender, headache X 2 days. Pt has been taking mucinex DM

## 2023-12-28 NOTE — Discharge Instructions (Addendum)
Your flu testing was negative today.  At this time I suspect you likely have an upper respiratory infection or a cold. Based on your described symptoms and the duration of symptoms it is likely that you have a viral upper respiratory infection (often called a "cold")  Symptoms can last for 3-10 days with lingering cough and intermittent symptoms lasting weeks after that.  The goal of treatment at this time is to reduce your symptoms and discomfort   I recommend using Robitussin and Mucinex (regular formulations, nothing with decongestants or DM)  You can also use Tylenol for body aches and fever reduction I also recommend adding an antihistamine to your daily regimen This includes medications like Claritin, Allegra, Zyrtec- the generics of these work very well and are usually less expensive I recommend using Flonase nasal spray - 2 puffs twice per day to help with your nasal congestion The antihistamines and Flonase can take a few weeks to provide significant relief from allergy symptoms but should start to provide some benefit soon. You can use a humidifier at night to help with preventing nasal dryness and irritation   If your symptoms do not improve or become worse in the next 5-7 days please make an apt at the office so we can see you  Go to the ER if you begin to have more serious symptoms such as shortness of breath, trouble breathing, loss of consciousness, swelling around the eyes, high fever, severe lasting headaches, vision changes or neck pain/stiffness.

## 2023-12-28 NOTE — ED Provider Notes (Signed)
[facility]    CSN: [id number] Arrival date & time: 12/28/23  1914      History   Chief Complaint Chief Complaint  Patient presents with   Cough   Headache   Eye Problem   Generalized Body Aches    HPI Bryan Riggs is a 58 y.o. male.   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 Authorizing Provider  amLODipine (NORVASC) 10 MG tablet Take 1 tablet (10 mg total) by mouth daily. Please keep upcoming appt with Dr. Katrinka Blazing in November 2022 before anymore refills. Thank you Final attempt 05/13/21  Yes Lyn Records, MD   lisinopril-hydrochlorothiazide (ZESTORETIC) 20-25 MG tablet Take 1 tablet by mouth daily. 07/29/21  Yes [provider]  metoprolol succinate (TOPROL-XL) 50 MG 24 hr tablet TAKE 1/2 TABLET(25 MG) BY MOUTH DAILY WITH OR IMMEDIATELY FOLLOWING A MEAL 10/26/22  Yes Lyn Records, MD  tadalafil (CIALIS) 20 MG tablet Take 10-20 mg by mouth daily as needed.  10/26/19   [provider]    Family History Family History  Problem Relation Age of Onset   Cancer Mother        BREAST CANCER   Cancer Father        MUTIPLE MYELOMA   Hypertension Father    Cancer Brother        ? TYPE    Social History Social History   Tobacco Use   Smoking status: Never   Smokeless tobacco: Never  Vaping Use   Vaping status: Never Used  Substance Use Topics   Alcohol use: Yes    Comment: RARE   Drug use: No     Allergies   Sulfasalazine, Sulfa antibiotics, and Viagra [sildenafil citrate]   Review of Systems Review of Systems  Constitutional:  Positive for chills  and fatigue. Negative for fever.  HENT:  Positive for postnasal drip. Negative for congestion, ear pain, rhinorrhea, sinus pressure, sinus pain and sore throat.   Respiratory:  Positive for cough. Negative for shortness of breath and wheezing.   Gastrointestinal:  Negative for diarrhea, nausea and vomiting.  Musculoskeletal:  Positive for myalgias.  Neurological:  Positive for headaches. Negative for dizziness and light-headedness.     Physical Exam Triage Vital Signs ED Triage Vitals  Encounter Vitals Group     BP 12/28/23 0845 (!) 141/92     Systolic BP Percentile --      Diastolic BP Percentile --      Pulse Rate 12/28/23 0845 72     Resp 12/28/23 0845 18     Temp 12/28/23 0845 98.1 F (36.7 C)     Temp Source 12/28/23 0845 Oral     SpO2 12/28/23 0845 97 %     Weight --      Height --      Head Circumference --      Peak Flow --      Pain Score 12/28/23 0843 5     Pain Loc --      Pain Education --       Exclude from Growth Chart --    No data found.  Updated Vital Signs BP (!) 141/92 (BP Location: Right Arm)   Pulse 72   Temp 98.1 F (36.7 C) (Oral)   Resp 18   SpO2 97%   Visual Acuity Right Eye Distance:   Left Eye Distance:   Bilateral Distance:    Right Eye Near:   Left Eye Near:    Bilateral Near:     Physical Exam Vitals reviewed.  Constitutional:      General: He is awake.     Appearance: Normal appearance. He is well-developed and well-groomed.  HENT:     Head: Normocephalic and atraumatic.     Right Ear: Hearing, tympanic membrane and ear canal normal.     Left Ear: Hearing, tympanic membrane and ear canal normal.     Mouth/Throat:     Lips: Pink.     Mouth: Mucous membranes are moist.     Pharynx: Oropharynx is clear. Uvula midline. No pharyngeal swelling, oropharyngeal exudate, posterior oropharyngeal erythema, uvula swelling or postnasal drip.     Tonsils: No tonsillar exudate or tonsillar abscesses.  Eyes:     General: Lids are normal. Gaze aligned appropriately.     Extraocular Movements: Extraocular movements intact.  Cardiovascular:     Rate and Rhythm: Normal rate and regular rhythm.     Heart sounds: Normal heart sounds.  Pulmonary:     Effort: Pulmonary effort is normal.     Breath sounds: Normal breath sounds. No decreased air movement. No decreased breath sounds, wheezing, rhonchi or rales.  Musculoskeletal:     Cervical back: Normal range of motion and neck supple.  Lymphadenopathy:     Head:     Right side of head: No submental, submandibular or preauricular adenopathy.     Left side of head: No submental, submandibular or preauricular adenopathy.     Cervical:     Right cervical: No superficial cervical adenopathy.    Left cervical: No superficial cervical adenopathy.     Upper Body:     Right upper body: No supraclavicular adenopathy.     Left upper body: No supraclavicular adenopathy.  Skin:    General: Skin is warm and dry.  Neurological:     General: No focal deficit present.     Mental Status: He is alert and oriented to person, place, and time.     GCS: GCS eye subscore is 4. GCS verbal subscore is 5. GCS motor subscore is 6.  Psychiatric:        Mood and Affect: Mood normal.        Speech: Speech normal.        Behavior: Behavior normal. Behavior is cooperative.        Thought Content: Thought content normal.        Judgment: Judgment normal.      UC Treatments / Results  Labs (all labs ordered are listed, but only abnormal results are displayed) Labs Reviewed  POCT INFLUENZA A/B    EKG   Radiology No results found.  Procedures Procedures (including critical care time)  Medications Ordered in UC Medications - No data to display  Initial Impression / Assessment and Plan / UC Course  I have reviewed the triage vital signs and the nursing notes.  Pertinent labs & imaging results that were available during my care of the patient were reviewed by me and considered in my medical decision making (see chart for details).      Final Clinical Impressions(s) / UC Diagnoses   Final diagnoses:  Viral upper respiratory tract infection   Acute, new concern Patient presents today with bodyaches, cough, facial tenderness, headache for the past 2 days.  He has been taking Mucinex DM but reports that he is still feeling fatigued.  Rapid flu testing was negative.  Results reviewed with patient at the end of appointment.  Given the presentation and symptoms as well as overall reassuring physical exam and vitals I suspect that he likely has viral upper respiratory infection.  Recommend symptomatic treatment with over-the-counter medications.  These were reviewed and provided in after visit summary.  ED and return precautions reviewed and provided in after visit summary as well.  Follow-up as needed    Discharge Instructions      Your flu testing was negative today.  At this time I suspect you likely  have an upper respiratory infection or a cold. Based on your described symptoms and the duration of symptoms it is likely that you have a viral upper respiratory infection (often called a "cold")  Symptoms can last for 3-10 days with lingering cough and intermittent symptoms lasting weeks after that.  The goal of treatment at this time is to reduce your symptoms and discomfort   I recommend using Robitussin and Mucinex (regular formulations, nothing with decongestants or DM)  You can also use Tylenol for body aches and fever reduction I also recommend adding an antihistamine to your daily regimen This includes medications like Claritin, Allegra, Zyrtec- the generics of these work very well and are usually less expensive I recommend using Flonase nasal spray - 2 puffs twice per day to help with your nasal congestion The antihistamines and Flonase can take a few weeks to provide significant relief from allergy symptoms but should start to provide some benefit soon. You can use a humidifier at night to help with preventing nasal dryness and irritation   If your symptoms do not improve or become worse in the next 5-7 days please make an apt at the office so we can see you  Go to the ER if you begin to have more serious symptoms such as shortness of breath, trouble breathing, loss of consciousness, swelling around  the eyes, high fever, severe lasting headaches, vision changes or neck pain/stiffness.       ED Prescriptions   None    PDMP not reviewed this encounter.   Roselind Messier 12/28/23 [id number]

## 2024-01-31 DIAGNOSIS — I129 Hypertensive chronic kidney disease with stage 1 through stage 4 chronic kidney disease, or unspecified chronic kidney disease: Secondary | ICD-10-CM | POA: Diagnosis not present

## 2024-01-31 DIAGNOSIS — N183 Chronic kidney disease, stage 3 unspecified: Secondary | ICD-10-CM | POA: Diagnosis not present

## 2024-03-15 ENCOUNTER — Other Ambulatory Visit: Payer: Self-pay

## 2024-03-15 MED ORDER — METOPROLOL SUCCINATE ER 50 MG PO TB24
ORAL_TABLET | ORAL | 0 refills | Status: AC
Start: 2024-03-15 — End: ?

## 2024-06-19 DIAGNOSIS — E7800 Pure hypercholesterolemia, unspecified: Secondary | ICD-10-CM | POA: Diagnosis not present

## 2024-06-19 DIAGNOSIS — Z1159 Encounter for screening for other viral diseases: Secondary | ICD-10-CM | POA: Diagnosis not present

## 2024-06-19 DIAGNOSIS — N183 Chronic kidney disease, stage 3 unspecified: Secondary | ICD-10-CM | POA: Diagnosis not present

## 2024-06-19 DIAGNOSIS — I10 Essential (primary) hypertension: Secondary | ICD-10-CM | POA: Diagnosis not present

## 2024-06-19 DIAGNOSIS — R972 Elevated prostate specific antigen [PSA]: Secondary | ICD-10-CM | POA: Diagnosis not present

## 2024-06-21 DIAGNOSIS — Z0189 Encounter for other specified special examinations: Secondary | ICD-10-CM | POA: Diagnosis not present

## 2024-06-21 DIAGNOSIS — R972 Elevated prostate specific antigen [PSA]: Secondary | ICD-10-CM | POA: Diagnosis not present

## 2024-06-21 DIAGNOSIS — I10 Essential (primary) hypertension: Secondary | ICD-10-CM | POA: Diagnosis not present

## 2024-06-21 DIAGNOSIS — N183 Chronic kidney disease, stage 3 unspecified: Secondary | ICD-10-CM | POA: Diagnosis not present

## 2024-06-21 DIAGNOSIS — E7800 Pure hypercholesterolemia, unspecified: Secondary | ICD-10-CM | POA: Diagnosis not present
